# Patient Record
Sex: Male | Born: 1970
Health system: Southern US, Community
[De-identification: ages and names within clinical notes are randomized; demographics above are authoritative.]

## PROBLEM LIST (undated history)

## (undated) DIAGNOSIS — E785 Hyperlipidemia, unspecified: Secondary | ICD-10-CM

## (undated) DIAGNOSIS — B019 Varicella without complication: Secondary | ICD-10-CM

## (undated) DIAGNOSIS — G4730 Sleep apnea, unspecified: Secondary | ICD-10-CM

## (undated) DIAGNOSIS — I10 Essential (primary) hypertension: Secondary | ICD-10-CM

## (undated) DIAGNOSIS — E119 Type 2 diabetes mellitus without complications: Secondary | ICD-10-CM

## (undated) HISTORY — DX: Varicella without complication: B01.9

## (undated) HISTORY — PX: HERNIA REPAIR: SHX51

## (undated) HISTORY — DX: Sleep apnea, unspecified: G47.30

## (undated) HISTORY — DX: Hyperlipidemia, unspecified: E78.5

## (undated) HISTORY — PX: SLIPPED CAPITAL FEMORAL EPIPHYSIS PINNING: SHX391

## (undated) HISTORY — DX: Type 2 diabetes mellitus without complications: E11.9

---

## 2000-08-30 ENCOUNTER — Encounter: Payer: Self-pay | Admitting: Emergency Medicine

## 2000-08-30 ENCOUNTER — Emergency Department (HOSPITAL_COMMUNITY): Admission: EM | Admit: 2000-08-30 | Discharge: 2000-08-30 | Payer: Self-pay | Admitting: Emergency Medicine

## 2012-12-17 ENCOUNTER — Ambulatory Visit (INDEPENDENT_AMBULATORY_CARE_PROVIDER_SITE_OTHER): Payer: BC Managed Care – PPO | Admitting: Emergency Medicine

## 2012-12-17 ENCOUNTER — Ambulatory Visit: Payer: BC Managed Care – PPO

## 2012-12-17 VITALS — BP 160/98 | HR 114 | Temp 98.8°F | Resp 18 | Ht 70.0 in | Wt 261.0 lb

## 2012-12-17 DIAGNOSIS — R059 Cough, unspecified: Secondary | ICD-10-CM

## 2012-12-17 DIAGNOSIS — R05 Cough: Secondary | ICD-10-CM

## 2012-12-17 DIAGNOSIS — J329 Chronic sinusitis, unspecified: Secondary | ICD-10-CM

## 2012-12-17 LAB — POCT RAPID STREP A (OFFICE): Rapid Strep A Screen: NEGATIVE

## 2012-12-17 MED ORDER — BENZONATATE 100 MG PO CAPS: 100.0000 mg | ORAL_CAPSULE | Freq: Three times a day (TID) | ORAL | Status: DC | PRN

## 2012-12-17 MED ORDER — AZITHROMYCIN 250 MG PO TABS: ORAL_TABLET | ORAL | Status: DC

## 2012-12-17 MED ORDER — FLUTICASONE PROPIONATE 50 MCG/ACT NA SUSP: 2.0000 | Freq: Every day | NASAL | Status: DC

## 2012-12-17 NOTE — Progress Notes (Signed)
  Subjective:    Patient ID: YU PEGGS, male    DOB: [birth year], 41 y.o.   MRN: [id number]  HPI  41 year old male presents with cough for two weeks.  Had a dry cough, runny nose producing clear mucus.  Other than cough does not feel bad.  Never had allergies before.  No health problems other than hypertension.  Has been taking delsum.  No one else in the home has been sick.      Review of Systems     Objective:   Physical Exam HEENT exam. TMs are clear. Turbinates are swollen bilaterally. There is a septal deviation present. His throat is clear. Neck is supple breath sounds are symmetrical without wheezes and cardiac exam is regular rate and rhythm without murmurs  [facility] reading (PRIMARY) by  Dr. Cleta Alberts is a 1 x 1 cm radiodense area in the right maxillary sinus question retention cyst versus bony lesion. Please comment there is some thickening of the walls of this maxillary sinuses but no air-fluid levels the  Results for orders placed in visit on 12/17/12  POCT RAPID STREP A (OFFICE)      Component Value Range   Rapid Strep A Screen Negative  Negative       Assessment & Plan:  Patient presents with a two-week history of sinus congestion at times yellowish nasal drainage associated with a cough. We'll treat with Z-Pak along with Flonase and Tessalon Perles for cough

## 2012-12-17 NOTE — Patient Instructions (Addendum)
Sinusitis Sinusitis is redness, soreness, and swelling (inflammation) of the paranasal sinuses. Paranasal sinuses are air pockets within the bones of your face (beneath the eyes, the middle of the forehead, or above the eyes). In healthy paranasal sinuses, mucus is able to drain out, and air is able to circulate through them by way of your nose. However, when your paranasal sinuses are inflamed, mucus and air can become trapped. This can allow bacteria and other germs to grow and cause infection. Sinusitis can develop quickly and last only a short time (acute) or continue over a long period (chronic). Sinusitis that lasts for more than 12 weeks is considered chronic.  CAUSES  Causes of sinusitis include:  Allergies.  Structural abnormalities, such as displacement of the cartilage that separates your nostrils (deviated septum), which can decrease the air flow through your nose and sinuses and affect sinus drainage.  Functional abnormalities, such as when the small hairs (cilia) that line your sinuses and help remove mucus do not work properly or are not present. SYMPTOMS  Symptoms of acute and chronic sinusitis are the same. The primary symptoms are pain and pressure around the affected sinuses. Other symptoms include:  Upper toothache.  Earache.  Headache.  Bad breath.  Decreased sense of smell and taste.  A cough, which worsens when you are lying flat.  Fatigue.  Fever.  Thick drainage from your nose, which often is green and may contain pus (purulent).  Swelling and warmth over the affected sinuses. DIAGNOSIS  Your caregiver will perform a physical exam. During the exam, your caregiver may:  Look in your nose for signs of abnormal growths in your nostrils (nasal polyps).  Tap over the affected sinus to check for signs of infection.  View the inside of your sinuses (endoscopy) with a special imaging device with a light attached (endoscope), which is inserted into your  sinuses. If your caregiver suspects that you have chronic sinusitis, one or more of the following tests may be recommended:  Allergy tests.  Nasal culture A sample of mucus is taken from your nose and sent to a lab and screened for bacteria.  Nasal cytology A sample of mucus is taken from your nose and examined by your caregiver to determine if your sinusitis is related to an allergy. TREATMENT  Most cases of acute sinusitis are related to a viral infection and will resolve on their own within 10 days. Sometimes medicines are prescribed to help relieve symptoms (pain medicine, decongestants, nasal steroid sprays, or saline sprays).  However, for sinusitis related to a bacterial infection, your caregiver will prescribe antibiotic medicines. These are medicines that will help kill the bacteria causing the infection.  Rarely, sinusitis is caused by a fungal infection. In theses cases, your caregiver will prescribe antifungal medicine. For some cases of chronic sinusitis, surgery is needed. Generally, these are cases in which sinusitis recurs more than 3 times per year, despite other treatments. HOME CARE INSTRUCTIONS   Drink plenty of water. Water helps thin the mucus so your sinuses can drain more easily.  Use a humidifier.  Inhale steam 3 to 4 times a day (for example, sit in the bathroom with the shower running).  Apply a warm, moist washcloth to your face 3 to 4 times a day, or as directed by your caregiver.  Use saline nasal sprays to help moisten and clean your sinuses.  Take over-the-counter or prescription medicines for pain, discomfort, or fever only as directed by your caregiver. SEEK IMMEDIATE MEDICAL   CARE IF:  You have increasing pain or severe headaches.  You have nausea, vomiting, or drowsiness.  You have swelling around your face.  You have vision problems.  You have a stiff neck.  You have difficulty breathing. MAKE SURE YOU:   Understand these  instructions.  Will watch your condition.  Will get help right away if you are not doing well or get worse. Document Released: 12/13/2005 Document Revised: 03/06/2012 Document Reviewed: 12/28/2011 [facility] Patient Information 2013 [place], [place]. Cough, Adult  A cough is a reflex that helps clear your throat and airways. It can help heal the body or may be a reaction to an irritated airway. A cough may only last 2 or 3 weeks (acute) or may last more than 8 weeks (chronic).  CAUSES Acute cough:  Viral or bacterial infections. Chronic cough:  Infections.  Allergies.  Asthma.  Post-nasal drip.  Smoking.  Heartburn or acid reflux.  Some medicines.  Chronic lung problems (COPD).  Cancer. SYMPTOMS   Cough.  Fever.  Chest pain.  Increased breathing rate.  High-pitched whistling sound when breathing (wheezing).  Colored mucus that you cough up (sputum). TREATMENT   A bacterial cough may be treated with antibiotic medicine.  A viral cough must run its course and will not respond to antibiotics.  Your caregiver may recommend other treatments if you have a chronic cough. HOME CARE INSTRUCTIONS   Only take over-the-counter or prescription medicines for pain, discomfort, or fever as directed by your caregiver. Use cough suppressants only as directed by your caregiver.  Use a cold steam vaporizer or humidifier in your bedroom or home to help loosen secretions.  Sleep in a semi-upright position if your cough is worse at night.  Rest as needed.  Stop smoking if you smoke. SEEK IMMEDIATE MEDICAL CARE IF:   You have pus in your sputum.  Your cough starts to worsen.  You cannot control your cough with suppressants and are losing sleep.  You begin coughing up blood.  You have difficulty breathing.  You develop pain which is getting worse or is uncontrolled with medicine.  You have a fever. MAKE SURE YOU:   Understand these instructions.  Will watch your  condition.  Will get help right away if you are not doing well or get worse. Document Released: 06/11/2011 Document Revised: 03/06/2012 Document Reviewed: 06/11/2011 [facility] Patient Information 2013 [place], [place].

## 2014-04-09 ENCOUNTER — Ambulatory Visit (INDEPENDENT_AMBULATORY_CARE_PROVIDER_SITE_OTHER): Payer: BC Managed Care – PPO | Admitting: Physician Assistant

## 2014-04-09 VITALS — BP 150/90 | HR 110 | Temp 98.8°F | Resp 16 | Ht 70.5 in | Wt 275.0 lb

## 2014-04-09 DIAGNOSIS — J302 Other seasonal allergic rhinitis: Secondary | ICD-10-CM

## 2014-04-09 DIAGNOSIS — J019 Acute sinusitis, unspecified: Secondary | ICD-10-CM

## 2014-04-09 DIAGNOSIS — J309 Allergic rhinitis, unspecified: Secondary | ICD-10-CM

## 2014-04-09 DIAGNOSIS — I152 Hypertension secondary to endocrine disorders: Secondary | ICD-10-CM | POA: Insufficient documentation

## 2014-04-09 DIAGNOSIS — R059 Cough, unspecified: Secondary | ICD-10-CM

## 2014-04-09 DIAGNOSIS — I10 Essential (primary) hypertension: Secondary | ICD-10-CM

## 2014-04-09 DIAGNOSIS — J329 Chronic sinusitis, unspecified: Secondary | ICD-10-CM

## 2014-04-09 DIAGNOSIS — R05 Cough: Secondary | ICD-10-CM

## 2014-04-09 MED ORDER — GUAIFENESIN ER 1200 MG PO TB12
1.0000 | ORAL_TABLET | Freq: Two times a day (BID) | ORAL | Status: AC
Start: 2014-04-09 — End: 2014-04-16

## 2014-04-09 MED ORDER — FLUTICASONE PROPIONATE 50 MCG/ACT NA SUSP: 2.0000 | Freq: Every day | NASAL | Status: DC

## 2014-04-09 MED ORDER — AMOXICILLIN 875 MG PO TABS: 1750.0000 mg | ORAL_TABLET | Freq: Two times a day (BID) | ORAL | Status: DC

## 2014-04-09 MED ORDER — HYDROCOD POLST-CHLORPHEN POLST 10-8 MG/5ML PO LQCR: 5.0000 mL | Freq: Two times a day (BID) | ORAL | Status: AC

## 2014-04-09 NOTE — Patient Instructions (Signed)
Mucinex - blue box

## 2014-04-09 NOTE — Progress Notes (Signed)
   Subjective:    Patient ID: Chris Watkins, male    DOB: 26-Mar-1971, 43 y.o.   MRN: [id number]  HPI Pt presents to clinic with sinus pressure and congestion for the last about 2 weeks.  It started with what he thought was a cold but it has not gotten better.  He has taken OTC medications intermittently but is now concerned because he now has a cough that is keeping him up at night and he has yellow rhinorrhea. He is having facial pressure but no teeth pain or headaches.  OTC meds - OTC sinus meds, saline washes  Review of Systems  Constitutional: Negative for fever and chills.  HENT: Positive for congestion, postnasal drip, rhinorrhea (yellow) and sore throat.   Respiratory: Positive for cough (dry).        Objective:   Physical Exam  Vitals reviewed. Constitutional: He is oriented to person, place, and time. He appears well-developed and well-nourished.  HENT:  Head: Normocephalic and atraumatic.  Right Ear: Hearing, tympanic membrane, external ear and ear canal normal.  Left Ear: Hearing, tympanic membrane, external ear and ear canal normal.  Nose: Mucosal edema (pale) present.  Mouth/Throat: Uvula is midline, oropharynx is clear and moist and mucous membranes are normal.  Eyes: Conjunctivae are normal.  Neck: Normal range of motion.  Cardiovascular: Normal rate, regular rhythm and normal heart sounds.   No murmur heard. Pulmonary/Chest: Effort normal and breath sounds normal. He has no wheezes.  Lymphadenopathy:    He has no cervical adenopathy.  Neurological: He is alert and oriented to person, place, and time.  Skin: Skin is warm and dry.  Psychiatric: He has a normal mood and affect. His behavior is normal. Judgment and thought content normal.       Assessment & Plan:  HTN (hypertension)  Sinus infection - Plan: amoxicillin (AMOXIL) 875 MG tablet, Guaifenesin (MUCINEX MAXIMUM STRENGTH) 1200 MG TB12  Seasonal allergies  Cough - Plan: chlorpheniramine-HYDROcodone  (TUSSIONEX PENNKINETIC ER) 10-8 MG/5ML LQCR  Sinusitis - Plan: fluticasone (FLONASE) 50 MCG/ACT nasal spray  Windell Hummingbird PA-C  [facility] 04/09/2014 10:53 PM

## 2015-01-27 ENCOUNTER — Emergency Department (HOSPITAL_COMMUNITY)
Admission: EM | Admit: 2015-01-27 | Discharge: 2015-01-27 | Disposition: A | Discharge status: Home / Self Care | Payer: BLUE CROSS/BLUE SHIELD | Attending: Emergency Medicine | Admitting: Emergency Medicine

## 2015-01-27 ENCOUNTER — Ambulatory Visit (INDEPENDENT_AMBULATORY_CARE_PROVIDER_SITE_OTHER): Payer: BLUE CROSS/BLUE SHIELD | Admitting: Emergency Medicine

## 2015-01-27 ENCOUNTER — Encounter (HOSPITAL_COMMUNITY): Payer: Self-pay | Admitting: Neurology

## 2015-01-27 VITALS — BP 158/80 | HR 105 | Temp 98.0°F | Resp 18 | Ht 71.0 in | Wt 265.0 lb

## 2015-01-27 DIAGNOSIS — R000 Tachycardia, unspecified: Secondary | ICD-10-CM | POA: Diagnosis not present

## 2015-01-27 DIAGNOSIS — Z91199 Patient's noncompliance with other medical treatment and regimen due to unspecified reason: Secondary | ICD-10-CM

## 2015-01-27 DIAGNOSIS — I10 Essential (primary) hypertension: Secondary | ICD-10-CM

## 2015-01-27 DIAGNOSIS — Z79899 Other long term (current) drug therapy: Secondary | ICD-10-CM | POA: Diagnosis not present

## 2015-01-27 DIAGNOSIS — E119 Type 2 diabetes mellitus without complications: Secondary | ICD-10-CM

## 2015-01-27 DIAGNOSIS — R5383 Other fatigue: Secondary | ICD-10-CM | POA: Diagnosis not present

## 2015-01-27 DIAGNOSIS — Z720 Tobacco use: Secondary | ICD-10-CM | POA: Insufficient documentation

## 2015-01-27 DIAGNOSIS — E1165 Type 2 diabetes mellitus with hyperglycemia: Secondary | ICD-10-CM | POA: Diagnosis not present

## 2015-01-27 DIAGNOSIS — Z9119 Patient's noncompliance with other medical treatment and regimen: Secondary | ICD-10-CM

## 2015-01-27 DIAGNOSIS — R739 Hyperglycemia, unspecified: Secondary | ICD-10-CM | POA: Diagnosis present

## 2015-01-27 HISTORY — DX: Essential (primary) hypertension: I10

## 2015-01-27 LAB — POCT URINALYSIS DIPSTICK
Bilirubin, UA: NEGATIVE
Blood, UA: NEGATIVE
Glucose, UA: 500
KETONES UA: 40
LEUKOCYTES UA: NEGATIVE
Nitrite, UA: NEGATIVE
PROTEIN UA: NEGATIVE
Urobilinogen, UA: 0.2
pH, UA: 5

## 2015-01-27 LAB — URINALYSIS, ROUTINE W REFLEX MICROSCOPIC
BILIRUBIN URINE: NEGATIVE
HGB URINE DIPSTICK: NEGATIVE
Ketones, ur: 40 mg/dL — AB
Leukocytes, UA: NEGATIVE
Nitrite: NEGATIVE
PH: 5 (ref 5.0–8.0)
Protein, ur: NEGATIVE mg/dL
SPECIFIC GRAVITY, URINE: 1.037 — AB (ref 1.005–1.030)
UROBILINOGEN UA: 0.2 mg/dL (ref 0.0–1.0)

## 2015-01-27 LAB — COMPREHENSIVE METABOLIC PANEL
ALK PHOS: 144 U/L — AB (ref 39–117)
ALT: 27 U/L (ref 0–53)
ANION GAP: 18 — AB (ref 5–15)
AST: 45 U/L — AB (ref 0–37)
Albumin: 4.1 g/dL (ref 3.5–5.2)
BUN: 16 mg/dL (ref 6–23)
CALCIUM: 9.7 mg/dL (ref 8.4–10.5)
CHLORIDE: 88 mmol/L — AB (ref 96–112)
CO2: 21 mmol/L (ref 19–32)
Creatinine, Ser: 1.22 mg/dL (ref 0.50–1.35)
GFR, EST AFRICAN AMERICAN: 82 mL/min — AB (ref >90)
GFR, EST NON AFRICAN AMERICAN: 71 mL/min — AB (ref >90)
GLUCOSE: 571 mg/dL — AB (ref 70–99)
POTASSIUM: 5.8 mmol/L — AB (ref 3.5–5.1)
Sodium: 127 mmol/L — ABNORMAL LOW (ref 135–145)
Total Bilirubin: 2.8 mg/dL — ABNORMAL HIGH (ref 0.3–1.2)
Total Protein: 7.8 g/dL (ref 6.0–8.3)

## 2015-01-27 LAB — BASIC METABOLIC PANEL
Anion gap: 18 — ABNORMAL HIGH (ref 5–15)
BUN: 17 mg/dL (ref 6–23)
CO2: 20 mmol/L (ref 19–32)
Calcium: 9.7 mg/dL (ref 8.4–10.5)
Chloride: 89 mmol/L — ABNORMAL LOW (ref 96–112)
Creatinine, Ser: 1.13 mg/dL (ref 0.50–1.35)
GFR calc non Af Amer: 77 mL/min — ABNORMAL LOW (ref >90)
GFR, EST AFRICAN AMERICAN: 90 mL/min — AB (ref >90)
GLUCOSE: 511 mg/dL — AB (ref 70–99)
POTASSIUM: 5.5 mmol/L — AB (ref 3.5–5.1)
Sodium: 127 mmol/L — ABNORMAL LOW (ref 135–145)

## 2015-01-27 LAB — I-STAT CHEM 8, ED
BUN: 14 mg/dL (ref 6–23)
CALCIUM ION: 1.08 mmol/L — AB (ref 1.12–1.23)
CHLORIDE: 100 mmol/L (ref 96–112)
CREATININE: 0.6 mg/dL (ref 0.50–1.35)
GLUCOSE: 336 mg/dL — AB (ref 70–99)
HCT: 45 % (ref 39.0–52.0)
Hemoglobin: 15.3 g/dL (ref 13.0–17.0)
Potassium: 4 mmol/L (ref 3.5–5.1)
Sodium: 136 mmol/L (ref 135–145)
TCO2: 19 mmol/L (ref 0–100)

## 2015-01-27 LAB — CBC
HCT: 43.2 % (ref 39.0–52.0)
Hemoglobin: 15.2 g/dL (ref 13.0–17.0)
MCH: 29.6 pg (ref 26.0–34.0)
MCHC: 35.2 g/dL (ref 30.0–36.0)
MCV: 84 fL (ref 78.0–100.0)
Platelets: 161 10*3/uL (ref 150–400)
RBC: 5.14 MIL/uL (ref 4.22–5.81)
RDW: 13.5 % (ref 11.5–15.5)
WBC: 5.4 10*3/uL (ref 4.0–10.5)

## 2015-01-27 LAB — URINE MICROSCOPIC-ADD ON

## 2015-01-27 LAB — CBG MONITORING, ED
GLUCOSE-CAPILLARY: 555 mg/dL — AB (ref 70–99)
GLUCOSE-CAPILLARY: 366 mg/dL — AB (ref 70–99)
Glucose-Capillary: 526 mg/dL — ABNORMAL HIGH (ref 70–99)

## 2015-01-27 LAB — POCT CBC
Granulocyte percent: 63.8 %G (ref 37–80)
HEMATOCRIT: 46.8 % (ref 43.5–53.7)
Hemoglobin: 15.5 g/dL (ref 14.1–18.1)
Lymph, poc: 1.8 (ref 0.6–3.4)
MCH, POC: 28.5 pg (ref 27–31.2)
MCHC: 33 g/dL (ref 31.8–35.4)
MCV: 86.3 fL (ref 80–97)
MID (CBC): 0.2 (ref 0–0.9)
MPV: 10.5 fL (ref 0–99.8)
POC Granulocyte: 3.5 (ref 2–6.9)
POC LYMPH %: 32.7 % (ref 10–50)
POC MID %: 3.5 %M (ref 0–12)
Platelet Count, POC: 158 10*3/uL (ref 142–424)
RBC: 5.43 M/uL (ref 4.69–6.13)
RDW, POC: 14.6 %
WBC: 5.5 10*3/uL (ref 4.6–10.2)

## 2015-01-27 LAB — POCT UA - MICROSCOPIC ONLY
BACTERIA, U MICROSCOPIC: 0
CASTS, UR, LPF, POC: 0
CRYSTALS, UR, HPF, POC: 0
Epithelial cells, urine per micros: 0
MUCUS UA: 0
RBC, urine, microscopic: 0
WBC, Ur, HPF, POC: 0
Yeast, UA: 0

## 2015-01-27 LAB — GLUCOSE, POCT (MANUAL RESULT ENTRY): POC Glucose: >444 mg/dl (ref 70–99)

## 2015-01-27 LAB — POCT GLYCOSYLATED HEMOGLOBIN (HGB A1C): HEMOGLOBIN A1C: 12.3

## 2015-01-27 MED ORDER — SODIUM CHLORIDE 0.9 % IV SOLN
1000.0000 mL | Freq: Once | INTRAVENOUS | Status: AC
  Administered 2015-01-27: 1000 mL via INTRAVENOUS

## 2015-01-27 MED ORDER — AMLODIPINE BESYLATE 10 MG PO TABS: 10.0000 mg | ORAL_TABLET | Freq: Every day | ORAL | Status: DC

## 2015-01-27 MED ORDER — GLIMEPIRIDE 4 MG PO TABS: 8.0000 mg | ORAL_TABLET | Freq: Every day | ORAL | Status: DC

## 2015-01-27 MED ORDER — METFORMIN HCL ER (OSM) 500 MG PO TB24: ORAL_TABLET | ORAL | Status: DC

## 2015-01-27 MED ORDER — INSULIN ASPART 100 UNIT/ML ~~LOC~~ SOLN
5.0000 [IU] | Freq: Once | SUBCUTANEOUS | Status: AC
  Administered 2015-01-27: 5 [IU] via INTRAVENOUS
  Filled 2015-01-27: qty 1

## 2015-01-27 MED ORDER — INSULIN ASPART 100 UNIT/ML ~~LOC~~ SOLN
10.0000 [IU] | Freq: Once | SUBCUTANEOUS | Status: AC
  Administered 2015-01-27: 10 [IU] via INTRAVENOUS
  Filled 2015-01-27: qty 1

## 2015-01-27 MED ORDER — FREESTYLE SYSTEM KIT: PACK | Status: DC

## 2015-01-27 MED ORDER — SODIUM CHLORIDE 0.9 % IV SOLN
1000.0000 mL | INTRAVENOUS | Status: DC
  Administered 2015-01-27: 1000 mL via INTRAVENOUS

## 2015-01-27 NOTE — ED Provider Notes (Signed)
CSN: [id number]     Arrival date & time 01/27/15  1426 History   First MD Initiated Contact with Patient 01/27/15 1628     Chief Complaint  Patient presents with  . Hyperglycemia     (Consider location/radiation/quality/duration/timing/severity/associated sxs/prior Treatment) HPI Comments: Patient with past medical history of diabetes, and hypertension presents emergency department with chief complaint of hyperglycemia and fatigue. Patient states that he has been feeling tired for the past day or so. States that he had his blood sugar checked today at work, and it was greater than 500. He reports polyuria and polydipsia. He states that he has not been compliant in taking his diabetes medications. He denies any fevers, chills, nausea, vomiting, diarrhea, or constipation. There are no aggravating or alleviating factors. Patient states that he is diagnosed diabetes last year. He denies doing anything about it.  The history is provided by the patient. No language interpreter was used.    Past Medical History  Diagnosis Date  . Diabetes mellitus without complication   . Hypertension    Past Surgical History  Procedure Laterality Date  . Hernia repair     No family history on file. History  Substance Use Topics  . Smoking status: Current Every Day Smoker -- 0.50 packs/day    Types: Cigarettes  . Smokeless tobacco: Not on file  . Alcohol Use: Yes    Review of Systems  Constitutional: Positive for fatigue. Negative for fever and chills.  Respiratory: Negative for shortness of breath.   Cardiovascular: Negative for chest pain.  Gastrointestinal: Negative for nausea, vomiting, diarrhea and constipation.  Genitourinary: Negative for dysuria.  All other systems reviewed and are negative.     Allergies  Review of patient's allergies indicates no known allergies.  Home Medications   Prior to Admission medications   Medication Sig Start Date End Date Taking? Authorizing Provider   amLODipine (NORVASC) 10 MG tablet Take 1 tablet (10 mg total) by mouth daily. 01/27/15   Roselee Culver, MD  glimepiride (AMARYL) 4 MG tablet Take 2 tablets (8 mg total) by mouth daily before breakfast. 01/27/15   Roselee Culver, MD  glucose monitoring kit (FREESTYLE) monitoring kit Check fasting BS daily   Dispense 100 lancets and test strips 01/27/15   Roselee Culver, MD  hydrochlorothiazide (HYDRODIURIL) 25 MG tablet Take 25 mg by mouth daily.    Historical Provider, MD  metformin (FORTAMET) 500 MG (OSM) 24 hr tablet Two tabs with evening meal this week.   Three next week and forever 4. 01/27/15   Roselee Culver, MD  metFORMIN (GLUCOPHAGE) 500 MG tablet Take 500 mg by mouth 2 (two) times daily with a meal.    Historical Provider, MD  quinapril (ACCUPRIL) 40 MG tablet Take 40 mg by mouth at bedtime.    Historical Provider, MD   BP 161/99 mmHg  Pulse 110  Temp(Src) 98.4 F (36.9 C) (Oral)  Resp 20  SpO2 96% Physical Exam  Constitutional: He is oriented to person, place, and time. He appears well-developed and well-nourished.  HENT:  Head: Normocephalic and atraumatic.  Eyes: Conjunctivae and EOM are normal. Pupils are equal, round, and reactive to light. Right eye exhibits no discharge. Left eye exhibits no discharge. No scleral icterus.  Neck: Normal range of motion. Neck supple. No JVD present.  Cardiovascular: Regular rhythm and normal heart sounds.  Exam reveals no gallop and no friction rub.   No murmur heard. Tachycardic  Pulmonary/Chest: Effort normal and breath sounds  normal. No respiratory distress. He has no wheezes. He has no rales. He exhibits no tenderness.  Abdominal: Soft. He exhibits no distension and no mass. There is no tenderness. There is no rebound and no guarding.  No focal abdominal tenderness, no RLQ tenderness or pain at McBurney's point, no RUQ tenderness or Murphy's sign, no left-sided abdominal tenderness, no fluid wave, or signs of peritonitis    Musculoskeletal: Normal range of motion. He exhibits no edema or tenderness.  Neurological: He is alert and oriented to person, place, and time.  Skin: Skin is warm and dry.  Psychiatric: He has a normal mood and affect. His behavior is normal. Judgment and thought content normal.  Nursing note and vitals reviewed.   ED Course  Procedures (including critical care time) Results for orders placed or performed during the hospital encounter of 01/27/15  CBC  Result Value Ref Range   WBC 5.4 4.0 - 10.5 K/uL   RBC 5.14 4.22 - 5.81 MIL/uL   Hemoglobin 15.2 13.0 - 17.0 g/dL   HCT 43.2 39.0 - 52.0 %   MCV 84.0 78.0 - 100.0 fL   MCH 29.6 26.0 - 34.0 pg   MCHC 35.2 30.0 - 36.0 g/dL   RDW 13.5 11.5 - 15.5 %   Platelets 161 150 - 400 K/uL  Comprehensive metabolic panel  Result Value Ref Range   Sodium 127 (L) 135 - 145 mmol/L   Potassium 5.8 (H) 3.5 - 5.1 mmol/L   Chloride 88 (L) 96 - 112 mmol/L   CO2 21 19 - 32 mmol/L   Glucose, Bld 571 (HH) 70 - 99 mg/dL   BUN 16 6 - 23 mg/dL   Creatinine, Ser 1.22 0.50 - 1.35 mg/dL   Calcium 9.7 8.4 - 10.5 mg/dL   Total Protein 7.8 6.0 - 8.3 g/dL   Albumin 4.1 3.5 - 5.2 g/dL   AST 45 (H) 0 - 37 U/L   ALT 27 0 - 53 U/L   Alkaline Phosphatase 144 (H) 39 - 117 U/L   Total Bilirubin 2.8 (H) 0.3 - 1.2 mg/dL   GFR calc non Af Amer 71 (L) >90 mL/min   GFR calc Af Amer 82 (L) >90 mL/min   Anion gap 18 (H) 5 - 15  Urinalysis, Routine w reflex microscopic  Result Value Ref Range   Color, Urine YELLOW YELLOW   APPearance CLEAR CLEAR   Specific Gravity, Urine 1.037 (H) 1.005 - 1.030   pH 5.0 5.0 - 8.0   Glucose, UA >1000 (A) NEGATIVE mg/dL   Hgb urine dipstick NEGATIVE NEGATIVE   Bilirubin Urine NEGATIVE NEGATIVE   Ketones, ur 40 (A) NEGATIVE mg/dL   Protein, ur NEGATIVE NEGATIVE mg/dL   Urobilinogen, UA 0.2 0.0 - 1.0 mg/dL   Nitrite NEGATIVE NEGATIVE   Leukocytes, UA NEGATIVE NEGATIVE  Urine microscopic-add on  Result Value Ref Range   Squamous  Epithelial / LPF RARE RARE   WBC, UA 0-2 <3 WBC/hpf   RBC / HPF 0-2 <3 RBC/hpf   Bacteria, UA RARE RARE   Casts HYALINE CASTS (A) NEGATIVE  Basic metabolic panel  Result Value Ref Range   Sodium 127 (L) 135 - 145 mmol/L   Potassium 5.5 (H) 3.5 - 5.1 mmol/L   Chloride 89 (L) 96 - 112 mmol/L   CO2 20 19 - 32 mmol/L   Glucose, Bld 511 (H) 70 - 99 mg/dL   BUN 17 6 - 23 mg/dL   Creatinine, Ser 1.13 0.50 - 1.35 mg/dL  Calcium 9.7 8.4 - 10.5 mg/dL   GFR calc non Af Amer 77 (L) >90 mL/min   GFR calc Af Amer 90 (L) >90 mL/min   Anion gap 18 (H) 5 - 15  CBG monitoring, ED  Result Value Ref Range   Glucose-Capillary 555 (HH) 70 - 99 mg/dL   Comment 1 Documented in Chart   CBG monitoring, ED  Result Value Ref Range   Glucose-Capillary 526 (H) 70 - 99 mg/dL   Comment 1 Confirm Test in Lab   CBG monitoring, ED  Result Value Ref Range   Glucose-Capillary 366 (H) 70 - 99 mg/dL  I-stat chem 8, ed  Result Value Ref Range   Sodium 136 135 - 145 mmol/L   Potassium 4.0 3.5 - 5.1 mmol/L   Chloride 100 96 - 112 mmol/L   BUN 14 6 - 23 mg/dL   Creatinine, Ser 0.60 0.50 - 1.35 mg/dL   Glucose, Bld 336 (H) 70 - 99 mg/dL   Calcium, Ion 1.08 (L) 1.12 - 1.23 mmol/L   TCO2 19 0 - 100 mmol/L   Hemoglobin 15.3 13.0 - 17.0 g/dL   HCT 45.0 39.0 - 52.0 %   No results found.    EKG Interpretation None      MDM   Final diagnoses:  Hyperglycemia due to type 2 diabetes mellitus    Patient with hyperglycemia secondary to noncompliance.  Sodium is 127, potassium 5.8, chloride 88. Anion gap is 18, bicarbonate level is 21. I doubt DKA. Will give fluids and insulin. Will recheck potassium. Anticipate controlling sugar, and reassessing electrolytes. If trending in the right direction, the patient appears stable and likely be well enough for discharge to home with outpatient follow-up. Of note, patient was seen in urgent care today, and was started on Amaryl as well as increasing his metformin.  8:29  PM Serial CBGs are improving with fluids and insulin.  Electrolytes have normalized.  Patient is feeling better.  Will discharge to home with strict return precautions.  Patient is to increase his metformin and start taking amaryl prescribed by PCP. Patient discussed with Dr. Tawnya Crook, who agrees with plan.  Montine Circle, PA-C 01/27/15 2147  Ernestina Patches, MD 01/28/15 [phone]

## 2015-01-27 NOTE — ED Notes (Signed)
Pt reports blood sugar > 500 today and feels that his mouth is dry and has been urinating a lot. Denies pain is a x4

## 2015-01-27 NOTE — Progress Notes (Signed)
[facility] [address] [place] [address] [phone]  Date:  01/27/2015   Name:  Chris Watkins   DOB:  07-15-1971   MRN:  [id number]  PCP:  Stanford Scotland, NP    Chief Complaint: Hyperglycemia   History of Present Illness:  Chris Watkins is a 44 y.o. very pleasant male patient who presents with the following:  Found to have elevated sugar and failed to take metformin in 09/2013 This November started metformin 500 daily He is polyuric and fatigued now.  Had interval where he failed again to his metformin since before the "holidays" until last week. Is polyuric and thirsty. Feels very tired. Weight is up Has very poor understanding of dangers of untreated diabetes and almost no insight into the disease. No improvement with over the counter medications or other home remedies. No improvement with over the counter medications or other home remedies.  Denies other complaint or health concern today.  Patient Active Problem List   Diagnosis Date Noted  . HTN (hypertension) 04/09/2014    Past Medical History  Diagnosis Date  . Diabetes mellitus without complication     Past Surgical History  Procedure Laterality Date  . Hernia repair      History  Substance Use Topics  . Smoking status: Current Every Day Smoker -- 0.50 packs/day    Types: Cigarettes  . Smokeless tobacco: Not on file  . Alcohol Use: Not on file    History reviewed. No pertinent family history.  No Known Allergies  Medication list has been reviewed and updated.  Current Outpatient Prescriptions on File Prior to Visit  Medication Sig Dispense Refill  . amLODipine (NORVASC) 5 MG tablet Take 5 mg by mouth daily.    . hydrochlorothiazide (HYDRODIURIL) 25 MG tablet Take 25 mg by mouth daily.    . quinapril (ACCUPRIL) 40 MG tablet Take 40 mg by mouth at bedtime.     No current facility-administered medications on file prior to visit.    Review of Systems:  As per HPI,  otherwise negative.    Physical Examination: Filed Vitals:   01/27/15 1201  BP: 158/80  Pulse: 105  Temp: 98 F (36.7 C)  Resp: 18   Filed Vitals:   01/27/15 1201  Height: 5\' 11"  (1.803 m)  Weight: 265 lb (120.203 kg)   Body mass index is 36.98 kg/(m^2). Ideal Body Weight: Weight in (lb) to have BMI = 25: 178.9  GEN: obese, NAD, Non-toxic, A & O x 3 HEENT: Atraumatic, Normocephalic. Neck supple. No masses, No LAD.  Oropharynx dry Ears and Nose: No external deformity. CV: RRR, No M/G/R. No JVD. No thrill. No extra heart sounds. PULM: CTA B, no wheezes, crackles, rhonchi. No retractions. No resp. distress. No accessory muscle use. ABD: S, NT, ND, +BS. No rebound. No HSM. EXTR: No c/c/e NEURO Normal gait.  PSYCH: Normally interactive. Conversant. Not depressed or anxious appearing.  Calm demeanor.    Assessment and Plan: Uncontrolled NIDDM Noncompliance Hypertension ER for management Increase metformin Add glimiperide   Signed,  Ellison Carwin, MD   Results for orders placed or performed in visit on 01/27/15  POCT CBC  Result Value Ref Range   WBC 5.5 4.6 - 10.2 K/uL   Lymph, poc 1.8 0.6 - 3.4   POC LYMPH PERCENT 32.7 10 - 50 %L   MID (cbc) 0.2 0 - 0.9   POC MID % 3.5 0 - 12 %M   POC Granulocyte 3.5  2 - 6.9   Granulocyte percent 63.8 37 - 80 %G   RBC 5.43 4.69 - 6.13 M/uL   Hemoglobin 15.5 14.1 - 18.1 g/dL   HCT, POC 46.8 43.5 - 53.7 %   MCV 86.3 80 - 97 fL   MCH, POC 28.5 27 - 31.2 pg   MCHC 33.0 31.8 - 35.4 g/dL   RDW, POC 14.6 %   Platelet Count, POC 158 142 - 424 K/uL   MPV 10.5 0 - 99.8 fL  POCT UA - Microscopic Only  Result Value Ref Range   WBC, Ur, HPF, POC 0    RBC, urine, microscopic 0    Bacteria, U Microscopic 0    Mucus, UA 0    Epithelial cells, urine per micros 0    Crystals, Ur, HPF, POC 0    Casts, Ur, LPF, POC 0    Yeast, UA 0   POCT urinalysis dipstick  Result Value Ref Range   Color, UA yellow    Clarity, UA clear     Glucose, UA 500    Bilirubin, UA neg    Ketones, UA 40    Spec Grav, UA <=1.005    Blood, UA neg    pH, UA 5.0    Protein, UA neg    Urobilinogen, UA 0.2    Nitrite, UA neg    Leukocytes, UA Negative   POCT glycosylated hemoglobin (Hb A1C)  Result Value Ref Range   Hemoglobin A1C 12.3   POCT glucose (manual entry)  Result Value Ref Range   POC Glucose >444 70 - 99 mg/dl

## 2015-01-27 NOTE — Patient Instructions (Signed)
Type 2 Diabetes Mellitus Type 2 diabetes mellitus, often simply referred to as type 2 diabetes, is a long-lasting (chronic) disease. In type 2 diabetes, the pancreas does not make enough insulin (a hormone), the cells are less responsive to the insulin that is made (insulin resistance), or both. Normally, insulin moves sugars from food into the tissue cells. The tissue cells use the sugars for energy. The lack of insulin or the lack of normal response to insulin causes excess sugars to build up in the blood instead of going into the tissue cells. As a result, high blood sugar (hyperglycemia) develops. The effect of high sugar (glucose) levels can cause many complications. Type 2 diabetes was also previously called adult-onset diabetes, but it can occur at any age.  RISK FACTORS  A person is predisposed to developing type 2 diabetes if someone in the family has the disease and also has one or more of the following primary risk factors:  Overweight.  An inactive lifestyle.  A history of consistently eating high-calorie foods. Maintaining a normal weight and regular physical activity can reduce the chance of developing type 2 diabetes. SYMPTOMS  A person with type 2 diabetes may not show symptoms initially. The symptoms of type 2 diabetes appear slowly. The symptoms include:  Increased thirst (polydipsia).  Increased urination (polyuria).  Increased urination during the night (nocturia).  Weight loss. This weight loss may be rapid.  Frequent, recurring infections.  Tiredness (fatigue).  Weakness.  Vision changes, such as blurred vision.  Fruity smell to your breath.  Abdominal pain.  Nausea or vomiting.  Cuts or bruises which are slow to heal.  Tingling or numbness in the hands or feet. DIAGNOSIS Type 2 diabetes is frequently not diagnosed until complications of diabetes are present. Type 2 diabetes is diagnosed when symptoms or complications are present and when blood  glucose levels are increased. Your blood glucose level may be checked by one or more of the following blood tests:  A fasting blood glucose test. You will not be allowed to eat for at least 8 hours before a blood sample is taken.  A random blood glucose test. Your blood glucose is checked at any time of the day regardless of when you ate.  A hemoglobin A1c blood glucose test. A hemoglobin A1c test provides information about blood glucose control over the previous 3 months.  An oral glucose tolerance test (OGTT). Your blood glucose is measured after you have not eaten (fasted) for 2 hours and then after you drink a glucose-containing beverage. TREATMENT   You may need to take insulin or diabetes medicine daily to keep blood glucose levels in the desired range.  If you use insulin, you may need to adjust the dosage depending on the carbohydrates that you eat with each meal or snack. The treatment goal is to maintain the before meal blood sugar (preprandial glucose) level at 70-130 mg/dL. HOME CARE INSTRUCTIONS   Have your hemoglobin A1c level checked twice a year.  Perform daily blood glucose monitoring as directed by your health care provider.  Monitor urine ketones when you are ill and as directed by your health care provider.  Take your diabetes medicine or insulin as directed by your health care provider to maintain your blood glucose levels in the desired range.  Never run out of diabetes medicine or insulin. It is needed every day.  If you are using insulin, you may need to adjust the amount of insulin given based on your intake of   carbohydrates. Carbohydrates can raise blood glucose levels but need to be included in your diet. Carbohydrates provide vitamins, minerals, and fiber which are an essential part of a healthy diet. Carbohydrates are found in fruits, vegetables, whole grains, dairy products, legumes, and foods containing added sugars.  Eat healthy foods. You should make an  appointment to see a registered dietitian to help you create an eating plan that is right for you.  Lose weight if you are overweight.  Carry a medical alert card or wear your medical alert jewelry.  Carry a 15-gram carbohydrate snack with you at all times to treat low blood glucose (hypoglycemia). Some examples of 15-gram carbohydrate snacks include:  Glucose tablets, 3 or 4.  Glucose gel, 15-gram tube.  Raisins, 2 tablespoons (24 grams).  Jelly beans, 6.  Animal crackers, 8.  Regular pop, 4 ounces (120 mL).  Gummy treats, 9.  Recognize hypoglycemia. Hypoglycemia occurs with blood glucose levels of 70 mg/dL and below. The risk for hypoglycemia increases when fasting or skipping meals, during or after intense exercise, and during sleep. Hypoglycemia symptoms can include:  Tremors or shakes.  Decreased ability to concentrate.  Sweating.  Increased heart rate.  Headache.  Dry mouth.  Hunger.  Irritability.  Anxiety.  Restless sleep.  Altered speech or coordination.  Confusion.  Treat hypoglycemia promptly. If you are alert and able to safely swallow, follow the 15:15 rule:  Take 15-20 grams of rapid-acting glucose or carbohydrate. Rapid-acting options include glucose gel, glucose tablets, or 4 ounces (120 mL) of fruit juice, regular soda, or low-fat milk.  Check your blood glucose level 15 minutes after taking the glucose.  Take 15-20 grams more of glucose if the repeat blood glucose level is still 70 mg/dL or below.  Eat a meal or snack within 1 hour once blood glucose levels return to normal.  Be alert to feeling very thirsty and urinating more frequently than usual, which are early signs of hyperglycemia. An early awareness of hyperglycemia allows for prompt treatment. Treat hyperglycemia as directed by your health care provider.  Engage in at least 150 minutes of moderate-intensity physical activity a week, spread over at least 3 days of the week or as  directed by your health care provider. In addition, you should engage in resistance exercise at least 2 times a week or as directed by your health care provider. Try to spend no more than 90 minutes at one time inactive.  Adjust your medicine and food intake as needed if you start a new exercise or sport.  Follow your sick-day plan anytime you are unable to eat or drink as usual.  Do not use any tobacco products including cigarettes, chewing tobacco, or electronic cigarettes. If you need help quitting, ask your health care provider.  Limit alcohol intake to no more than 1 drink per day for nonpregnant women and 2 drinks per day for men. You should drink alcohol only when you are also eating food. Talk with your health care provider whether alcohol is safe for you. Tell your health care provider if you drink alcohol several times a week.  Keep all follow-up visits as directed by your health care provider. This is important.  Schedule an eye exam soon after the diagnosis of type 2 diabetes and then annually.  Perform daily skin and foot care. Examine your skin and feet daily for cuts, bruises, redness, nail problems, bleeding, blisters, or sores. A foot exam by a health care provider should be done annually.    Brush your teeth and gums at least twice a day and floss at least once a day. Follow up with your dentist regularly.  Share your diabetes management plan with your workplace or school.  Stay up-to-date with immunizations. It is recommended that people with diabetes who are over 44 years old get the pneumonia vaccine. In some cases, two separate shots may be given. Ask your health care provider if your pneumonia vaccination is up-to-date.  Learn to manage stress.  Obtain ongoing diabetes education and support as needed.  Participate in or seek rehabilitation as needed to maintain or improve independence and quality of life. Request a physical or occupational therapy referral if you are  having foot or hand numbness, or difficulties with grooming, dressing, eating, or physical activity. SEEK MEDICAL CARE IF:   You are unable to eat food or drink fluids for more than 6 hours.  You have nausea and vomiting for more than 6 hours.  Your blood glucose level is over 240 mg/dL.  There is a change in mental status.  You develop an additional serious illness.  You have diarrhea for more than 6 hours.  You have been sick or have had a fever for a couple of days and are not getting better.  You have pain during any physical activity.  SEEK IMMEDIATE MEDICAL CARE IF:  You have difficulty breathing.  You have moderate to large ketone levels. MAKE SURE YOU:  Understand these instructions.  Will watch your condition.  Will get help right away if you are not doing well or get worse. Document Released: 12/13/2005 Document Revised: 04/29/2014 Document Reviewed: 07/11/2012 [facility] Patient Information 2015 [place], [place]. This information is not intended to replace advice given to you by your health care provider. Make sure you discuss any questions you have with your health care provider. Diabetes, Eating Away From Home Sometimes, you might eat in a restaurant or have meals that are prepared by someone else. You can enjoy eating out. However, the portions in restaurants may be much larger than needed. Listed below are some ideas to help you choose foods that will keep your blood glucose (sugar) in better control.  TIPS FOR EATING OUT  Know your meal plan and how many carbohydrate servings you should have at each meal. You may wish to carry a copy of your meal plan in your purse or wallet. Learn the foods included in each food group.  Make a list of restaurants near you that offer healthy choices. Take a copy of the carry-out menus to see what they offer. Then, you can plan what you will order ahead of time.  Become familiar with serving sizes by practicing them at home using  measuring cups and spoons. Once you learn to recognize portion sizes, you will be able to correctly estimate the amount of total carbohydrate you are allowed to eat at the restaurant. Ask for a takeout box if the portion is more than you should have. When your food comes, leave the amount you should have on the plate, and put the rest in the takeout box before you start eating.  Plan ahead if your mealtime will be different from usual. Check with your caregiver to find out how to time meals and medicine if you are taking insulin.  Avoid high-fat foods, such as fried foods, cream sauces, high-fat salad dressings, or any added butter or margarine.  Do not be afraid to ask questions. Ask your server about the portion size, cooking methods, ingredients  and if items can be substituted. Restaurants do not list all available items on the menu. You can ask for your main entree to be prepared using skim milk, oil instead of butter or margarine, and without gravy or sauces. Ask your waiter or waitress to serve salad dressings, gravy, sauces, margarine, and sour cream on the side. You can then add the amount your meal plan suggests.  Add more vegetables whenever possible.  Avoid items that are labeled "jumbo," "giant," "deluxe," or "supersized."  You may want to split an entre with someone and order an extra side salad.  Watch for hidden calories in foods like croutons, bacon, or cheese.  Ask your server to take away the bread basket or chips from your table.  Order a dinner salad as an appetizer. You can eat most foods served in a restaurant. Some foods are better choices than others. Breads and Starches  Recommended: All kinds of bread (wheat, rye, white, oatmeal, [place], [place], raisin), hard or soft dinner rolls, frankfurter or hamburger buns, small bagels, small corn or whole-wheat flour tortillas.  Avoid: Frosted or glazed breads, butter rolls, egg or cheese breads, croissants, sweet rolls,  pastries, coffee cake, glazed or frosted doughnuts, muffins. Crackers  Recommended: Animal crackers, graham, rye, saltine, oyster, and matzoth crackers. Bread sticks, melba toast, rusks, pretzels, popcorn (without fat), zwieback toast.  Avoid: High-fat snack crackers or chips. Buttered popcorn. Cereals  Recommended: Hot and cold cereals. Whole grains such as oatmeal or shredded wheat are good choices.  Avoid: Sugar-coated or granola type cereals. Potatoes/Pasta/Rice/Beans  Recommended: Order baked, boiled, or mashed potatoes, rice or noodles without added fat, whole beans. Order gravies, butter, margarine, or sauces on the side so you can control the amount you add.  Avoid: Hash browns or fried potatoes. Potatoes, pasta, or rice prepared with cream or cheese sauce. Potato or pasta salads prepared with large amounts of dressing. Fried beans or fried rice. Vegetables  Recommended: Order steamed, baked, boiled, or stewed vegetables without sauces or extra fat. Ask that sauce be served on the side. If vegetables are not listed on the menu, ask what is available.  Avoid: Vegetables prepared with cream, butter, or cheese sauce. Fried vegetables. Salad Bars  Recommended: Many of the vegetables at a salad bar are considered "free." Use lemon juice, vinegar, or low-calorie salad dressing (fewer than 20 calories per serving) as "free" dressings for your salad. Look for salad bar ingredients that have no added fat or sugar such as tomatoes, lettuce, cucumbers, broccoli, carrots, onions, and mushrooms.  Avoid: Prepared salads with large amounts of dressing, such as coleslaw, caesar salad, macaroni salad, bean salad, or carrot salad. Fruit  Recommended: Eat fresh fruit or fresh fruit salad without added dressing. A salad bar often offers fresh fruit choices, but canned fruit at a restaurant is usually packed in sugar or syrup.  Avoid: Sweetened canned or frozen fruits, plain or sweetened fruit  juice. Fruit salads with dressing, sour cream, or sugar added to them. Meat and Meat Substitutes  Recommended: Order broiled, baked, roasted, or grilled meat, poultry, or fish. Trim off all visible fat. Do not eat the skin of poultry. The size stated on the menu is the raw weight. Meat shrinks by  in cooking (for example, 4 oz raw equals 3 oz cooked meat).  Avoid: Deep-fat fried meat, poultry, or fish. Breaded meats. Eggs  Recommended: Order soft, hard-cooked, poached, or scrambled eggs. Omelets may be okay, depending on what ingredients are added. Egg  substitutes are also a good choice.  Avoid: Fried eggs, eggs prepared with cream or cheese sauce. Milk  Recommended: Order low-fat or fat-free milk according to your meal plan. Plain, nonfat yogurt or flavored yogurt with no sugar added may be used as a substitute for milk. Soy milk may also be used.  Avoid: Milk shakes or sweetened milk beverages. Soups and Combination Foods  Recommended: Clear broth or consomm are "free" foods and may be used as an appetizer. Broth-based soups with fat removed count as a starch serving and are preferred over cream soups. Soups made with beans or split peas may be eaten but count as a starch.  Avoid: Fatty soups, soup made with cream, cheese soup. Combination foods prepared with excessive amounts of fat or with cream or cheese sauces. Desserts and Sweets  Recommended: Ask for fresh fruit. Sponge or angel food cake without icing, ice milk, no sugar added ice cream, sherbet, or frozen yogurt may fit into your meal plan occasionally.  Avoid: Pastries, puddings, pies, cakes with icing, custard, gelatin desserts. Fats and Oils  Recommended: Choose healthy fats such as olive oil, canola oil, or tub margarine, reduced fat or fat-free sour cream, cream cheese, avocado, or nuts.  Avoid: Any fats in excess of your allowed portion. Deep-fried foods or any food with a large amount of fat. Note: Ask for all fats  to be served on the side, and limit your portion sizes according to your meal plan. Document Released: 12/13/2005 Document Revised: 03/06/2012 Document Reviewed: 03/12/2014 [facility] Patient Information 2015 [place], [place]. This information is not intended to replace advice given to you by your health care provider. Make sure you discuss any questions you have with your health care provider. How to Avoid Diabetes Problems You can do a lot to prevent or slow down diabetes problems. Following your diabetes plan and taking care of yourself can reduce your risk of serious or life-threatening complications. Below, you will find certain things you can do to prevent diabetes problems. MANAGE YOUR DIABETES Follow your health care provider's, nurse educator's, and dietitian's instructions for managing your diabetes. They will teach you the basics of diabetes care. They can help answer questions you may have. Learn about diabetes and make healthy choices regarding eating and physical activity. Monitor your blood glucose level regularly. Your health care provider will help you decide how often to check your blood glucose level depending on your treatment goals and how well you are meeting them.  DO NOT USE NICOTINE Nicotine and diabetes are a dangerous combination. Nicotine raises your risk for diabetes problems. If you quit using nicotine, you will lower your risk for heart attack, stroke, nerve disease, and kidney disease. Your cholesterol and your blood pressure levels may improve. Your blood circulation will also improve. Do not use any tobacco products, including cigarettes, chewing tobacco, or electronic cigarettes. If you need help quitting, ask your health care provider. KEEP YOUR BLOOD PRESSURE UNDER CONTROL Keeping your blood pressure under control will help prevent damage to your eyes, kidneys, heart, and blood vessels. Blood pressure consists of two numbers. The top number should be below 120, and the  bottom number should be below 80 (120/80). Keep your blood pressure as close to these numbers as you can. If you already have kidney disease, you may want even lower blood pressure to protect your kidneys. Talk to your health care provider to make sure that your blood pressure goal is right for your needs. Meal planning, medicines, and  exercise can help you reach your blood pressure target. Have your blood pressure checked at every visit with your health care provider. KEEP YOUR CHOLESTEROL UNDER CONTROL Normal cholesterol levels will help prevent heart disease and stroke. These are the biggest health problems for people with diabetes. Keeping cholesterol levels under control can also help with blood flow. Have your cholesterol level checked at least once a year. Your health care provider may prescribe a medicine known as a statin. Statins lower your cholesterol. If you are not taking a statin, ask your health care provider if you should be. Meal planning, exercise, and medicines can help you reach your cholesterol targets.  SCHEDULE AND KEEP YOUR ANNUAL PHYSICAL EXAMS AND EYE EXAMS Your health care provider will tell you how often he or she wants to see you depending on your plan of treatment. It is important that you keep these appointments so that possible problems can be identified early and complications can be avoided or treated.  Every visit with your health care provider should include your weight, blood pressure, and an evaluation of your blood glucose control.  Your hemoglobin A1c should be checked:  At least twice a year if you are at your goal.  Every 3 months if there are changes in treatment.  If you are not meeting your goals.  Your blood lipids should be checked yearly. You should also be checked yearly to see if you have protein in your urine (microalbumin).  Schedule a dilated eye exam within 5 years of your diagnosis if you have type 1 diabetes, and then yearly. Schedule a  dilated eye exam at diagnosis if you have type 2 diabetes, and then yearly. All exams thereafter can be extended to every 2 to 3 years if one or more exams have been normal. KEEP YOUR VACCINES CURRENT The flu vaccine is recommended yearly. The formula for the vaccine changes every year and needs to be updated for the best protection against current viruses. It is recommended that people with diabetes who are over 12 years old get the pneumonia vaccine. In some cases, two separate shots may be given. Ask your health care provider if your pneumonia vaccination is up-to-date. However, there are some instances where another vaccine is recommended. Check with your health care provider. TAKE CARE OF YOUR FEET  Diabetes may cause you to have a poor blood supply (circulation) to your legs and feet. Because of this, the skin may be thinner, break easier, and heal more slowly. You also may have nerve damage in your legs and feet, causing decreased feeling. You may not notice minor injuries to your feet that could lead to serious problems or infections. Taking care of your feet is very important. Visual foot exams are performed at every routine medical visit. The exams check for cuts, injuries, or other problems with the feet. A comprehensive foot exam should be done yearly. This includes visual inspection as well as assessing foot pulses and testing for loss of sensation. You should also do the following:  Inspect your feet daily for cuts, calluses, blisters, ingrown toenails, and signs of infection, such as redness, swelling, or pus.  Wash and dry your feet thoroughly, especially between the toes.  Avoid soaking your feet regularly in hot water baths.  Moisturize dry skin with lotion, avoiding areas between your toes.  Cut toenails straight across and file the edges.  Avoid shoes that do not fit well or have areas that irritate your skin.  Avoid going barefooted  or wearing only socks. Your feet need  protection. TAKE CARE OF YOUR TEETH People with poorly controlled diabetes are more likely to have gum (periodontal) disease. These infections make diabetes harder to control. Periodontal diseases, if left untreated, can lead to tooth loss. Brush your teeth twice a day, floss, and see your dentist for checkups and cleaning every 6 months, or 2 times a year. ASK YOUR HEALTH CARE PROVIDER ABOUT TAKING ASPIRIN Taking aspirin daily is recommended to help prevent cardiovascular disease in people with and without diabetes. Ask your health care provider if this would benefit you and what dose he or she would recommend. DRINK RESPONSIBLY Moderate amounts of alcohol (less than 1 drink per day for adult women and less than 2 drinks per day for adult men) have a minimal effect on blood glucose if ingested with food. It is important to eat food with alcohol to avoid hypoglycemia. People should avoid alcohol if they have a history of alcohol abuse or dependence, if they are pregnant, and if they have liver disease, pancreatitis, advanced neuropathy, or severe hypertriglyceridemia. LESSEN STRESS Living with diabetes can be stressful. When you are under stress, your blood glucose may be affected in two ways:  Stress hormones may cause your blood glucose to rise.  You may be distracted from taking good care of yourself. It is a good idea to be aware of your stress level and make changes that are necessary to help you better manage challenging situations. Support groups, planned relaxation, a hobby you enjoy, meditation, healthy relationships, and exercise all work to lower your stress level. If your efforts do not seem to be helping, get help from your health care provider or a trained mental health professional. Document Released: 08/31/2011 Document Revised: 04/29/2014 Document Reviewed: 02/06/2014 [facility] Patient Information 2015 [place], [place]. This information is not intended to replace advice given to you by  your health care provider. Make sure you discuss any questions you have with your health care provider. Diabetic Retinopathy Diabetic retinopathy is a disease of the light-sensitive membrane at the back of the eye (retina). It is a complication of diabetes and a common cause of blindness. Early detection of the disease is key to keeping your eyes healthy.  CAUSES  Diabetic retinopathy is caused by blood sugar (glucose) levels that are too high over an extended period of time. High blood sugars cause damage to the small blood vessels of the retina, allowing blood to leak through the vessel walls. This causes visual impairment and eventually blindness. RISK FACTORS  High blood pressure.  Having diabetes for a long time.  Having poorly controlled blood sugars. SIGNS AND SYMPTOMS  In the early stages of diabetic retinopathy, there are often no symptoms. As the condition advances, symptoms may include:  Blurred vision. This is usually caused by a swelling due to abnormal blood glucose levels. The blurriness may go away when blood glucose levels return to normal.  Moving specks or dark spots (floaters) in your vision. These can be caused by a small retinal hemorrhage. A hemorrhage is bleeding from blood vessels.  Missing parts of your field of vision, such as things at the side. This can be caused by larger retinal hemorrhages.  Difficulty reading books or signs.  Double vision.  Pain in one or both eyes.  Feeling pressure in one or both eyes.  Trouble seeing straight lines. Straight lines do not look straight.  Redness of the eyes that does not go away. DIAGNOSIS  Your eye  care specialist can detect changes in the blood vessels of your eye by putting drops in your eyes that enlarge (dilate) your pupils. This allows your eye care specialist to get a good look at your retina to see if there are any changes that have occurred as a result of your diabetes. You should have your eyes examined  once a year. TREATMENT  Your eye care specialist may use a special laser beam to seal the blood vessels of the retina and stop them from leaking. Early detection and treatment are important so that further damage to your eyes can be prevented. In addition, managing your blood sugars and keeping them in the target range can slow the progress of the disease. HOME CARE INSTRUCTIONS   Keep your blood pressure within your target range.  Keep your blood glucose levels within your target range.  Follow your health care provider's instructions regarding diet and other means for controlling your blood glucose levels.  Check your blood levels for glucose as recommended by your health care provider.  Keep regular appointments with your eye specialist. An eye specialist can usually see diabetic retinopathy developing long before it starts causing problems. In many cases, it can be treated to prevent complications from occurring. If you have diabetes, you should have your eyes checked at least every year. Your risk of retinopathy increases the longer you have the disease.  If you smoke, quit. Ask your health care provider for help if needed. Smoking can make retinopathy worse. SEEK MEDICAL CARE IF:   You notice gradual blurring or other changes in your vision over time.  You notice that your glasses or contact lenses do not make things look as sharp as they once did.  You have trouble reading or seeing details at a distance with either eye.  You notice a sudden change in your vision or notice that parts of your field of vision appear missing or hazy.  You suddenly see moving specks or dark spots in the field of vision of either eye.  You have sudden partial loss of vision in either eye. Document Released: 12/10/2000 Document Revised: 10/03/2013 Document Reviewed: 06/04/2013 [facility] Patient Information 2015 [place], [place]. This information is not intended to replace advice given to you by your  health care provider. Make sure you discuss any questions you have with your health care provider. Diabetic Nephropathy Diabetic nephropathy is a complication of diabetes that leads to damaged kidneys. It develops slowly. The function of healthy kidneys is to filter and clean blood. Kidneys also get rid of body waste products and extra fluid. When the kidney filters are damaged, there is protein loss in the urine, a decline in kidney function, a buildup of kidney waste products and fluid, and high blood pressure. The damage progresses until the kidneys fail.  RISK FACTORS  High blood pressure (hypertension).  High blood sugar (hyperglycemia).  Family history.  Aging.  Obstruction problems affecting the kidneys, the tubes that drain the kidneys (ureters), or the bladder.  Taking certain drugs or medicines. SYMPTOMS  Symptoms may not be seen or felt for many years. You may not notice any signs of kidney failure until your kidneys have lost much of their ability to function. An early sign of damage is when small amounts of protein (albumin) leak into the urine. However, this can only be found through a urine test. Without physical symptoms, a urine test is often not performed. When the kidneys fail, you may feel one or more of  the following:  Swelling of the hands and feet from the extra fluid in your body.  Constant upset stomach.  Constant fatigue. DIAGNOSIS When someone has diabetes, screening tests are done to look for any early signs of problems before symptoms develop and before damage has already been done. These tests may include:   Annual urine tests to screen for trace amounts of protein in the urine (microalbuminuria).  Urine collectionover 24 hours to measure kidney function.  Blood tests that measure kidney function. Your caregiver is aware that problems other than diabetes can damage kidneys. If screening tests show early kidney damage, but it is thought that a different  problem is causing the damage, other tests may be performed. Examples of these tests include:  An ultrasound of your kidney.  Taking a tissue sample (biopsy) from the kidney. TREATMENT The goal of treatment is to prevent or slow down damage to your kidneys. Controlling hypertension and hyperglycemia is critical. Your goal is to maintain a blood pressure below 120/80. If you have certain other medical problems, this goal may be different. Talk to your caregiver to make sure that your blood pressure goal is right for your needs. Regular testing of your blood glucose at home is important. Your goal is to have a normal blood glucose (110 or less when fasting) as often as possible.  In addition, maintaining your hemoglobin A1c level at less than 7% reduces your risk for complications, including kidney damage. Common treatments include:  Dieting by controlling what you eat as well as the portion sizes.  Exercising to control blood pressure and blood glucose.  Taking medicines.  Giving yourself insulin injections if your caregiver feels that it is necessary.  Getting early treatment for urinary tract infections.  Regularly following up with your caregiver. If your disease progresses to end-stage kidney failure, you will need dialysis or a transplant. Dialysis can be done in 1 of 2 ways:  Hemodialysis. Your blood flows from a tube in your arm through a machine. The machine filters waste and extra fluid. The clean blood flows back into your arm.  Peritoneal dialysis. Your abdomen is filled with a special fluid. The fluid collects waste products and extra fluid from your blood. The fluid is then drained from your abdomen and discarded. SEEK MEDICAL CARE IF:   You are having problems keeping your blood glucose in the goal range.  You have swelling of the hands or feet.  You have weakness.  You have muscles spasms.  You have a constant upset stomach.  You feel tired all the time and this  is not normal for you. SEEK IMMEDIATE MEDICAL CARE IF:  You have unusual dizziness or weakness.  You have excessive sleepiness.  You have a seizure or convulsion.  You have severe, painful muscle spasms.  You have shortness of breath or trouble breathing.  You pass out or have a fainting episode.  You have chest pains. MAKE SURE YOU:  Understand these instructions.  Will watch your condition.  Will get help right away if you are not doing well or get worse. Document Released: 01/02/2008 Document Revised: 08/15/2013 Document Reviewed: 08/04/2011 [facility] Patient Information 2015 [place], [place]. This information is not intended to replace advice given to you by your health care provider. Make sure you discuss any questions you have with your health care provider. Diabetes Mellitus and Food It is important for you to manage your blood sugar (glucose) level. Your blood glucose level can be greatly affected by  what you eat. Eating healthier foods in the appropriate amounts throughout the day at about the same time each day will help you control your blood glucose level. It can also help slow or prevent worsening of your diabetes mellitus. Healthy eating may even help you improve the level of your blood pressure and reach or maintain a healthy weight.  HOW CAN FOOD AFFECT ME? Carbohydrates Carbohydrates affect your blood glucose level more than any other type of food. Your dietitian will help you determine how many carbohydrates to eat at each meal and teach you how to count carbohydrates. Counting carbohydrates is important to keep your blood glucose at a healthy level, especially if you are using insulin or taking certain medicines for diabetes mellitus. Alcohol Alcohol can cause sudden decreases in blood glucose (hypoglycemia), especially if you use insulin or take certain medicines for diabetes mellitus. Hypoglycemia can be a life-threatening condition. Symptoms of hypoglycemia  (sleepiness, dizziness, and disorientation) are similar to symptoms of having too much alcohol.  If your health care provider has given you approval to drink alcohol, do so in moderation and use the following guidelines:  Women should not have more than one drink per day, and men should not have more than two drinks per day. One drink is equal to:  12 oz of beer.  5 oz of wine.  1 oz of hard liquor.  Do not drink on an empty stomach.  Keep yourself hydrated. Have water, diet soda, or unsweetened iced tea.  Regular soda, juice, and other mixers might contain a lot of carbohydrates and should be counted. WHAT FOODS ARE NOT RECOMMENDED? As you make food choices, it is important to remember that all foods are not the same. Some foods have fewer nutrients per serving than other foods, even though they might have the same number of calories or carbohydrates. It is difficult to get your body what it needs when you eat foods with fewer nutrients. Examples of foods that you should avoid that are high in calories and carbohydrates but low in nutrients include:  Trans fats (most processed foods list trans fats on the Nutrition Facts label).  Regular soda.  Juice.  Candy.  Sweets, such as cake, pie, doughnuts, and cookies.  Fried foods. WHAT FOODS CAN I EAT? Have nutrient-rich foods, which will nourish your body and keep you healthy. The food you should eat also will depend on several factors, including:  The calories you need.  The medicines you take.  Your weight.  Your blood glucose level.  Your blood pressure level.  Your cholesterol level. You also should eat a variety of foods, including:  Protein, such as meat, poultry, fish, tofu, nuts, and seeds (lean animal proteins are best).  Fruits.  Vegetables.  Dairy products, such as milk, cheese, and yogurt (low fat is best).  Breads, grains, pasta, cereal, rice, and beans.  Fats such as olive oil, trans fat-free  margarine, canola oil, avocado, and olives. DOES EVERYONE WITH DIABETES MELLITUS HAVE THE SAME MEAL PLAN? Because every person with diabetes mellitus is different, there is not one meal plan that works for everyone. It is very important that you meet with a dietitian who will help you create a meal plan that is just right for you. Document Released: 09/09/2005 Document Revised: 12/18/2013 Document Reviewed: 11/09/2013 [facility] Patient Information 2015 [place], [place]. This information is not intended to replace advice given to you by your health care provider. Make sure you discuss any questions you have with  your health care provider. Diabetes and Sick Day Management Blood sugar (glucose) can be more difficult to control when you are sick. Colds, fever, flu, nausea, vomiting, and diarrhea are all examples of common illnesses that can cause problems for people with diabetes. Loss of body fluids (dehydration) from fever, vomiting, diarrhea, infection, and the stress of a sickness can all cause blood glucose levels to increase. Because of this, it is very important to take your diabetes medicines and to eat some form of carbohydrate food when you are sick. Liquid or soft foods are often tolerated, and they help to replace fluids. HOME CARE INSTRUCTIONS These main guidelines are intended for managing a short-term (24 hours or less) sickness:  Take your usual dose of insulin or oral diabetes medicine. An exception would be if you take any form of metformin. If you cannot eat or drink, you can become dehydrated and should not take this medicine.  Continue to take your insulin even if you are unable to eat solid foods or are vomiting. Your insulin dose may stay the same, or it may need to be increased when you are sick.  You will need to test your blood glucose more often, generally every 2-4 hours. If you have type 1 diabetes, test your urine for ketones every 4 hours. If you have type 2 diabetes, test your  urine for ketones as directed by your health care provider.  Eat some form of food that contains carbohydrates. The carbohydrates can be in solid or liquid form. You should eat 45-50 g of carbohydrates every 3-4 hours.  Replace fluids if you have a fever, vomit, or have diarrhea. Ask your health care provider for specific rehydration instructions.  Watch carefully for the signs of ketoacidosis if you have type 1 diabetes. Call your health care provider if any of the following symptoms are present, especially in children:  Moderate to large ketones in the urine along with a high blood glucose level.  Severe nausea.  Vomiting.  Diarrhea.  Abdominal pain.  Rapid breathing.  Drink extra liquids that do not contain sugar such as water.  Be careful with over-the-counter medicines. Read the labels. They may contain sugar or types of sugars that can increase your blood glucose level. Food Choices for Illness All of the food choices below contain about 15 g of carbohydrates. Plan ahead and keep some of these foods around.    to  cup carbonated beverage containing sugar. Carbonated beverages will usually be better tolerated if they are opened and left at room temperature for a few minutes.   of a twin frozen ice pop.   cup regular gelatin.   cup juice.   cup ice cream or frozen yogurt.   cup cooked cereal.   cup sherbet.  1 cup clear broth or soup.  1 cup cream soup.   cup regular custard.   cup regular pudding.  1 cup sports drink.  1 cup plain yogurt.  1 slice toast.  6 squares saltine crackers.  5 vanilla wafers. SEEK MEDICAL CARE IF:   You are unable to drink fluids, even small amounts.  You have nausea and vomiting for more than 6 hours.  You have diarrhea for more than 6 hours.  Your blood glucose level is more than 240 mg/dL, even with additional insulin.  There is a change in mental status.  You develop an additional serious  sickness.  You have been sick for 2 days and are not getting better.  You  have a fever. SEEK IMMEDIATE MEDICAL CARE IF:  You have difficulty breathing.  You have moderate to large ketone levels. MAKE SURE YOU:  Understand these instructions.  Will watch your condition.  Will get help right away if you are not doing well or get worse. Document Released: 12/16/2003 Document Revised: 04/29/2014 Document Reviewed: 05/22/2013 [facility] Patient Information 2015 [place], [place]. This information is not intended to replace advice given to you by your health care provider. Make sure you discuss any questions you have with your health care provider. Type 2 Diabetes Mellitus Type 2 diabetes mellitus, often simply referred to as type 2 diabetes, is a long-lasting (chronic) disease. In type 2 diabetes, the pancreas does not make enough insulin (a hormone), the cells are less responsive to the insulin that is made (insulin resistance), or both. Normally, insulin moves sugars from food into the tissue cells. The tissue cells use the sugars for energy. The lack of insulin or the lack of normal response to insulin causes excess sugars to build up in the blood instead of going into the tissue cells. As a result, high blood sugar (hyperglycemia) develops. The effect of high sugar (glucose) levels can cause many complications. Type 2 diabetes was also previously called adult-onset diabetes, but it can occur at any age.  RISK FACTORS  A person is predisposed to developing type 2 diabetes if someone in the family has the disease and also has one or more of the following primary risk factors:  Overweight.  An inactive lifestyle.  A history of consistently eating high-calorie foods. Maintaining a normal weight and regular physical activity can reduce the chance of developing type 2 diabetes. SYMPTOMS  A person with type 2 diabetes may not show symptoms initially. The symptoms of type 2 diabetes appear  slowly. The symptoms include:  Increased thirst (polydipsia).  Increased urination (polyuria).  Increased urination during the night (nocturia).  Weight loss. This weight loss may be rapid.  Frequent, recurring infections.  Tiredness (fatigue).  Weakness.  Vision changes, such as blurred vision.  Fruity smell to your breath.  Abdominal pain.  Nausea or vomiting.  Cuts or bruises which are slow to heal.  Tingling or numbness in the hands or feet. DIAGNOSIS Type 2 diabetes is frequently not diagnosed until complications of diabetes are present. Type 2 diabetes is diagnosed when symptoms or complications are present and when blood glucose levels are increased. Your blood glucose level may be checked by one or more of the following blood tests:  A fasting blood glucose test. You will not be allowed to eat for at least 8 hours before a blood sample is taken.  A random blood glucose test. Your blood glucose is checked at any time of the day regardless of when you ate.  A hemoglobin A1c blood glucose test. A hemoglobin A1c test provides information about blood glucose control over the previous 3 months.  An oral glucose tolerance test (OGTT). Your blood glucose is measured after you have not eaten (fasted) for 2 hours and then after you drink a glucose-containing beverage. TREATMENT   You may need to take insulin or diabetes medicine daily to keep blood glucose levels in the desired range.  If you use insulin, you may need to adjust the dosage depending on the carbohydrates that you eat with each meal or snack. The treatment goal is to maintain the before meal blood sugar (preprandial glucose) level at 70-130 mg/dL. HOME CARE INSTRUCTIONS   Have your hemoglobin A1c level  checked twice a year.  Perform daily blood glucose monitoring as directed by your health care provider.  Monitor urine ketones when you are ill and as directed by your health care provider.  Take your  diabetes medicine or insulin as directed by your health care provider to maintain your blood glucose levels in the desired range.  Never run out of diabetes medicine or insulin. It is needed every day.  If you are using insulin, you may need to adjust the amount of insulin given based on your intake of carbohydrates. Carbohydrates can raise blood glucose levels but need to be included in your diet. Carbohydrates provide vitamins, minerals, and fiber which are an essential part of a healthy diet. Carbohydrates are found in fruits, vegetables, whole grains, dairy products, legumes, and foods containing added sugars.  Eat healthy foods. You should make an appointment to see a registered dietitian to help you create an eating plan that is right for you.  Lose weight if you are overweight.  Carry a medical alert card or wear your medical alert jewelry.  Carry a 15-gram carbohydrate snack with you at all times to treat low blood glucose (hypoglycemia). Some examples of 15-gram carbohydrate snacks include:  Glucose tablets, 3 or 4.  Glucose gel, 15-gram tube.  Raisins, 2 tablespoons (24 grams).  Jelly beans, 6.  Animal crackers, 8.  Regular pop, 4 ounces (120 mL).  Gummy treats, 9.  Recognize hypoglycemia. Hypoglycemia occurs with blood glucose levels of 70 mg/dL and below. The risk for hypoglycemia increases when fasting or skipping meals, during or after intense exercise, and during sleep. Hypoglycemia symptoms can include:  Tremors or shakes.  Decreased ability to concentrate.  Sweating.  Increased heart rate.  Headache.  Dry mouth.  Hunger.  Irritability.  Anxiety.  Restless sleep.  Altered speech or coordination.  Confusion.  Treat hypoglycemia promptly. If you are alert and able to safely swallow, follow the 15:15 rule:  Take 15-20 grams of rapid-acting glucose or carbohydrate. Rapid-acting options include glucose gel, glucose tablets, or 4 ounces (120 mL) of  fruit juice, regular soda, or low-fat milk.  Check your blood glucose level 15 minutes after taking the glucose.  Take 15-20 grams more of glucose if the repeat blood glucose level is still 70 mg/dL or below.  Eat a meal or snack within 1 hour once blood glucose levels return to normal.  Be alert to feeling very thirsty and urinating more frequently than usual, which are early signs of hyperglycemia. An early awareness of hyperglycemia allows for prompt treatment. Treat hyperglycemia as directed by your health care provider.  Engage in at least 150 minutes of moderate-intensity physical activity a week, spread over at least 3 days of the week or as directed by your health care provider. In addition, you should engage in resistance exercise at least 2 times a week or as directed by your health care provider. Try to spend no more than 90 minutes at one time inactive.  Adjust your medicine and food intake as needed if you start a new exercise or sport.  Follow your sick-day plan anytime you are unable to eat or drink as usual.  Do not use any tobacco products including cigarettes, chewing tobacco, or electronic cigarettes. If you need help quitting, ask your health care provider.  Limit alcohol intake to no more than 1 drink per day for nonpregnant women and 2 drinks per day for men. You should drink alcohol only when you are also eating food.  Talk with your health care provider whether alcohol is safe for you. Tell your health care provider if you drink alcohol several times a week.  Keep all follow-up visits as directed by your health care provider. This is important.  Schedule an eye exam soon after the diagnosis of type 2 diabetes and then annually.  Perform daily skin and foot care. Examine your skin and feet daily for cuts, bruises, redness, nail problems, bleeding, blisters, or sores. A foot exam by a health care provider should be done annually.  Brush your teeth and gums at least  twice a day and floss at least once a day. Follow up with your dentist regularly.  Share your diabetes management plan with your workplace or school.  Stay up-to-date with immunizations. It is recommended that people with diabetes who are over 40 years old get the pneumonia vaccine. In some cases, two separate shots may be given. Ask your health care provider if your pneumonia vaccination is up-to-date.  Learn to manage stress.  Obtain ongoing diabetes education and support as needed.  Participate in or seek rehabilitation as needed to maintain or improve independence and quality of life. Request a physical or occupational therapy referral if you are having foot or hand numbness, or difficulties with grooming, dressing, eating, or physical activity. SEEK MEDICAL CARE IF:   You are unable to eat food or drink fluids for more than 6 hours.  You have nausea and vomiting for more than 6 hours.  Your blood glucose level is over 240 mg/dL.  There is a change in mental status.  You develop an additional serious illness.  You have diarrhea for more than 6 hours.  You have been sick or have had a fever for a couple of days and are not getting better.  You have pain during any physical activity.  SEEK IMMEDIATE MEDICAL CARE IF:  You have difficulty breathing.  You have moderate to large ketone levels. MAKE SURE YOU:  Understand these instructions.  Will watch your condition.  Will get help right away if you are not doing well or get worse. Document Released: 12/13/2005 Document Revised: 04/29/2014 Document Reviewed: 07/11/2012 [facility] Patient Information 2015 [place], [place]. This information is not intended to replace advice given to you by your health care provider. Make sure you discuss any questions you have with your health care provider.

## 2015-01-27 NOTE — Discharge Instructions (Signed)

## 2015-01-27 NOTE — ED Notes (Signed)
Patient was given two cups of ice water.

## 2015-01-28 ENCOUNTER — Telehealth: Payer: Self-pay

## 2015-01-28 LAB — COMPREHENSIVE METABOLIC PANEL
ALK PHOS: 157 U/L — AB (ref 39–117)
ALT: 25 U/L (ref 0–53)
AST: 15 U/L (ref 0–37)
Albumin: 4.4 g/dL (ref 3.5–5.2)
BILIRUBIN TOTAL: 0.8 mg/dL (ref 0.2–1.2)
BUN: 16 mg/dL (ref 6–23)
CO2: 20 meq/L (ref 19–32)
CREATININE: 1.07 mg/dL (ref 0.50–1.35)
Calcium: 10.2 mg/dL (ref 8.4–10.5)
Chloride: 85 mEq/L — ABNORMAL LOW (ref 96–112)
Glucose, Bld: 633 mg/dL (ref 70–99)
POTASSIUM: 4.5 meq/L (ref 3.5–5.3)
Sodium: 126 mEq/L — ABNORMAL LOW (ref 135–145)
Total Protein: 8.1 g/dL (ref 6.0–8.3)

## 2015-01-28 LAB — LIPID PANEL
Cholesterol: 335 mg/dL — ABNORMAL HIGH (ref 0–200)
HDL: 18 mg/dL — ABNORMAL LOW (ref >39)
TRIGLYCERIDES: 1894 mg/dL — AB (ref <150)
Total CHOL/HDL Ratio: 18.6 Ratio

## 2015-01-28 LAB — MICROALBUMIN, URINE: MICROALB UR: 0.5 mg/dL (ref <2.0)

## 2015-01-28 NOTE — Telephone Encounter (Signed)
Spoke with [facility], pt's glucose was 633 and the test was repeated and verified.

## 2015-01-29 ENCOUNTER — Telehealth: Payer: Self-pay

## 2015-01-29 LAB — CBG MONITORING, ED
GLUCOSE-CAPILLARY: 276 mg/dL — AB (ref 70–99)
Glucose-Capillary: 299 mg/dL — ABNORMAL HIGH (ref 70–99)

## 2015-01-29 NOTE — Telephone Encounter (Signed)
Pt's wife called to inquire about PA for glucose testing strips. States [facility] on [address] advised pt that they will not fill until PA is complete. Please advise. CB # [phone]

## 2015-01-30 NOTE — Telephone Encounter (Signed)
Spoke with pharmacist. I advised her to resend prior authorization. Awaiting fax.

## 2015-02-03 NOTE — Progress Notes (Signed)
In what time frame would you like this patient to be seen?

## 2015-02-03 NOTE — Telephone Encounter (Signed)
This request has received a Unfavorable outcome. Please see letter faxed to your office for details on this adverse benefit determination. Please note any additional information provided by [employer] at the bottom of this request.  [employer] may be covered. Called wife to let her know I was still working on this.

## 2015-02-05 MED ORDER — BLOOD GLUCOSE METER KIT: PACK | Status: DC

## 2015-02-05 NOTE — Telephone Encounter (Signed)
I sent in a Rx for another meter, strips, and lancets.

## 2015-04-23 ENCOUNTER — Ambulatory Visit (INDEPENDENT_AMBULATORY_CARE_PROVIDER_SITE_OTHER): Payer: BLUE CROSS/BLUE SHIELD | Admitting: Family

## 2015-04-23 ENCOUNTER — Encounter: Payer: Self-pay | Admitting: Family

## 2015-04-23 ENCOUNTER — Other Ambulatory Visit (INDEPENDENT_AMBULATORY_CARE_PROVIDER_SITE_OTHER): Payer: BLUE CROSS/BLUE SHIELD

## 2015-04-23 VITALS — BP 140/80 | HR 107 | Temp 98.2°F | Resp 18 | Ht 71.0 in | Wt 270.4 lb

## 2015-04-23 DIAGNOSIS — E1165 Type 2 diabetes mellitus with hyperglycemia: Secondary | ICD-10-CM

## 2015-04-23 DIAGNOSIS — E119 Type 2 diabetes mellitus without complications: Secondary | ICD-10-CM | POA: Insufficient documentation

## 2015-04-23 DIAGNOSIS — Z23 Encounter for immunization: Secondary | ICD-10-CM

## 2015-04-23 DIAGNOSIS — Z0000 Encounter for general adult medical examination without abnormal findings: Secondary | ICD-10-CM | POA: Diagnosis not present

## 2015-04-23 DIAGNOSIS — IMO0002 Reserved for concepts with insufficient information to code with codable children: Secondary | ICD-10-CM

## 2015-04-23 DIAGNOSIS — E1169 Type 2 diabetes mellitus with other specified complication: Secondary | ICD-10-CM | POA: Insufficient documentation

## 2015-04-23 LAB — HEMOGLOBIN A1C: Hgb A1c MFr Bld: 8 % — ABNORMAL HIGH (ref 4.6–6.5)

## 2015-04-23 NOTE — Assessment & Plan Note (Signed)
1) Anticipatory Guidance: Discussed importance of wearing a seatbelt while driving and not texting while driving; changing batteries in smoke detector at least once annually; wearing suntan lotion when outside; eating a balanced and moderate diet; getting physical activity at least 30 minutes per day.  2) Immunizations / Screenings / Labs: Unable to recall last tetanus shot. Tetanus shot updated today. All other immunizations are up-to-date per recommendations. Patient is due for a dental and vision screening. He is also due for a newly diagnosed diabetic eye exam. Discuss with patient at next visit. All other screenings are up-to-date per recommendations. Obtain CBC, BMET, Lipid profile and TSH when fasting.    Overall well exam. Patient has several risk factors for cardiovascular disease including diabetes, hypertension, and obesity. Cannot rule out hyperlipidemia. Discussed treatment of diabetes with patient and potential need for insulin therapy. He is reluctant but willing to consider it. His previous A1c was 12.3. Obtain A1c to determine current status. He does indicate that his blood sugars at home are running in the 70s to 80s in the mornings. Hypertension appears well controlled with current regimen of quinapril and amlodipine. Continue current dosages of both medications. Patient is morbidly obese with a BMI of 37.7. Discussed starting weight loss goal of 5-10% of his body weight through increased nutrient density and decreasing saturated fats while increasing his physical activity daily to 30 minutes most days of the week of moderate intensity exercise. Patient is in agreement with these goals. Follow-up prevention exam in 1 year. Follow-up office visit pending A1c and lab work.

## 2015-04-23 NOTE — Assessment & Plan Note (Signed)
Newly diagnosed and uncontrolled in February 2016 with an A1c of 12.3. Patient is currently taking metformin XL and Amaryl. Continue current dosages of both medications. Continue to monitor blood sugars daily in the morning. Obtain A1c. Follow up pending A1c results and fasting blood sugar when remaining labs are completed.

## 2015-04-23 NOTE — Progress Notes (Signed)
Pre visit review using our clinic review tool, if applicable. No additional management support is needed unless otherwise documented below in the visit note. 

## 2015-04-23 NOTE — Patient Instructions (Signed)
Thank you for choosing [facility].  Summary/Instructions:  Please stop by the lab on the basement level of the building for your blood work. Your results will be released to [facility] (or called to you) after review, usually within 72 hours after test completion. If any changes need to be made, you will be notified at that same time.  If your symptoms worsen or fail to improve, please contact our office for further instruction, or in case of emergency go directly to the emergency room at the closest medical facility.   Health Maintenance A healthy lifestyle and preventative care can promote health and wellness.  Maintain regular health, dental, and eye exams.  Eat a healthy diet. Foods like vegetables, fruits, whole grains, low-fat dairy products, and lean protein foods contain the nutrients you need and are low in calories. Decrease your intake of foods high in solid fats, added sugars, and salt. Get information about a proper diet from your health care provider, if necessary.  Regular physical exercise is one of the most important things you can do for your health. Most adults should get at least 150 minutes of moderate-intensity exercise (any activity that increases your heart rate and causes you to sweat) each week. In addition, most adults need muscle-strengthening exercises on 2 or more days a week.   Maintain a healthy weight. The body mass index (BMI) is a screening tool to identify possible weight problems. It provides an estimate of body fat based on height and weight. Your health care provider can find your BMI and can help you achieve or maintain a healthy weight. For males 20 years and older:  A BMI below 18.5 is considered underweight.  A BMI of 18.5 to 24.9 is normal.  A BMI of 25 to 29.9 is considered overweight.  A BMI of 30 and above is considered obese.  Maintain normal blood lipids and cholesterol by exercising and minimizing your intake of saturated fat. Eat a  balanced diet with plenty of fruits and vegetables. Blood tests for lipids and cholesterol should begin at age 95 and be repeated every 5 years. If your lipid or cholesterol levels are high, you are over age 84, or you are at high risk for heart disease, you may need your cholesterol levels checked more frequently.Ongoing high lipid and cholesterol levels should be treated with medicines if diet and exercise are not working.  If you smoke, find out from your health care provider how to quit. If you do not use tobacco, do not start.  Lung cancer screening is recommended for adults aged 3-80 years who are at high risk for developing lung cancer because of a history of smoking. A yearly low-dose CT scan of the lungs is recommended for people who have at least a 30-pack-year history of smoking and are current smokers or have quit within the past 15 years. A pack year of smoking is smoking an average of 1 pack of cigarettes a day for 1 year (for example, a 30-pack-year history of smoking could mean smoking 1 pack a day for 30 years or 2 packs a day for 15 years). Yearly screening should continue until the smoker has stopped smoking for at least 15 years. Yearly screening should be stopped for people who develop a health problem that would prevent them from having lung cancer treatment.  If you choose to drink alcohol, do not have more than 2 drinks per day. One drink is considered to be 12 oz (360 mL) of beer,  5 oz (150 mL) of wine, or 1.5 oz (45 mL) of liquor.  Avoid the use of street drugs. Do not share needles with anyone. Ask for help if you need support or instructions about stopping the use of drugs.  High blood pressure causes heart disease and increases the risk of stroke. Blood pressure should be checked at least every 1-2 years. Ongoing high blood pressure should be treated with medicines if weight loss and exercise are not effective.  If you are 36-59 years old, ask your health care provider if  you should take aspirin to prevent heart disease.  Diabetes screening involves taking a blood sample to check your fasting blood sugar level. This should be done once every 3 years after age 45 if you are at a normal weight and without risk factors for diabetes. Testing should be considered at a younger age or be carried out more frequently if you are overweight and have at least 1 risk factor for diabetes.  Colorectal cancer can be detected and often prevented. Most routine colorectal cancer screening begins at the age of 13 and continues through age 9. However, your health care provider may recommend screening at an earlier age if you have risk factors for colon cancer. On a yearly basis, your health care provider may provide home test kits to check for hidden blood in the stool. A small camera at the end of a tube may be used to directly examine the colon (sigmoidoscopy or colonoscopy) to detect the earliest forms of colorectal cancer. Talk to your health care provider about this at age 62 when routine screening begins. A direct exam of the colon should be repeated every 5-10 years through age 87, unless early forms of precancerous polyps or small growths are found.  People who are at an increased risk for hepatitis B should be screened for this virus. You are considered at high risk for hepatitis B if:  You were born in a country where hepatitis B occurs often. Talk with your health care provider about which countries are considered high risk.  Your parents were born in a high-risk country and you have not received a shot to protect against hepatitis B (hepatitis B vaccine).  You have HIV or AIDS.  You use needles to inject street drugs.  You live with, or have sex with, someone who has hepatitis B.  You are a man who has sex with other men (MSM).  You get hemodialysis treatment.  You take certain medicines for conditions like cancer, organ transplantation, and autoimmune  conditions.  Hepatitis C blood testing is recommended for all people born from [birth year] through 1966 and any individual with known risk factors for hepatitis C.  Healthy men should no longer receive prostate-specific antigen (PSA) blood tests as part of routine cancer screening. Talk to your health care provider about prostate cancer screening.  Testicular cancer screening is not recommended for adolescents or adult males who have no symptoms. Screening includes self-exam, a health care provider exam, and other screening tests. Consult with your health care provider about any symptoms you have or any concerns you have about testicular cancer.  Practice safe sex. Use condoms and avoid high-risk sexual practices to reduce the spread of sexually transmitted infections (STIs).  You should be screened for STIs, including gonorrhea and chlamydia if:  You are sexually active and are younger than 24 years.  You are older than 24 years, and your health care provider tells you that you are at  risk for this type of infection.  Your sexual activity has changed since you were last screened, and you are at an increased risk for chlamydia or gonorrhea. Ask your health care provider if you are at risk.  If you are at risk of being infected with HIV, it is recommended that you take a prescription medicine daily to prevent HIV infection. This is called pre-exposure prophylaxis (PrEP). You are considered at risk if:  You are a man who has sex with other men (MSM).  You are a heterosexual man who is sexually active with multiple partners.  You take drugs by injection.  You are sexually active with a partner who has HIV.  Talk with your health care provider about whether you are at high risk of being infected with HIV. If you choose to begin PrEP, you should first be tested for HIV. You should then be tested every 3 months for as long as you are taking PrEP.  Use sunscreen. Apply sunscreen liberally and  repeatedly throughout the day. You should seek shade when your shadow is shorter than you. Protect yourself by wearing long sleeves, pants, a wide-brimmed hat, and sunglasses year round whenever you are outdoors.  Tell your health care provider of new moles or changes in moles, especially if there is a change in shape or color. Also, tell your health care provider if a mole is larger than the size of a pencil eraser.  A one-time screening for abdominal aortic aneurysm (AAA) and surgical repair of large AAAs by ultrasound is recommended for men aged 72-75 years who are current or former smokers.  Stay current with your vaccines (immunizations). Document Released: 06/10/2008 Document Revised: 12/18/2013 Document Reviewed: 05/10/2011 [facility] Patient Information 2015 [place], [place]. This information is not intended to replace advice given to you by your health care provider. Make sure you discuss any questions you have with your health care provider.

## 2015-04-23 NOTE — Progress Notes (Signed)
Subjective:    Patient ID: Chris Watkins, male    DOB: [birth year], 44 y.o.   MRN: [id number]  Chief Complaint  Patient presents with  . Establish Care    CPE    HPI:  Chris Watkins is a 44 y.o. male who presents today for an annual wellness visit.   1) Health Maintenance -   Diet - Averages about 3 meals per day; whole grains, protein, fast food, some fried foods, some vegetables; Caffeine intake about 1-2 cups per day.   Exercise - Walks a lot on his job, no structured exercise    2) Preventative Exams / Immunizations:  Dental -- Due for exam  Vision -- Due for exam    Health Maintenance  Topic Date Due  . PNEUMOCOCCAL POLYSACCHARIDE VACCINE (1) 08/27/1972  . FOOT EXAM  08/27/1980  . OPHTHALMOLOGY EXAM  08/27/1980  . HIV Screening  08/27/1985  . TETANUS/TDAP  08/27/1989  . INFLUENZA VACCINE  07/28/2015  . HEMOGLOBIN A1C  07/28/2015  . URINE MICROALBUMIN  01/28/2016  Tetanus shot;    There is no immunization history on file for this patient.  No Known Allergies  Current Outpatient Prescriptions on File Prior to Visit  Medication Sig Dispense Refill  . amLODipine (NORVASC) 10 MG tablet Take 1 tablet (10 mg total) by mouth daily. 90 tablet 3  . blood glucose meter kit and supplies Dispense based on patient and insurance preference. Check fasting blood sugar daily. Diagnosis: Diabetes E11.9 1 each 0  . glimepiride (AMARYL) 4 MG tablet Take 2 tablets (8 mg total) by mouth daily before breakfast. 60 tablet 5  . glucose monitoring kit (FREESTYLE) monitoring kit Check fasting BS daily   Dispense 100 lancets and test strips 1 each 0  . hydrochlorothiazide (HYDRODIURIL) 25 MG tablet Take 25 mg by mouth daily.    . metFORMIN (GLUCOPHAGE-XR) 500 MG 24 hr tablet Take 500 mg by mouth daily with breakfast.    . quinapril (ACCUPRIL) 40 MG tablet Take 40 mg by mouth at bedtime.     No current facility-administered medications on file prior to visit.    Past Medical  History  Diagnosis Date  . Diabetes mellitus without complication   . Hypertension   . Chicken pox     Past Surgical History  Procedure Laterality Date  . Hernia repair    . Slipped capital femoral epiphysis pinning      Family History  Problem Relation Age of Onset  . Thyroid disease Mother   . Hypertension Father   . Diabetes Father   . Diabetes Maternal Grandmother     History   Social History  . Marital Status: Married    Spouse Name: N/A  . Number of Children: 3  . Years of Education: 14   Occupational History  . Lamination operator    Social History Main Topics  . Smoking status: Current Every Day Smoker -- 0.50 packs/day    Types: Cigarettes  . Smokeless tobacco: Never Used  . Alcohol Use: Yes     Comment: Has not since he was diagnosed with DM in 01/2015  . Drug Use: No  . Sexual Activity: Not on file   Other Topics Concern  . Not on file   Social History Narrative   Lives with his wife and children. Fun: Play golf, sing, listen to music, watch sports.     Denies religious beliefs effecting health care.      Review of Systems  Constitutional: Denies fever, chills, fatigue, or significant weight gain/loss. HENT: Head: Denies headache or neck pain Ears: Denies changes in hearing, ringing in ears, earache, drainage Nose: Denies discharge, stuffiness, itching, nosebleed, sinus pain Throat: Denies sore throat, hoarseness, dry mouth, sores, thrush Eyes: Denies loss/changes in vision, pain, redness, blurry/double vision, flashing lights Cardiovascular: Denies chest pain/discomfort, tightness, palpitations, shortness of breath with activity, difficulty lying down, swelling, sudden awakening with shortness of breath Respiratory: Denies shortness of breath, cough, sputum production, wheezing Gastrointestinal: Denies dysphasia, heartburn, change in appetite, nausea, change in bowel habits, rectal bleeding, constipation, diarrhea, yellow skin or  eyes Genitourinary: Denies frequency, urgency, burning/pain, blood in urine, incontinence, change in urinary strength. Musculoskeletal: Denies muscle/joint pain, stiffness, back pain, redness or swelling of joints, trauma Skin: Denies rashes, lumps, itching, dryness, color changes, or hair/nail changes Neurological: Denies dizziness, fainting, seizures, weakness, numbness, tingling, tremor Psychiatric - Denies nervousness, stress, depression or memory loss Endocrine: Denies heat or cold intolerance, sweating, frequent urination, excessive thirst, changes in appetite Hematologic: Denies ease of bruising or bleeding     Objective:    BP 140/80 mmHg  Pulse 107  Temp(Src) 98.2 F (36.8 C) (Oral)  Resp 18  Ht _0  (1.803 m)  Wt 270 lb 6.4 oz (122.653 kg)  BMI 37.73 kg/m2  SpO2 97% Nursing note and vital signs reviewed.  Physical Exam  Constitutional: He is oriented to person, place, and time. He appears well-developed and well-nourished.  HENT:  Head: Normocephalic.  Right Ear: Hearing, tympanic membrane, external ear and ear canal normal.  Left Ear: Hearing, tympanic membrane, external ear and ear canal normal.  Nose: Nose normal.  Mouth/Throat: Uvula is midline, oropharynx is clear and moist and mucous membranes are normal.  Eyes: Conjunctivae and EOM are normal. Pupils are equal, round, and reactive to light.  Neck: Neck supple. No JVD present. No tracheal deviation present. No thyromegaly present.  Cardiovascular: Normal rate, regular rhythm, normal heart sounds and intact distal pulses.   Pulmonary/Chest: Effort normal and breath sounds normal.  Abdominal: Soft. Bowel sounds are normal. He exhibits no distension and no mass. There is no tenderness. There is no rebound and no guarding.  Musculoskeletal: Normal range of motion. He exhibits no edema or tenderness.  Lymphadenopathy:    He has no cervical adenopathy.  Neurological: He is alert and oriented to person, place, and  time. He has normal reflexes. No cranial nerve deficit. He exhibits normal muscle tone. Coordination normal.  Skin: Skin is warm and dry.  Psychiatric: He has a normal mood and affect. His behavior is normal. Judgment and thought content normal.       Assessment & Plan:

## 2015-04-24 ENCOUNTER — Telehealth: Payer: Self-pay | Admitting: Family

## 2015-04-24 LAB — LIPID PANEL
Cholesterol: 124 mg/dL (ref 0–200)
HDL: 29 mg/dL — AB (ref 35–70)
LDL Cholesterol: 82 mg/dL
Triglycerides: 67 mg/dL (ref 40–160)

## 2015-04-24 LAB — HEPATIC FUNCTION PANEL
ALT: 25 U/L (ref 10–40)
AST: 22 U/L (ref 14–40)

## 2015-04-24 LAB — CBC AND DIFFERENTIAL
HEMATOCRIT: 40 % — AB (ref 41–53)
Hemoglobin: 13.3 g/dL — AB (ref 13.5–17.5)
Neutrophils Absolute: 62 /uL
PLATELETS: 249 10*3/uL (ref 150–399)
WBC: 5.8 10*3/mL

## 2015-04-24 NOTE — Telephone Encounter (Signed)
Please inform patient that his A1c dropped from 12.3 to 8.0 which is excellent. Therefore please continue with what we discussed yesterday and will plan follow up after his other lab work.

## 2015-04-24 NOTE — Telephone Encounter (Signed)
Patient called back and I read Greg's notes to him. He did go get fasting lab work done this morning and they should be faxing you the results. Please call him if you have anything else.

## 2015-04-24 NOTE — Telephone Encounter (Signed)
LVM for pt to call back.

## 2015-06-13 ENCOUNTER — Telehealth: Payer: Self-pay | Admitting: Family

## 2015-06-13 ENCOUNTER — Other Ambulatory Visit: Payer: Self-pay | Admitting: Emergency Medicine

## 2015-06-13 NOTE — Telephone Encounter (Signed)
Returned pt's call. LVM.

## 2015-06-13 NOTE — Telephone Encounter (Signed)
Please call patient's wife Alycia at [phone].  - Patient was in on 4/27 as a new patient and there was no instructions on when he should come back to see him. She is concerned since he was recently diagnosed with diabetes. - There was a prescription refill request from pharmacy and it was sent to the doctor that helped him in urgent care back in February. She thinks the refill request should have been sent here.

## 2015-06-24 ENCOUNTER — Telehealth: Payer: Self-pay | Admitting: *Deleted

## 2015-06-24 NOTE — Telephone Encounter (Signed)
Left message for patient instructing him to get his a1c rechecked after July 27/2016.

## 2015-07-26 ENCOUNTER — Other Ambulatory Visit: Payer: Self-pay | Admitting: Emergency Medicine

## 2015-07-29 ENCOUNTER — Encounter: Payer: Self-pay | Admitting: Family

## 2015-08-04 ENCOUNTER — Encounter: Payer: Self-pay | Admitting: Family

## 2015-08-04 LAB — COMPREHENSIVE METABOLIC PANEL
A1c: 8
ALK PHOS: 84 U/L
ALT: 25
AST: 22 U/L
Albumin/Globulin Ratio: 1.4
Albumin: 4.3
BILIRUBIN TOTAL: 0.4 mg/dL
BUN / CREAT RATIO: 21
BUN: 14
CREATINE, SERUM: 0.66
Calcium: 9 mg/dL
Carbon Dioxide, Total: 24
Chloride: 98 mmol/L
Cholesterol, Total: 124
GFR CALC AF AMER: 136
GFR CALC NON AF AMER: 118
GLOBULIN, TOTAL: 3.1
Glucose: 94
HDL Cholesterol: 29
LDL: 82
PROTEIN: 7.4
Potassium: 4.2 mmol/L
Prostate Specific Ag, Serum: 0.6
Sodium: 139
Triglycerides: 67
VLDL: 13 mg/dL
Vit D, 25-Hydroxy: 14.6

## 2015-08-04 LAB — CBC WITH DIFFERENTIAL/PLATELET
HCT: 40.2
HEMOGLOBIN: 13.3 g/dL
MCH: 29
MCV: 88
Platelet: 249
RBC: 4.58
RDW: 14.8
WBC: 5.8

## 2015-08-26 ENCOUNTER — Other Ambulatory Visit: Payer: Self-pay | Admitting: Emergency Medicine

## 2015-09-01 ENCOUNTER — Other Ambulatory Visit: Payer: Self-pay | Admitting: Emergency Medicine

## 2015-09-02 ENCOUNTER — Telehealth: Payer: Self-pay | Admitting: *Deleted

## 2015-09-02 MED ORDER — HYDROCHLOROTHIAZIDE 25 MG PO TABS: 25.0000 mg | ORAL_TABLET | Freq: Every day | ORAL | Status: DC

## 2015-09-02 MED ORDER — AMLODIPINE BESYLATE 10 MG PO TABS: 10.0000 mg | ORAL_TABLET | Freq: Every day | ORAL | Status: DC

## 2015-09-02 MED ORDER — QUINAPRIL HCL 40 MG PO TABS: 40.0000 mg | ORAL_TABLET | Freq: Every day | ORAL | Status: DC

## 2015-09-02 MED ORDER — METFORMIN HCL ER 500 MG PO TB24: 500.0000 mg | ORAL_TABLET | Freq: Every day | ORAL | Status: DC

## 2015-09-02 MED ORDER — GLIMEPIRIDE 4 MG PO TABS: 8.0000 mg | ORAL_TABLET | Freq: Every day | ORAL | Status: DC

## 2015-09-02 NOTE — Telephone Encounter (Signed)
Left msg on triage stating he is needing refills on all of his medications. Called pt back no answer LMOM refills sent to [facility] [address].....Johny Chess

## 2015-10-14 ENCOUNTER — Other Ambulatory Visit: Payer: Self-pay | Admitting: Emergency Medicine

## 2015-10-16 ENCOUNTER — Ambulatory Visit: Payer: BLUE CROSS/BLUE SHIELD | Admitting: Family

## 2015-10-20 ENCOUNTER — Ambulatory Visit (INDEPENDENT_AMBULATORY_CARE_PROVIDER_SITE_OTHER): Payer: BLUE CROSS/BLUE SHIELD | Admitting: Family

## 2015-10-20 ENCOUNTER — Encounter: Payer: Self-pay | Admitting: Family

## 2015-10-20 ENCOUNTER — Other Ambulatory Visit: Payer: BLUE CROSS/BLUE SHIELD

## 2015-10-20 ENCOUNTER — Other Ambulatory Visit (INDEPENDENT_AMBULATORY_CARE_PROVIDER_SITE_OTHER): Payer: BLUE CROSS/BLUE SHIELD

## 2015-10-20 ENCOUNTER — Telehealth: Payer: Self-pay | Admitting: Family

## 2015-10-20 VITALS — BP 160/90 | HR 90 | Temp 98.0°F | Resp 18 | Ht 71.0 in | Wt 280.8 lb

## 2015-10-20 DIAGNOSIS — M25561 Pain in right knee: Secondary | ICD-10-CM | POA: Diagnosis not present

## 2015-10-20 DIAGNOSIS — IMO0001 Reserved for inherently not codable concepts without codable children: Secondary | ICD-10-CM

## 2015-10-20 DIAGNOSIS — E1165 Type 2 diabetes mellitus with hyperglycemia: Secondary | ICD-10-CM

## 2015-10-20 DIAGNOSIS — I10 Essential (primary) hypertension: Secondary | ICD-10-CM

## 2015-10-20 DIAGNOSIS — Z23 Encounter for immunization: Secondary | ICD-10-CM | POA: Diagnosis not present

## 2015-10-20 LAB — BASIC METABOLIC PANEL
BUN: 10 mg/dL (ref 6–23)
CO2: 28 meq/L (ref 19–32)
Calcium: 9.2 mg/dL (ref 8.4–10.5)
Chloride: 102 mEq/L (ref 96–112)
Creatinine, Ser: 0.61 mg/dL (ref 0.40–1.50)
GFR: 183.71 mL/min (ref >60.00)
GLUCOSE: 97 mg/dL (ref 70–99)
POTASSIUM: 3.4 meq/L — AB (ref 3.5–5.1)
SODIUM: 137 meq/L (ref 135–145)

## 2015-10-20 LAB — HEMOGLOBIN A1C: HEMOGLOBIN A1C: 6.1 % (ref 4.6–6.5)

## 2015-10-20 MED ORDER — GLIMEPIRIDE 4 MG PO TABS: 4.0000 mg | ORAL_TABLET | Freq: Every day | ORAL | Status: DC

## 2015-10-20 NOTE — Assessment & Plan Note (Signed)
Symptoms and exam consistent with quadriceps tendinitis although cannot rule out intra-articular pathology. Treat conservatively with over-the-counter nonsteroidal anti-inflammatories as needed for discomfort, heat/ice, and home exercise therapy including stretching. Follow-up if symptoms worsen or fail to improve with conservative therapy.

## 2015-10-20 NOTE — Assessment & Plan Note (Signed)
Previously controlled blood pressure is elevated today greater than 140/90 with self discontinuation of hydrochlorothiazide secondary to excessive urination. Discussed and educated regarding replacing hydrochlorothiazide with another medication, and patient would like to wait at this time. Continue to monitor blood pressure as able. Continue current dosage of quinapril and amlodipine.

## 2015-10-20 NOTE — Telephone Encounter (Signed)
Please inform patient that his A1c is 6.1 which is excellent. Therefore please continue to take the metformin as prescribed with 1 pill once daily. We will also decrease his Amaryl down to 4 mg per day. This equates to 1 pill daily.  Otherwise his kidney function and electrolytes look good. We will recheck his A1c is about 3 months.

## 2015-10-20 NOTE — Patient Instructions (Addendum)
Thank you for choosing [facility].  Summary/Instructions:  Please continue to take your medications as prescribed.  Please stop by the lab on the basement level of the building for your blood work. Your results will be released to [facility] (or called to you) after review, usually within 72 hours after test completion. If any changes need to be made, you will be notified at that same time.  Pneumococcal Vaccine, Polyvalent solution for injection What is this medicine? PNEUMOCOCCAL VACCINE, POLYVALENT (NEU mo KOK al vak SEEN, pol ee VEY luhnt) is a vaccine to prevent pneumococcus bacteria infection. These bacteria are a major cause of ear infections, Strep throat infections, and serious pneumonia, meningitis, or blood infections worldwide. These vaccines help the body to produce antibodies (protective substances) that help your body defend against these bacteria. This vaccine is recommended for people 45 years of age and older with health problems. It is also recommended for all adults over 45 years old. This vaccine will not treat an infection. This medicine may be used for other purposes; ask your health care provider or pharmacist if you have questions. What should I tell my health care provider before I take this medicine? They need to know if you have any of these conditions: -bleeding problems -bone marrow or organ transplant -cancer, Hodgkin's disease -fever -infection -immune system problems -low platelet count in the blood -seizures -an unusual or allergic reaction to pneumococcal vaccine, diphtheria toxoid, other vaccines, latex, other medicines, foods, dyes, or preservatives -pregnant or trying to get pregnant -breast-feeding How should I use this medicine? This vaccine is for injection into a muscle or under the skin. It is given by a health care professional. A copy of Vaccine Information Statements will be given before each vaccination. Read this sheet carefully each  time. The sheet may change frequently. Talk to your pediatrician regarding the use of this medicine in children. While this drug may be prescribed for children as young as 45 years of age for selected conditions, precautions do apply. Overdosage: If you think you have taken too much of this medicine contact a poison control center or emergency room at once. NOTE: This medicine is only for you. Do not share this medicine with others. What if I miss a dose? It is important not to miss your dose. Call your doctor or health care professional if you are unable to keep an appointment. What may interact with this medicine? -medicines for cancer chemotherapy -medicines that suppress your immune function -medicines that treat or prevent blood clots like warfarin, enoxaparin, and dalteparin -steroid medicines like prednisone or cortisone This list may not describe all possible interactions. Give your health care provider a list of all the medicines, herbs, non-prescription drugs, or dietary supplements you use. Also tell them if you smoke, drink alcohol, or use illegal drugs. Some items may interact with your medicine. What should I watch for while using this medicine? Mild fever and pain should go away in 3 days or less. Report any unusual symptoms to your doctor or health care professional. What side effects may I notice from receiving this medicine? Side effects that you should report to your doctor or health care professional as soon as possible: -allergic reactions like skin rash, itching or hives, swelling of the face, lips, or tongue -breathing problems -confused -fever over 102 degrees F -pain, tingling, numbness in the hands or feet -seizures -unusual bleeding or bruising -unusual muscle weakness Side effects that usually do not require medical attention (report to  your doctor or health care professional if they continue or are bothersome): -aches and pains -diarrhea -fever of 102 degrees F  or less -headache -irritable -loss of appetite -pain, tender at site where injected -trouble sleeping This list may not describe all possible side effects. Call your doctor for medical advice about side effects. You may report side effects to FDA at 1-800-FDA-1088. Where should I keep my medicine? This does not apply. This vaccine is given in a clinic, pharmacy, doctor's office, or other health care setting and will not be stored at home. NOTE: This sheet is a summary. It may not cover all possible information. If you have questions about this medicine, talk to your doctor, pharmacist, or health care provider.    2016, Elsevier/Gold Standard. (2008-07-19 14:32:37)  Atorvastatin tablets What is this medicine? ATORVASTATIN (a TORE va sta tin) is known as a HMG-CoA reductase inhibitor or 'statin'. It lowers the level of cholesterol and triglycerides in the blood. This drug may also reduce the risk of heart attack, stroke, or other health problems in patients with risk factors for heart disease. Diet and lifestyle changes are often used with this drug. This medicine may be used for other purposes; ask your health care provider or pharmacist if you have questions. What should I tell my health care provider before I take this medicine? They need to know if you have any of these conditions: -frequently drink alcoholic beverages -history of stroke, TIA -kidney disease -liver disease -muscle aches or weakness -other medical condition -an unusual or allergic reaction to atorvastatin, other medicines, foods, dyes, or preservatives -pregnant or trying to get pregnant -breast-feeding How should I use this medicine? Take this medicine by mouth with a glass of water. Follow the directions on the prescription label. You can take this medicine with or without food. Take your doses at regular intervals. Do not take your medicine more often than directed. Talk to your pediatrician regarding the use of  this medicine in children. While this drug may be prescribed for children as young as 45 years old for selected conditions, precautions do apply. Overdosage: If you think you have taken too much of this medicine contact a poison control center or emergency room at once. NOTE: This medicine is only for you. Do not share this medicine with others. What if I miss a dose? If you miss a dose, take it as soon as you can. If it is almost time for your next dose, take only that dose. Do not take double or extra doses. What may interact with this medicine? Do not take this medicine with any of the following medications: -red yeast rice -telaprevir -telithromycin -voriconazole This medicine may also interact with the following medications: -alcohol -antiviral medicines for HIV or AIDS -boceprevir -certain antibiotics like clarithromycin, erythromycin, troleandomycin -certain medicines for cholesterol like fenofibrate or gemfibrozil -cimetidine -clarithromycin -colchicine -cyclosporine -digoxin -male hormones, like estrogens or progestins and birth control pills -grapefruit juice -medicines for fungal infections like fluconazole, itraconazole, ketoconazole -niacin -rifampin -spironolactone This list may not describe all possible interactions. Give your health care provider a list of all the medicines, herbs, non-prescription drugs, or dietary supplements you use. Also tell them if you smoke, drink alcohol, or use illegal drugs. Some items may interact with your medicine. What should I watch for while using this medicine? Visit your doctor or health care professional for regular check-ups. You may need regular tests to make sure your liver is working properly. Tell your doctor or health  care professional right away if you get any unexplained muscle pain, tenderness, or weakness, especially if you also have a fever and tiredness. Your doctor or health care professional may tell you to stop taking  this medicine if you develop muscle problems. If your muscle problems do not go away after stopping this medicine, contact your health care professional. This drug is only part of a total heart-health program. Your doctor or a dietician can suggest a low-cholesterol and low-fat diet to help. Avoid alcohol and smoking, and keep a proper exercise schedule. Do not use this drug if you are pregnant or breast-feeding. Serious side effects to an unborn child or to an infant are possible. Talk to your doctor or pharmacist for more information. This medicine may affect blood sugar levels. If you have diabetes, check with your doctor or health care professional before you change your diet or the dose of your diabetic medicine. If you are going to have surgery tell your health care professional that you are taking this drug. What side effects may I notice from receiving this medicine? Side effects that you should report to your doctor or health care professional as soon as possible: -allergic reactions like skin rash, itching or hives, swelling of the face, lips, or tongue -dark urine -fever -joint pain -muscle cramps, pain -redness, blistering, peeling or loosening of the skin, including inside the mouth -trouble passing urine or change in the amount of urine -unusually weak or tired -yellowing of eyes or skin Side effects that usually do not require medical attention (report to your doctor or health care professional if they continue or are bothersome): -constipation -heartburn -stomach gas, pain, upset This list may not describe all possible side effects. Call your doctor for medical advice about side effects. You may report side effects to FDA at 1-800-FDA-1088. Where should I keep my medicine? Keep out of the reach of children. Store at room temperature between 20 to 25 degrees C (68 to 77 degrees F). Throw away any unused medicine after the expiration date. NOTE: This sheet is a summary. It may  not cover all possible information. If you have questions about this medicine, talk to your doctor, pharmacist, or health care provider.    2016, Elsevier/Gold Standard. (2011-11-02 02:58:52)  Pravastatin tablets What is this medicine? PRAVASTATIN (PRA va stat in) is known as a HMG-CoA reductase inhibitor or 'statin'. It lowers the level of cholesterol and triglycerides in the blood. This drug may also reduce the risk of heart attack, stroke, or other health problems in patients with risk factors for heart disease. Diet and lifestyle changes are often used with this drug. This medicine may be used for other purposes; ask your health care provider or pharmacist if you have questions. What should I tell my health care provider before I take this medicine? They need to know if you have any of these conditions: -frequently drink alcoholic beverages -kidney disease -liver disease -muscle aches or weakness -other medical condition -an unusual or allergic reaction to pravastatin, other medicines, foods, dyes, or preservatives -pregnant or trying to get pregnant -breast-feeding How should I use this medicine? Take pravastatin tablets by mouth. Swallow the tablets with a drink of water. Pravastatin can be taken at anytime of the day, with or without food. Follow the directions on the prescription label. Take your doses at regular intervals. Do not take your medicine more often than directed. Talk to your pediatrician regarding the use of this medicine in children. Special care may  be needed. Pravastatin has been used in children as young as 30 years of age. Overdosage: If you think you have taken too much of this medicine contact a poison control center or emergency room at once. NOTE: This medicine is only for you. Do not share this medicine with others. What if I miss a dose? If you miss a dose, take it as soon as you can. If it is almost time for your next dose, take only that dose. Do not take  double or extra doses. What may interact with this medicine? Do not take this medicine with any of the following medications: -herbal medicines such as red yeast rice This medicine may also interact with the following medications: -alcohol -antiviral medicines for HIV or AIDS -certain medicines for fungal infections like ketoconazole and itraconazole -colchicine -cyclosporine -other medicines for high cholesterol -some antibiotics like clarithromycin, erythromycin, and telithromycin This list may not describe all possible interactions. Give your health care provider a list of all the medicines, herbs, non-prescription drugs, or dietary supplements you use. Also tell them if you smoke, drink alcohol, or use illegal drugs. Some items may interact with your medicine. What should I watch for while using this medicine? Visit your doctor or health care professional for regular check-ups. You may need regular tests to make sure your liver is working properly. Tell your doctor or health care professional right away if you get any unexplained muscle pain, tenderness, or weakness, especially if you also have a fever and tiredness. Your doctor or health care professional may tell you to stop taking this medicine if you develop muscle problems. If your muscle problems do not go away after stopping this medicine, contact your health care professional. This drug is only part of a total heart-health program. Your doctor or a dietician can suggest a low-cholesterol and low-fat diet to help. Avoid alcohol and smoking, and keep a proper exercise schedule. Do not use this drug if you are pregnant or breast-feeding. Serious side effects to an unborn child or to an infant are possible. Talk to your doctor or pharmacist for more information. This medicine may affect blood sugar levels. If you have diabetes, check with your doctor or health care professional before you change your diet or the dose of your diabetic  medicine. If you are going to have surgery tell your health care professional that you are taking this drug. What side effects may I notice from receiving this medicine? Side effects that you should report to your doctor or health care professional as soon as possible: -allergic reactions like skin rash, itching or hives, swelling of the face, lips, or tongue -dark urine -fever -muscle pain, cramps, or weakness -redness, blistering, peeling or loosening of the skin, including inside the mouth -trouble passing urine or change in the amount of urine -unusually weak or tired -yellowing of the eyes or skin Side effects that usually do not require medical attention (report to your doctor or health care professional if they continue or are bothersome): -gas -headache -heartburn -indigestion -stomach pain This list may not describe all possible side effects. Call your doctor for medical advice about side effects. You may report side effects to FDA at 1-800-FDA-1088. Where should I keep my medicine? Keep out of the reach of children. Store at room temperature between 15 to 30 degrees C (59 to 86 degrees F). Protect from light. Keep container tightly closed. Throw away any unused medicine after the expiration date. NOTE: This sheet is a summary.  It may not cover all possible information. If you have questions about this medicine, talk to your doctor, pharmacist, or health care provider.    2016, Elsevier/Gold Standard. (2011-11-02 10:39:37)

## 2015-10-20 NOTE — Assessment & Plan Note (Signed)
Diabetes appears to be adequately controlled based on home blood sugar readings. Obtain I6O, basic metabolic panel, and urinalysis. Flu shot updated today. Diabetic foot exam completed today. Due for a diabetic eye exam which will be scheduled independently. Discussed and educated regarding starting a statin medication for coronary artery disease reduction risk and also Pneumovax. Patient will notify office after discussing with his wife. Continue current dosage of metformin and glimepiride pending A1c results.

## 2015-10-20 NOTE — Progress Notes (Signed)
Pre visit review using our clinic review tool, if applicable. No additional management support is needed unless otherwise documented below in the visit note. 

## 2015-10-20 NOTE — Progress Notes (Signed)
Subjective:    Patient ID: Chris Watkins, male    DOB: 1971-11-04, 44 y.o.   MRN: [id number]  Chief Complaint  Patient presents with  . Follow-up    diabetes follow up, wants to know why he was told to take 1 metformin a day when he was taking 4 a day, needs A1c    HPI:  Chris Watkins is a 44 y.o. male who  has a past medical history of Diabetes mellitus without complication ([place]); Hypertension; and Chicken pox. and presents today for an office follow up.     1.) Diabetes - Currently maintained on metformin and glimepiride. Takes the medications as prescribed and denies adverse side effects and has a few occasions had lower blood sugars around the 60. Due for a pneumovax. Maintained on quinapril for CAD reduction. Not currently maintained on a statin. Due for a foot exam and eye exam.   Lab Results  Component Value Date   HGBA1C 8.0* 04/23/2015     2.) Knee pain - Associated symptom of pain located in his right knee has been going on for about 2 months. Denies any trauma or sounds/sensations heard or felt. Describes the pain as achy when it swells and no pain currently and when it is not swollen. Aggravating factors include going up stairs. Modifying treatments include Aleve which does help with the pain and inflammation. Severity of the pain stops him from walking as much as he would like.   3.) Hypertension - currently maintained on amlodipine, quinapril, and hydrochlorothiazide. Indicates he has ceased taking the hydrochlorothiazide secondary to having to use the bathroom too much. He continues to take the quinapril and amlodipine as prescribed without adverse side effects or hypertensive events. Does not currently take his blood pressure at home.  BP Readings from Last 3 Encounters:  10/20/15 160/90  04/23/15 140/80  01/27/15 158/81     No Known Allergies   Current Outpatient Prescriptions on File Prior to Visit  Medication Sig Dispense Refill  . amLODipine (NORVASC) 10  MG tablet Take 1 tablet (10 mg total) by mouth daily. 90 tablet 2  . blood glucose meter kit and supplies Dispense based on patient and insurance preference. Check fasting blood sugar daily. Diagnosis: Diabetes E11.9 1 each 0  . FREESTYLE LITE test strip TEST BLOOD SUGAR DAILY 100 each 0  . glimepiride (AMARYL) 4 MG tablet Take 2 tablets (8 mg total) by mouth daily with breakfast. PATIENT NEEDS OFFICE VISIT FOR ADDITIONAL REFILLS 180 tablet 2  . glucose monitoring kit (FREESTYLE) monitoring kit Check fasting BS daily   Dispense 100 lancets and test strips 1 each 0  . Lancets (FREESTYLE) lancets CHECK BS DAILY 100 each 0  . metFORMIN (GLUCOPHAGE-XR) 500 MG 24 hr tablet Take 1 tablet (500 mg total) by mouth daily with breakfast. 90 tablet 2  . quinapril (ACCUPRIL) 40 MG tablet Take 1 tablet (40 mg total) by mouth at bedtime. 90 tablet 2   No current facility-administered medications on file prior to visit.     Past Surgical History  Procedure Laterality Date  . Hernia repair    . Slipped capital femoral epiphysis pinning       Review of Systems  Constitutional: Negative for fever, chills and fatigue.  Eyes:       Negative for changes in vision.  Respiratory: Negative for chest tightness and shortness of breath.   Cardiovascular: Negative for chest pain, palpitations and leg swelling.  Endocrine: Negative for polydipsia,  polyphagia and polyuria.  Musculoskeletal:       Positive for right knee pain      Objective:    BP 160/90 mmHg  Pulse 90  Temp(Src) 98 F (36.7 C) (Oral)  Resp 18  Ht 5' 11"  (1.803 m)  Wt 280 lb 12.8 oz (127.37 kg)  BMI 39.18 kg/m2  SpO2 99% Nursing note and vital signs reviewed.  Physical Exam  Constitutional: He is oriented to person, place, and time. He appears well-developed and well-nourished. No distress.  Cardiovascular: Normal rate, regular rhythm, normal heart sounds and intact distal pulses.   Pulmonary/Chest: Effort normal and breath sounds  normal.  Musculoskeletal:  Right knee - moderate amount of edema with no other deformity or discoloration. Tenderness of quadriceps tendon superior to patella. Active and passive range of motion are within normal limits, however discomfort is felt with active knee extension and is dissipated with passive knee extension. Ligamentous and meniscal testing is negative. Distal pulses and sensation are intact and appropriate.  Neurological: He is alert and oriented to person, place, and time.  Diabetic foot exam - bilateral feet are free from skin breakdown, cuts, and abrasions. Toenails are neatly trimmed. Pulses are intact and appropriate. Sensation is intact to monofilament bilaterally.  Skin: Skin is warm and dry.  Psychiatric: He has a normal mood and affect. His behavior is normal. Judgment and thought content normal.       Assessment & Plan:   Problem List Items Addressed This Visit      Cardiovascular and Mediastinum   HTN (hypertension)    Previously controlled blood pressure is elevated today greater than 140/90 with self discontinuation of hydrochlorothiazide secondary to excessive urination. Discussed and educated regarding replacing hydrochlorothiazide with another medication, and patient would like to wait at this time. Continue to monitor blood pressure as able. Continue current dosage of quinapril and amlodipine.        Endocrine   Type II diabetes mellitus, uncontrolled ([place]) - Primary    Diabetes appears to be adequately controlled based on home blood sugar readings. Obtain M7J, basic metabolic panel, and urinalysis. Flu shot updated today. Diabetic foot exam completed today. Due for a diabetic eye exam which will be scheduled independently. Discussed and educated regarding starting a statin medication for coronary artery disease reduction risk and also Pneumovax. Patient will notify office after discussing with his wife. Continue current dosage of metformin and glimepiride pending  A1c results.      Relevant Orders   Hemoglobin A1c   Urinalysis, Routine w reflex microscopic   Basic Metabolic Panel (BMET)     Other   Right knee pain    Symptoms and exam consistent with quadriceps tendinitis although cannot rule out intra-articular pathology. Treat conservatively with over-the-counter nonsteroidal anti-inflammatories as needed for discomfort, heat/ice, and home exercise therapy including stretching. Follow-up if symptoms worsen or fail to improve with conservative therapy.

## 2015-10-21 ENCOUNTER — Telehealth: Payer: Self-pay | Admitting: Family

## 2015-10-21 NOTE — Telephone Encounter (Signed)
OV please

## 2015-10-21 NOTE — Telephone Encounter (Signed)
LVM for pt to call back.

## 2015-10-21 NOTE — Telephone Encounter (Signed)
Does patient need to come in to the lab in three months or make an OV in three months?

## 2015-10-23 NOTE — Telephone Encounter (Signed)
Left vm for patient to call back to schedule

## 2015-10-28 NOTE — Telephone Encounter (Signed)
Pt aware.

## 2015-10-30 ENCOUNTER — Other Ambulatory Visit: Payer: Self-pay | Admitting: Emergency Medicine

## 2016-02-03 ENCOUNTER — Other Ambulatory Visit: Payer: Self-pay | Admitting: Emergency Medicine

## 2016-04-29 ENCOUNTER — Telehealth: Payer: Self-pay | Admitting: Family

## 2016-04-29 ENCOUNTER — Other Ambulatory Visit (INDEPENDENT_AMBULATORY_CARE_PROVIDER_SITE_OTHER): Payer: BLUE CROSS/BLUE SHIELD

## 2016-04-29 ENCOUNTER — Ambulatory Visit (INDEPENDENT_AMBULATORY_CARE_PROVIDER_SITE_OTHER): Payer: BLUE CROSS/BLUE SHIELD | Admitting: Family

## 2016-04-29 ENCOUNTER — Encounter: Payer: Self-pay | Admitting: Family

## 2016-04-29 VITALS — BP 136/74 | HR 95 | Temp 98.2°F | Resp 18 | Ht 71.0 in | Wt 294.0 lb

## 2016-04-29 DIAGNOSIS — IMO0001 Reserved for inherently not codable concepts without codable children: Secondary | ICD-10-CM

## 2016-04-29 DIAGNOSIS — E1165 Type 2 diabetes mellitus with hyperglycemia: Secondary | ICD-10-CM | POA: Diagnosis not present

## 2016-04-29 DIAGNOSIS — B353 Tinea pedis: Secondary | ICD-10-CM

## 2016-04-29 LAB — BASIC METABOLIC PANEL
BUN: 14 mg/dL (ref 6–23)
CO2: 28 mEq/L (ref 19–32)
Calcium: 9.6 mg/dL (ref 8.4–10.5)
Chloride: 99 mEq/L (ref 96–112)
Creatinine, Ser: 0.76 mg/dL (ref 0.40–1.50)
GFR: 142.21 mL/min (ref >60.00)
Glucose, Bld: 89 mg/dL (ref 70–99)
POTASSIUM: 4 meq/L (ref 3.5–5.1)
Sodium: 136 mEq/L (ref 135–145)

## 2016-04-29 LAB — HEMOGLOBIN A1C: Hgb A1c MFr Bld: 7.6 % — ABNORMAL HIGH (ref 4.6–6.5)

## 2016-04-29 MED ORDER — CLOTRIMAZOLE-BETAMETHASONE 1-0.05 % EX CREA: 1.0000 "application " | TOPICAL_CREAM | Freq: Two times a day (BID) | CUTANEOUS | Status: DC

## 2016-04-29 MED ORDER — FLUCONAZOLE 150 MG PO TABS: 150.0000 mg | ORAL_TABLET | ORAL | Status: DC

## 2016-04-29 NOTE — Progress Notes (Signed)
Subjective:    Patient ID: Chris Watkins, male    DOB: 1971-02-28, 45 y.o.   MRN: [id number]  Chief Complaint  Patient presents with  . Follow-up    diabetes check    HPI:  Chris Watkins is a 44 y.o. male who  has a past medical history of Diabetes mellitus without complication ([place]); Hypertension; and Chicken pox. and presents today for a follow up office visit.   1.) Type 2 diabetes - Currently maintained on glimepiride and metformin. Reports taking the medication as prescribed and denies adverse side effects. Blood sugars at home have been averiging below 120. Denies symptoms of end organ damage. Endorses some fatigue.   Lab Results  Component Value Date   HGBA1C 7.6* 04/29/2016    2.) Tinea pedis - This is a new problem. Associated symptom of tinea pedis has been going on for several weeks and is described as itchy with some mild skin breakdown. Modifying factors include OTC antifungals which have not helped very much. Endorses that his feet do sweat a lot.  No Known Allergies   Current Outpatient Prescriptions on File Prior to Visit  Medication Sig Dispense Refill  . amLODipine (NORVASC) 10 MG tablet Take 1 tablet (10 mg total) by mouth daily. 90 tablet 2  . blood glucose meter kit and supplies Dispense based on patient and insurance preference. Check fasting blood sugar daily. Diagnosis: Diabetes E11.9 1 each 0  . FREESTYLE LITE test strip TEST BLOOD SUGAR DAILY 100 each 0  . glimepiride (AMARYL) 4 MG tablet Take 1 tablet (4 mg total) by mouth daily with breakfast. 90 tablet 0  . glucose monitoring kit (FREESTYLE) monitoring kit Check fasting BS daily   Dispense 100 lancets and test strips 1 each 0  . Lancets (FREESTYLE) lancets CHECK BS DAILY 100 each 0  . metFORMIN (GLUCOPHAGE-XR) 500 MG 24 hr tablet Take 1 tablet (500 mg total) by mouth daily with breakfast. 90 tablet 2  . quinapril (ACCUPRIL) 40 MG tablet Take 1 tablet (40 mg total) by mouth at bedtime. 90 tablet 2    No current facility-administered medications on file prior to visit.     Past Surgical History  Procedure Laterality Date  . Hernia repair    . Slipped capital femoral epiphysis pinning      Past Medical History  Diagnosis Date  . Diabetes mellitus without complication ([place])   . Hypertension   . Chicken pox      Review of Systems  Constitutional: Negative for fever and chills.  Respiratory: Negative for chest tightness and shortness of breath.   Cardiovascular: Negative for chest pain, palpitations and leg swelling.  Skin: Positive for rash.  Neurological: Negative for weakness, numbness and headaches.      Objective:    BP 136/74 mmHg  Pulse 95  Temp(Src) 98.2 F (36.8 C) (Oral)  Resp 18  Ht 5' 11"  (1.803 m)  Wt 294 lb (133.358 kg)  BMI 41.02 kg/m2  SpO2 96% Nursing note and vital signs reviewed.  Physical Exam  Constitutional: He is oriented to person, place, and time. He appears well-developed and well-nourished. No distress.  Cardiovascular: Normal rate, regular rhythm, normal heart sounds and intact distal pulses.   Pulmonary/Chest: Effort normal and breath sounds normal.  Neurological: He is alert and oriented to person, place, and time.  Diabetic foot exam - bilateral feet are free from skin breakdown, cuts, and abrasions and Tinea pedis noted. Toenails are neatly trimmed. Pulses are  intact and appropriate. Sensation is intact to monofilament bilaterally.  Skin: Skin is warm and dry.  Moccasin type tinea pedis located around his bilateral feet with infection noted between his toes.   Psychiatric: He has a normal mood and affect. His behavior is normal. Judgment and thought content normal.       Assessment & Plan:   Problem List Items Addressed This Visit      Endocrine   Type II diabetes mellitus, uncontrolled ([place]) - Primary    Type 2 diabetes remains well controlled with current regimen as noted through home blood sugar readings. Noted a few blood  sugar readings in the 70's. Obtain A1c and BMET. Continue current dosage of glimepiride and metformin. Hold glimepiride for low blood sugars. Maintained on quinapril for CAD risk reduction. Foot exam completed today. Encouraged to complete diabetic eye exam. Declines Pneumovax. Follow up in 6 months or sooner pending blood work.       Relevant Orders   Hemoglobin A1c (Completed)   Basic Metabolic Panel (BMET) (Completed)     Musculoskeletal and Integument   Tinea pedis    Symptoms and exam consistent with tinea pedis refractory to over-the-counter medications. Start Lotrisone and fluconazole. Advised to try to keep feet dry through use of antiperspirant or antifungal powders. Follow-up if symptoms worsen or do not improve.      Relevant Medications   fluconazole (DIFLUCAN) 150 MG tablet   clotrimazole-betamethasone (LOTRISONE) cream       I am having Chris Watkins start on fluconazole and clotrimazole-betamethasone. I am also having him maintain his glucose monitoring kit, blood glucose meter kit and supplies, freestyle, FREESTYLE LITE, amLODipine, metFORMIN, quinapril, glimepiride, and hydrochlorothiazide.   Meds ordered this encounter  Medications  . hydrochlorothiazide (HYDRODIURIL) 25 MG tablet    Sig: Take 25 mg by mouth daily.  . fluconazole (DIFLUCAN) 150 MG tablet    Sig: Take 1 tablet (150 mg total) by mouth once a week.    Dispense:  3 tablet    Refill:  1    Order Specific Question:  Supervising Provider    Answer:  Pricilla Holm A [[id number]]  . clotrimazole-betamethasone (LOTRISONE) cream    Sig: Apply 1 application topically 2 (two) times daily.    Dispense:  30 g    Refill:  0    Order Specific Question:  Supervising Provider    Answer:  Pricilla Holm A [[id number]]     Follow-up: Return in about 6 months (around 10/30/2016), or if symptoms worsen or fail to improve.  Mauricio Po, FNP

## 2016-04-29 NOTE — Assessment & Plan Note (Signed)
Type 2 diabetes remains well controlled with current regimen as noted through home blood sugar readings. Noted a few blood sugar readings in the 70's. Obtain A1c and BMET. Continue current dosage of glimepiride and metformin. Hold glimepiride for low blood sugars. Maintained on quinapril for CAD risk reduction. Foot exam completed today. Encouraged to complete diabetic eye exam. Declines Pneumovax. Follow up in 6 months or sooner pending blood work.

## 2016-04-29 NOTE — Patient Instructions (Signed)
Thank you for choosing [facility].  Summary/Instructions:  Please continue to take your medications as prescribed.   If your blood sugar is low in the morning DO NOT TAKE THE GLIMEPIRIDE.  Start the Lotrisone for your feet.  Start the fluconazole 1x weekly. If after 3 weeks there is some improvement but no resolution, take for another 3 weeks. '  They will call to schedule your appointment for a sleep study.  Your prescription(s) have been submitted to your pharmacy or been printed and provided for you. Please take as directed and contact our office if you believe you are having problem(s) with the medication(s) or have any questions.  Please stop by the lab on the basement level of the building for your blood work. Your results will be released to [facility] (or called to you) after review, usually within 72 hours after test completion. If any changes need to be made, you will be notified at that same time.  If your symptoms worsen or fail to improve, please contact our office for further instruction, or in case of emergency go directly to the emergency room at the closest medical facility.

## 2016-04-29 NOTE — Progress Notes (Signed)
Pre visit review using our clinic review tool, if applicable. No additional management support is needed unless otherwise documented below in the visit note. 

## 2016-04-29 NOTE — Telephone Encounter (Signed)
Please inform patient that his A1c has increased from 6.1 to 7.6 indicating slightly worsening control. Therefore please continue to take both the metformin and the glimepiride daily. Otherwise his kidney function and electrolytes were normal. Plan to repeat an A1c in 6 months.

## 2016-04-29 NOTE — Assessment & Plan Note (Signed)
Symptoms and exam consistent with tinea pedis refractory to over-the-counter medications. Start Lotrisone and fluconazole. Advised to try to keep feet dry through use of antiperspirant or antifungal powders. Follow-up if symptoms worsen or do not improve.

## 2016-04-30 NOTE — Telephone Encounter (Signed)
LVM letting pt know results. 

## 2016-05-10 ENCOUNTER — Other Ambulatory Visit: Payer: Self-pay | Admitting: Family

## 2016-06-23 ENCOUNTER — Ambulatory Visit (INDEPENDENT_AMBULATORY_CARE_PROVIDER_SITE_OTHER): Payer: BLUE CROSS/BLUE SHIELD | Admitting: Family

## 2016-06-23 ENCOUNTER — Encounter: Payer: Self-pay | Admitting: Family

## 2016-06-23 VITALS — BP 152/82 | HR 102 | Temp 98.4°F | Resp 16 | Ht 71.0 in | Wt 298.0 lb

## 2016-06-23 DIAGNOSIS — M25562 Pain in left knee: Secondary | ICD-10-CM

## 2016-06-23 DIAGNOSIS — S83419A Sprain of medial collateral ligament of unspecified knee, initial encounter: Secondary | ICD-10-CM | POA: Insufficient documentation

## 2016-06-23 MED ORDER — DICLOFENAC SODIUM 2 % TD SOLN: 1.0000 "application " | Freq: Two times a day (BID) | TRANSDERMAL | Status: DC | PRN

## 2016-06-23 MED ORDER — IBUPROFEN-FAMOTIDINE 800-26.6 MG PO TABS: 1.0000 | ORAL_TABLET | Freq: Three times a day (TID) | ORAL | Status: DC | PRN

## 2016-06-23 NOTE — Assessment & Plan Note (Signed)
No significant cause of edema noted on ultrasound. Most likely related to prolonged standing at work and body weigh resulting in edema of the knee. Discussed treatment options including conservative treatment and possible cortisone injection. Patient wishes to continue with conservative treatment for the next 2 weeks with Pennsaid, Vimovo, ice therapy, and home exercise regimen. If symptoms worsen or do not improve consider cortisone injection or further imaging.

## 2016-06-23 NOTE — Progress Notes (Signed)
Subjective:    Patient ID: Chris Watkins, male    DOB: 11-26-1971, 45 y.o.   MRN: [id number]  Chief Complaint  Patient presents with  . Knee Pain    left knee swollen and painful, it has been bothering him for over a month now    HPI:  Chris Watkins is a 45 y.o. male who  has a past medical history of Diabetes mellitus without complication ([place]); Hypertension; and Chicken pox. and presents today for an office visit.   Knee pain - This is a new problem. Associated symptom of pain located in his left knee has been going on for about 1 month. Pain is described as achy and constant. No trauma but does work on a [employer]. Difficult to bend secondary to swelling. Aggravated by walking up steps. Modifying factors include ibuprofen, Aleve and ice packs which have helped briefly but the symptoms returned. No numbness and tingling.   No Known Allergies   Current Outpatient Prescriptions on File Prior to Visit  Medication Sig Dispense Refill  . amLODipine (NORVASC) 10 MG tablet Take 1 tablet (10 mg total) by mouth daily. 90 tablet 2  . blood glucose meter kit and supplies Dispense based on patient and insurance preference. Check fasting blood sugar daily. Diagnosis: Diabetes E11.9 1 each 0  . clotrimazole-betamethasone (LOTRISONE) cream APPLY 1 APPLICATION TOPICALLY 2 (TWO) TIMES DAILY. 30 g 0  . fluconazole (DIFLUCAN) 150 MG tablet Take 1 tablet (150 mg total) by mouth once a week. 3 tablet 1  . FREESTYLE LITE test strip TEST BLOOD SUGAR DAILY 100 each 0  . glimepiride (AMARYL) 4 MG tablet Take 1 tablet (4 mg total) by mouth daily with breakfast. 90 tablet 0  . glucose monitoring kit (FREESTYLE) monitoring kit Check fasting BS daily   Dispense 100 lancets and test strips 1 each 0  . hydrochlorothiazide (HYDRODIURIL) 25 MG tablet Take 25 mg by mouth daily.    . Lancets (FREESTYLE) lancets CHECK BS DAILY 100 each 0  . metFORMIN (GLUCOPHAGE-XR) 500 MG 24 hr tablet Take 1 tablet (500 mg  total) by mouth daily with breakfast. 90 tablet 2  . quinapril (ACCUPRIL) 40 MG tablet Take 1 tablet (40 mg total) by mouth at bedtime. 90 tablet 2   No current facility-administered medications on file prior to visit.     Past Surgical History  Procedure Laterality Date  . Hernia repair    . Slipped capital femoral epiphysis pinning      Past Medical History  Diagnosis Date  . Diabetes mellitus without complication ([place])   . Hypertension   . Chicken pox      Review of Systems  Constitutional: Negative for fever and chills.  Musculoskeletal:       Positive for left knee pain.  Neurological: Negative for weakness and numbness.      Objective:    BP 152/82 mmHg  Pulse 102  Temp(Src) 98.4 F (36.9 C) (Oral)  Resp 16  Ht 5' 11"  (1.803 m)  Wt 298 lb (135.172 kg)  BMI 41.58 kg/m2  SpO2 97% Nursing note and vital signs reviewed.  Physical Exam  Constitutional: He is oriented to person, place, and time. He appears well-developed and well-nourished. No distress.  Cardiovascular: Normal rate, regular rhythm, normal heart sounds and intact distal pulses.   Pulmonary/Chest: Effort normal and breath sounds normal.  Musculoskeletal:  Left knee - mild to moderate edema with no obvious deformity or discoloration. There is mild increased  temperature compared to the contralateral side. Tenderness elicited over medial and lateral joint lines as well as posterior in the popliteal fossa. Range of motion is within normal limits with slight decrease in flexion secondary to tightness. Ligamentous and meniscal testing are negative. Distal pulses and sensation are intact and appropriate.  Neurological: He is alert and oriented to person, place, and time.  Skin: Skin is warm and dry.  Psychiatric: He has a normal mood and affect. His behavior is normal. Judgment and thought content normal.   Examination: Ultrasound of knee Date:  06/23/2016 Patient Name: Chris Watkins History: Generalized  knee pain and edema of the left knee with no trauma.  Findings:  The extensor mechanism, including the quadriceps tendon, patella, and patellar tendon is normal without bursal abnormalities. Mild joint effusion with no synovial hypertrophy. The medial collateral and lateral collateral ligaments are normal. Unremarkable iliotibial tract, biceps femoris, popliteus tendon, and common peroneal nerve. No Baker cyst. Limited evaluation of the menisci is unremarkable.  Impression:  Mild joint effusion otherwise unremarkable ultrasound of the knee.          All images are located under the media tab. [place] ordered, performed and interpreted by Terri Piedra, FNP      Assessment & Plan:   Problem List Items Addressed This Visit      Other   Left knee pain - Primary    No significant cause of edema noted on ultrasound. Most likely related to prolonged standing at work and body weigh resulting in edema of the knee. Discussed treatment options including conservative treatment and possible cortisone injection. Patient wishes to continue with conservative treatment for the next 2 weeks with Pennsaid, Vimovo, ice therapy, and home exercise regimen. If symptoms worsen or do not improve consider cortisone injection or further imaging.      Relevant Medications   Ibuprofen-Famotidine 800-26.6 MG TABS   Diclofenac Sodium (PENNSAID) 2 % SOLN       I am having Mr. Massiah start on Ibuprofen-Famotidine and Diclofenac Sodium. I am also having him maintain his glucose monitoring kit, blood glucose meter kit and supplies, freestyle, FREESTYLE LITE, amLODipine, metFORMIN, quinapril, glimepiride, hydrochlorothiazide, fluconazole, and clotrimazole-betamethasone.   Meds ordered this encounter  Medications  . Ibuprofen-Famotidine 800-26.6 MG TABS    Sig: Take 1 tablet by mouth 3 (three) times daily as needed.    Dispense:  90 tablet    Refill:  1    Order Specific Question:  Supervising Provider    Answer:   Pricilla Holm A [[id number]]  . Diclofenac Sodium (PENNSAID) 2 % SOLN    Sig: Place 1 application onto the skin 2 (two) times daily as needed.    Dispense:  112 g    Refill:  1    Order Specific Question:  Supervising Provider    Answer:  Pricilla Holm A [[id number]]     Follow-up: Return in about 2 weeks (around 07/07/2016), or if symptoms worsen or fail to improve.  Mauricio Po, FNP

## 2016-06-23 NOTE — Progress Notes (Signed)
Pre visit review using our clinic review tool, if applicable. No additional management support is needed unless otherwise documented below in the visit note. 

## 2016-06-23 NOTE — Patient Instructions (Signed)
Thank you for choosing [facility].  Summary/Instructions:  Ice x 20 minutes every 2 hours as needed and after activity.  Duexis - 1 tablet by mouth 3x daily for 1 week  Pennsaid - 1/2 packet to the knee twice daily  Exercises daily.  Tumeric 500 mg daily for inflammation.   Your prescription(s) have been submitted to your pharmacy or been printed and provided for you. Please take as directed and contact our office if you believe you are having problem(s) with the medication(s) or have any questions.  If your symptoms worsen or fail to improve, please contact our office for further instruction, or in case of emergency go directly to the emergency room at the closest medical facility.   Generic Knee Exercises EXERCISES RANGE OF MOTION (ROM) AND STRETCHING EXERCISES These exercises may help you when beginning to rehabilitate your injury. Your symptoms may resolve with or without further involvement from your physician, physical therapist, or athletic trainer. While completing these exercises, remember:   Restoring tissue flexibility helps normal motion to return to the joints. This allows healthier, less painful movement and activity.  An effective stretch should be held for at least 30 seconds.  A stretch should never be painful. You should only feel a gentle lengthening or release in the stretched tissue. STRETCH - Knee Extension, Prone  Lie on your stomach on a firm surface, such as a bed or countertop. Place your right / left knee and leg just beyond the edge of the surface. You may wish to place a towel under the far end of your right / left thigh for comfort.  Relax your leg muscles and allow gravity to straighten your knee. Your clinician may advise you to add an ankle weight if more resistance is helpful for you.  You should feel a stretch in the back of your right / left knee. Hold this position for __________ seconds. Repeat __________ times. Complete this stretch  __________ times per day. * Your physician, physical therapist, or athletic trainer may ask you to add ankle weight to enhance your stretch.  RANGE OF MOTION - Knee Flexion, Active  Lie on your back with both knees straight. (If this causes back discomfort, bend your opposite knee, placing your foot flat on the floor.)  Slowly slide your heel back toward your buttocks until you feel a gentle stretch in the front of your knee or thigh.  Hold for __________ seconds. Slowly slide your heel back to the starting position. Repeat __________ times. Complete this exercise __________ times per day.  STRETCH - Quadriceps, Prone   Lie on your stomach on a firm surface, such as a bed or padded floor.  Bend your right / left knee and grasp your ankle. If you are unable to reach your ankle or pant leg, use a belt around your foot to lengthen your reach.  Gently pull your heel toward your buttocks. Your knee should not slide out to the side. You should feel a stretch in the front of your thigh and/or knee.  Hold this position for __________ seconds. Repeat __________ times. Complete this stretch __________ times per day.  STRETCH - Hamstrings, Supine   Lie on your back. Loop a belt or towel over the ball of your right / left foot.  Straighten your right / left knee and slowly pull on the belt to raise your leg. Do not allow the right / left knee to bend. Keep your opposite leg flat on the floor.  Raise  the leg until you feel a gentle stretch behind your right / left knee or thigh. Hold this position for __________ seconds. Repeat __________ times. Complete this stretch __________ times per day.  STRENGTHENING EXERCISES These exercises may help you when beginning to rehabilitate your injury. They may resolve your symptoms with or without further involvement from your physician, physical therapist, or athletic trainer. While completing these exercises, remember:   Muscles can gain both the endurance  and the strength needed for everyday activities through controlled exercises.  Complete these exercises as instructed by your physician, physical therapist, or athletic trainer. Progress the resistance and repetitions only as guided.  You may experience muscle soreness or fatigue, but the pain or discomfort you are trying to eliminate should never worsen during these exercises. If this pain does worsen, stop and make certain you are following the directions exactly. If the pain is still present after adjustments, discontinue the exercise until you can discuss the trouble with your clinician. STRENGTH - Quadriceps, Isometrics  Lie on your back with your right / left leg extended and your opposite knee bent.  Gradually tense the muscles in the front of your right / left thigh. You should see either your knee cap slide up toward your hip or increased dimpling just above the knee. This motion will push the back of the knee down toward the floor/mat/bed on which you are lying.  Hold the muscle as tight as you can without increasing your pain for __________ seconds.  Relax the muscles slowly and completely in between each repetition. Repeat __________ times. Complete this exercise __________ times per day.  STRENGTH - Quadriceps, Short Arcs   Lie on your back. Place a __________ inch towel roll under your knee so that the knee slightly bends.  Raise only your lower leg by tightening the muscles in the front of your thigh. Do not allow your thigh to rise.  Hold this position for __________ seconds. Repeat __________ times. Complete this exercise __________ times per day.  OPTIONAL ANKLE WEIGHTS: Begin with ____________________, but DO NOT exceed ____________________. Increase in 1 pound/0.5 kilogram increments.  STRENGTH - Quadriceps, Straight Leg Raises  Quality counts! Watch for signs that the quadriceps muscle is working to insure you are strengthening the correct muscles and not "cheating" by  substituting with healthier muscles.  Lay on your back with your right / left leg extended and your opposite knee bent.  Tense the muscles in the front of your right / left thigh. You should see either your knee cap slide up or increased dimpling just above the knee. Your thigh may even quiver.  Tighten these muscles even more and raise your leg 4 to 6 inches off the floor. Hold for __________ seconds.  Keeping these muscles tense, lower your leg.  Relax the muscles slowly and completely in between each repetition. Repeat __________ times. Complete this exercise __________ times per day.  STRENGTH - Hamstring, Curls  Lay on your stomach with your legs extended. (If you lay on a bed, your feet may hang over the edge.)  Tighten the muscles in the back of your thigh to bend your right / left knee up to 90 degrees. Keep your hips flat on the bed/floor.  Hold this position for __________ seconds.  Slowly lower your leg back to the starting position. Repeat __________ times. Complete this exercise __________ times per day.  OPTIONAL ANKLE WEIGHTS: Begin with ____________________, but DO NOT exceed ____________________. Increase in 1 pound/0.5 kilogram increments.  STRENGTH - Quadriceps, Squats  Stand in a door frame so that your feet and knees are in line with the frame.  Use your hands for balance, not support, on the frame.  Slowly lower your weight, bending at the hips and knees. Keep your lower legs upright so that they are parallel with the door frame. Squat only within the range that does not increase your knee pain. Never let your hips drop below your knees.  Slowly return upright, pushing with your legs, not pulling with your hands. Repeat __________ times. Complete this exercise __________ times per day.  STRENGTH - Quadriceps, Wall Slides  Follow guidelines for form closely. Increased knee pain often results from poorly placed feet or knees.  Lean against a smooth wall or door  and walk your feet out 18-24 inches. Place your feet hip-width apart.  Slowly slide down the wall or door until your knees bend __________ degrees.* Keep your knees over your heels, not your toes, and in line with your hips, not falling to either side.  Hold for __________ seconds. Stand up to rest for __________ seconds in between each repetition. Repeat __________ times. Complete this exercise __________ times per day. * Your physician, physical therapist, or athletic trainer will alter this angle based on your symptoms and progress.   This information is not intended to replace advice given to you by your health care provider. Make sure you discuss any questions you have with your health care provider.   Document Released: 10/27/2005 Document Revised: 01/03/2015 Document Reviewed: 03/27/2009 Elsevier Interactive Patient Education [employer].

## 2016-07-11 ENCOUNTER — Other Ambulatory Visit: Payer: Self-pay | Admitting: Family

## 2016-08-20 ENCOUNTER — Ambulatory Visit (INDEPENDENT_AMBULATORY_CARE_PROVIDER_SITE_OTHER): Payer: BLUE CROSS/BLUE SHIELD | Admitting: Family

## 2016-08-20 ENCOUNTER — Encounter: Payer: Self-pay | Admitting: Family

## 2016-08-20 DIAGNOSIS — S83412A Sprain of medial collateral ligament of left knee, initial encounter: Secondary | ICD-10-CM

## 2016-08-20 MED ORDER — IBUPROFEN-FAMOTIDINE 800-26.6 MG PO TABS: 1.0000 | ORAL_TABLET | Freq: Three times a day (TID) | ORAL | 1 refills | Status: DC | PRN

## 2016-08-20 MED ORDER — DICLOFENAC SODIUM 2 % TD SOLN: 1.0000 "application " | Freq: Two times a day (BID) | TRANSDERMAL | 1 refills | Status: DC | PRN

## 2016-08-20 MED ORDER — CLOTRIMAZOLE-BETAMETHASONE 1-0.05 % EX CREA: TOPICAL_CREAM | CUTANEOUS | 0 refills | Status: DC

## 2016-08-20 NOTE — Patient Instructions (Addendum)
Thank you for choosing [facility]!  Ice x 20 minutes every 2 hours and as needed or following activity  Pennsaid - Approximately 1/2 packet to the affected site twice daily.  Duexis - 1 tablet 3 times per day for the next 5-7 days and then as needed.  Exercises 1-2 times per day as instructed.   You will receive a call from [facility] regarding your Pennsaid/Duexis/Vimovo. The medication will be mailed to you and should cost you no more than $10 per item or possibly free depending upon your insurance.   Your prescription(s) have been submitted to your pharmacy or been printed and provided for you. Please take as directed and contact our office if you believe you are having problem(s) with the medication(s) or have any questions.  If your symptoms worsen or fail to improve, please contact our office for further instruction, or in case of emergency go directly to the emergency room at the closest medical facility.    Medial Collateral Knee Ligament Sprain With Phase I Rehab The medial collateral ligament (MCL) of the knee helps hold the knee joint in proper alignment and prevents the bones from shifting out of alignment (displacing) to the inside (medially). Injury to the knee may cause a tear in the MCL ligament (sprain). Sprains may heal without treatment, but this often results in a loose joint. Sprains are classified into three categories. Grade 1 sprains cause pain, but the tendon is not lengthened. Grade 2 sprains include a lengthened ligament, due to the ligament being stretched or partially ruptured. With grade 2 sprains, there is still function, although possibly decreased. Grade 3 sprains involve a complete tear of the tendon or muscle, and function is usually impaired. SYMPTOMS   Pain and tenderness on the inner side of the knee.  A "pop," tearing or pulling sensation at the time of injury.  Bruising (contusion) at the site of injury, within 48 hours of injury.  Knee  stiffness.  Limping, often walking with the knee bent. CAUSES  An MCL sprain occurs when a force is placed on the ligament that is greater than it can handle. Common mechanisms of injury include:  Direct hit (trauma) to the outer side of the knee, especially if the foot is planted on the ground.  Forceful pivoting of the body and leg, while the foot is planted on the ground. RISK INCREASES WITH:  Contact sports (football, rugby).  Sports that require pivoting or cutting (soccer).  Poor knee strength and flexibility.  Improper equipment use. PREVENTION  Warm up and stretch properly before activity.  Maintain physical fitness:  Strength, flexibility and endurance.  Cardiovascular fitness.  Wear properly fitted protective equipment (correct length of cleats for surface).  Functional braces may be effective in preventing injury. PROGNOSIS  MCL tears usually heal without the need for surgery. Sometimes however, surgery is required. RELATED COMPLICATIONS  Frequently recurring symptoms, such as the knee giving way, knee instability or knee swelling.  Injury to other structures in the knee joint:  Meniscal cartilage, resulting in locking and swelling of the knee.  Articular cartilage, resulting in knee arthritis.  Other ligaments of the knee.  Injury to nerves, resulting in numbness of the outer leg, foot or ankle and weakness or paralysis, with inability to raise the ankle or toes.  Knee stiffness. TREATMENT Treatment first involves the use of ice and medicine, to reduce pain and inflammation. The use of strengthening and stretching exercises may help reduce pain with activity. These exercises  may be performed at home, but referral to a therapist is often advised. You may be advised to walk with crutches until you are able to walk without a limp. Your caregiver may provide you with a hinged knee brace to help regain a full range of motion, while also protecting the injured  knee. For severe MCL injuries or injuries that involve other ligaments of the knee, surgery is often advised. MEDICATION  Do not take pain medicine for 7 days before surgery.  Only use over-the-counter pain medicine as directed by your caregiver.  Only use prescription pain relievers as directed and only in needed amounts. HEAT AND COLD  Cold treatment (icing) should be applied for 10 to 15 minutes every 2 to 3 hours for inflammation and pain, and immediately after any activity, that aggravates the symptoms. Use ice packs or an ice massage.  Heat treatment may be used before performing stretching and strengthening activities prescribed by your caregiver, physical therapist or athletic trainer. Use a heat pack or warm water soak. SEEK MEDICAL CARE IF:   Symptoms get worse or do not improve in 4 to 6 weeks, despite treatment.  New, unexplained symptoms develop. EXERCISES  PHASE I EXERCISES  RANGE OF MOTION (ROM) AND STRETCHING EXERCISES-Medial Collateral Knee Ligament Sprain Phase I These are some of the initial exercises that your physician, physical therapist or athletic trainer may have you perform to begin your rehabilitation. When you demonstrate gains in your flexibility and strength, your caregiver may progress you to Phase II exercises. As you perform these exercises, remember:  These initial exercises are intended to be gentle. They will help you restore motion without increasing any swelling.  Completing these exercises allows less painful movement and prepares you for the more aggressive strengthening exercises in Phase II.  An effective stretch should be held for at least 30 seconds.  A stretch should never be painful. You should only feel a gentle lengthening or release in the stretched tissue. RANGE OF MOTION-Knee Flexion, Active  Lie on your back with both knees straight. (If this causes back discomfort, bend your healthy knee, placing your foot flat on the  floor.)  Slowly slide your heel back toward your buttocks until you feel a gentle stretch in the front of your knee or thigh.  Hold for __________ seconds. Slowly slide your heel back to the starting position. Repeat __________ times. Complete this exercise __________ times per day. STRETCH-Knee Flexion, Supine  Lie on the floor with your right / left heel and foot lightly touching the wall. (Place both feet on the wall if you do not use a door frame.)  Without using any effort, allow gravity to slide your foot down the wall slowly until you feel a gentle stretch in the front of your right / left knee.  Hold this stretch for __________ seconds. Then return the leg to the starting position, using your health leg for help, if needed. Repeat __________ times. Complete this stretch __________ times per day. RANGE OF MOTION-Knee Flexion and Extension, Active-Assisted  Sit on the edge of a table or chair with your thighs firmly supported. It may be helpful to place a folded towel under the end of your right / left thigh.  Flexion (bending): Place the ankle of your healthy leg on top of the other ankle. Use your healthy leg to gently bend your right / left knee until you feel a mild tension across the top of your knee.  Hold for __________ seconds.  Extension (straightening): Switch your ankles so your right / left leg is on top. Use your healthy leg to straighten your right / left knee until you feel a mild tension on the backside of your knee.  Hold for __________ seconds. Repeat __________ times. Complete this exercise __________ times per day. STRETCH-Knee Extension Sitting  Sit with your right / left leg/heel propped on another chair, coffee table, or foot stool.  Allow your leg muscles to relax, letting gravity straighten out your knee.*  You should feel a stretch behind your right / left knee. Hold this position for __________ seconds. Repeat __________ times. Complete this stretch  __________ times per day. *Your physician, physical therapist or athletic trainer may instruct you to place a __________ weight on your thigh, just above your kneecap, to deepen the stretch. STRENGTHENING EXERCISES-Medial Collateral Knee Ligament Sprain Phase I These exercises may help you when beginning to rehabilitate your injury. They may resolve your symptoms with or without further involvement from your physician, physical therapist or athletic trainer. While completing these exercises, remember:   In order to return to more demanding activities, you will likely need to progress to more challenging exercises. Your physician, physical therapist or athletic trainer will advance your exercises when your tissues show adequate healing and your muscles demonstrate increased strength.  Muscles can gain both the endurance and the strength needed for everyday activities through controlled exercises.  Complete these exercises as instructed by your physician, physical therapist or athletic trainer. Increase the resistance and repetitions only as guided by your caregiver. STRENGTH-Quadriceps, Isometrics  Lie on your back with your right / left leg extended and your opposite knee bent.  Gradually tense the muscles in the front of your right / left thigh. You should see either your kneecap slide up toward your hip or an increased dimpling just above the knee. This motion will push the back of the knee down toward the floor, mat or bed on which you are lying.  Hold the muscle as tight as you can without increasing your pain for __________ seconds.  Relax the muscles slowly and completely in between each repetition. Repeat __________ times. Complete this exercise __________ times per day. STRENGTH-Quadriceps, Short Arcs  Lie on your back. Place a __________ inch towel roll under your knee so that the knee slightly bends.  Raise only your lower leg by tightening the muscles in the front of your thigh. Do  not allow your thigh to rise.  Hold this position for __________ seconds. Repeat __________ times. Complete this exercise __________ times per day. OPTIONAL ANKLE WEIGHTS: Begin with ____________________, but DO NOT exceed ____________________. Increase in 1 pound/0.5 kilogram increments.  STRENGTH--Quadriceps, Straight Leg Raises Quality counts! Watch for signs that the quadriceps muscle is working, to be sure you are strengthening the correct muscles and not "cheating" by substituting with healthier muscles.  Lay on your back with your right / left leg extended and your opposite knee bent.  Tense the muscles in the front of your right / left thigh. You should see either your knee cap slide up or increased dimpling just above the knee. Your thigh may even shake a bit.  Tighten these muscles even more and raise your leg 4 to 6 inches off the floor. Hold for __________ seconds.  Keeping these muscles tense, lower your leg.  Relax the muscles slowly and completely in between each repetition. Repeat __________ times. Complete this exercise __________ times per day. STRENGTH-Hamstring, Isometrics  Lie on your  back on a firm surface.  Bend your right / left knee approximately __________ degrees.  Dig your heel into the surface as if you are trying to pull it toward your buttocks. Tighten the muscles in the back of your thighs to "dig" as hard as you can, without increasing any pain.  Hold this position for __________ seconds.  Release the tension gradually and allow your muscle to completely relax for __________ seconds in between each exercise. Repeat __________ times. Complete this exercise __________ times per day. STRENGTH-Hamstring, Curls  Lay on your stomach with your legs extended. (If you lay on a bed, your feet may hang over the edge.)  Tighten the muscles in the back of your thigh to bend your right / left knee up to 90 degrees. Keep your hips flat on the bed.  Hold this  position for __________ seconds.  Slowly lower your leg back to the starting position. Repeat __________ times. Complete this exercise __________ times per day. OPTIONAL ANKLE WEIGHTS: Begin with ____________________, but DO NOT exceed ____________________. Increase in 1 pound/0.5 kilogram increments.    This information is not intended to replace advice given to you by your health care provider. Make sure you discuss any questions you have with your health care provider.   Document Released: 12/13/2005 Document Revised: 01/03/2015 Document Reviewed: 03/27/2009 Elsevier Interactive Patient Education [employer].

## 2016-08-20 NOTE — Assessment & Plan Note (Signed)
Symptoms and exam consistent with mild to moderate MCL sprain with no significant laxity. Treat conservatively with ice, Duexsis and Pennsaid, and home exercise therapy. Knee brace as needed. Follow up if symptoms do not improve.

## 2016-08-20 NOTE — Progress Notes (Signed)
Subjective:    Patient ID: Chris Watkins, male    DOB: 26-Jul-1971, 45 y.o.   MRN: [id number]  Chief Complaint  Patient presents with  . Knee Pain    fell out of the bed and landed on left knee, it has really been hurting since, ultrasound and cortisone shot?    HPI:  Chris Watkins is a 45 y.o. male who  has a past medical history of Chicken pox; Diabetes mellitus without complication ([place]); and Hypertension. and presents today for an office visit.   1.) Left knee pain - This is a new problem. Associated symptom of pain located on the medial aspect of his left knee has been going on for approximately 6 days following a fall out of bed and landing on his knee. Describes the pain as sharp on occasion and is aggravated by standing for long periods of time. Modifying factors include ibuprofen. Denies any sounds/sensations or catching or locking feelings. Previously noted to have mild inflammation of the left knee secondary to possible weight and overuse.   No Known Allergies    Outpatient Medications Prior to Visit  Medication Sig Dispense Refill  . amLODipine (NORVASC) 10 MG tablet Take 1 tablet (10 mg total) by mouth daily. 90 tablet 2  . blood glucose meter kit and supplies Dispense based on patient and insurance preference. Check fasting blood sugar daily. Diagnosis: Diabetes E11.9 1 each 0  . fluconazole (DIFLUCAN) 150 MG tablet Take 1 tablet (150 mg total) by mouth once a week. 3 tablet 1  . FREESTYLE LITE test strip TEST BLOOD SUGAR DAILY 100 each 0  . glimepiride (AMARYL) 4 MG tablet Take 1 tablet (4 mg total) by mouth daily with breakfast. 90 tablet 0  . glucose monitoring kit (FREESTYLE) monitoring kit Check fasting BS daily   Dispense 100 lancets and test strips 1 each 0  . hydrochlorothiazide (HYDRODIURIL) 25 MG tablet Take 25 mg by mouth daily.    . Lancets (FREESTYLE) lancets CHECK BS DAILY 100 each 0  . metFORMIN (GLUCOPHAGE-XR) 500 MG 24 hr tablet TAKE 1 TABLET (500 MG  TOTAL) BY MOUTH DAILY WITH BREAKFAST. 90 tablet 2  . quinapril (ACCUPRIL) 40 MG tablet Take 1 tablet (40 mg total) by mouth at bedtime. 90 tablet 2  . clotrimazole-betamethasone (LOTRISONE) cream APPLY 1 APPLICATION TOPICALLY 2 (TWO) TIMES DAILY. 30 g 0  . Diclofenac Sodium (PENNSAID) 2 % SOLN Place 1 application onto the skin 2 (two) times daily as needed. 112 g 1  . Ibuprofen-Famotidine 800-26.6 MG TABS Take 1 tablet by mouth 3 (three) times daily as needed. 90 tablet 1   No facility-administered medications prior to visit.      Past Surgical History:  Procedure Laterality Date  . HERNIA REPAIR    . SLIPPED CAPITAL FEMORAL EPIPHYSIS PINNING       Past Medical History:  Diagnosis Date  . Chicken pox   . Diabetes mellitus without complication ([place])   . Hypertension     Review of Systems  Constitutional: Negative for chills and fever.  Musculoskeletal:       Positive for knee pain.  Neurological: Negative for weakness and numbness.      Objective:    BP 126/86 (BP Location: Left Arm, Patient Position: Sitting, Cuff Size: Normal)   Pulse 98   Temp 98 F (36.7 C) (Oral)   Resp 16   Ht 5' 11" (1.803 m)   Wt 299 lb (135.6 kg)   SpO2  97%   BMI 41.70 kg/m  Nursing note and vital signs reviewed.  Physical Exam  Constitutional: He is oriented to person, place, and time. He appears well-developed and well-nourished. No distress.  Cardiovascular: Normal rate, regular rhythm, normal heart sounds and intact distal pulses.   Pulmonary/Chest: Effort normal and breath sounds normal.  Musculoskeletal:  Left knee - mild edema with no obvious deformity or discoloration. Tenderness elicited over medial joint line and medial collateral ligament. No crepitus or deformity appreciated. Range of motion within normal limits. Strength is normal. Positive valgus stress test for pain with no significant laxity. All other ligamentous and meniscal testing are negative.  Neurological: He is alert  and oriented to person, place, and time.  Skin: Skin is warm and dry.  Psychiatric: He has a normal mood and affect. His behavior is normal. Judgment and thought content normal.       Assessment & Plan:   Problem List Items Addressed This Visit      Musculoskeletal and Integument   Knee MCL sprain    Symptoms and exam consistent with mild to moderate MCL sprain with no significant laxity. Treat conservatively with ice, Duexsis and Pennsaid, and home exercise therapy. Knee brace as needed. Follow up if symptoms do not improve.       Relevant Medications   Ibuprofen-Famotidine 800-26.6 MG TABS   Diclofenac Sodium (PENNSAID) 2 % SOLN    Other Visit Diagnoses   None.     I am having Chris Watkins maintain his glucose monitoring kit, blood glucose meter kit and supplies, freestyle, FREESTYLE LITE, amLODipine, quinapril, glimepiride, hydrochlorothiazide, fluconazole, metFORMIN, Ibuprofen-Famotidine, Diclofenac Sodium, and clotrimazole-betamethasone.   Meds ordered this encounter  Medications  . DISCONTD: Diclofenac Sodium (PENNSAID) 2 % SOLN    Sig: Place 1 application onto the skin 2 (two) times daily as needed.    Dispense:  112 g    Refill:  1  . Ibuprofen-Famotidine 800-26.6 MG TABS    Sig: Take 1 tablet by mouth 3 (three) times daily as needed.    Dispense:  90 tablet    Refill:  1    Order Specific Question:   Supervising Provider    Answer:   Pricilla Holm A [[id number]]  . Diclofenac Sodium (PENNSAID) 2 % SOLN    Sig: Place 1 application onto the skin 2 (two) times daily as needed.    Dispense:  112 g    Refill:  1    Order Specific Question:   Supervising Provider    Answer:   Pricilla Holm A [[id number]]  . clotrimazole-betamethasone (LOTRISONE) cream    Sig: APPLY 1 APPLICATION TOPICALLY 2 (TWO) TIMES DAILY.    Dispense:  30 g    Refill:  0    Order Specific Question:   Supervising Provider    Answer:   Pricilla Holm A [[id number]]     Follow-up: Return in  about 3 weeks (around 09/10/2016), or if symptoms worsen or fail to improve.  Chris Po, FNP

## 2016-10-09 ENCOUNTER — Other Ambulatory Visit: Payer: Self-pay | Admitting: Family

## 2016-10-11 ENCOUNTER — Other Ambulatory Visit: Payer: Self-pay | Admitting: Family

## 2016-11-17 ENCOUNTER — Other Ambulatory Visit (INDEPENDENT_AMBULATORY_CARE_PROVIDER_SITE_OTHER): Payer: BLUE CROSS/BLUE SHIELD

## 2016-11-17 ENCOUNTER — Encounter: Payer: Self-pay | Admitting: Family

## 2016-11-17 ENCOUNTER — Ambulatory Visit (INDEPENDENT_AMBULATORY_CARE_PROVIDER_SITE_OTHER): Payer: BLUE CROSS/BLUE SHIELD | Admitting: Family

## 2016-11-17 VITALS — BP 128/92 | HR 91 | Temp 97.8°F | Resp 16 | Ht 71.0 in | Wt 293.0 lb

## 2016-11-17 DIAGNOSIS — E119 Type 2 diabetes mellitus without complications: Secondary | ICD-10-CM

## 2016-11-17 LAB — HEMOGLOBIN A1C: HEMOGLOBIN A1C: 7.1 % — AB (ref 4.6–6.5)

## 2016-11-17 MED ORDER — IBUPROFEN-FAMOTIDINE 800-26.6 MG PO TABS
1.0000 | ORAL_TABLET | Freq: Three times a day (TID) | ORAL | 1 refills | Status: DC | PRN
Start: 2016-11-17 — End: 2018-01-18

## 2016-11-17 NOTE — Assessment & Plan Note (Signed)
Diabetes appears adequately controlled with current regimen based on home blood sugars of 95-108. No adverse side effects or hypoglycemic readings. No new symptoms of end organ damage noted on physical exam. Maintained on quinapril for CAD risk reduction. Diabetic foot exam completed today. Due for diabetic eye exam with referral to ophthalmology placed. Obtain A1c and microalbumin. Continue current dosage of metformin and, provide pending A1c results. Continue to monitor blood sugars 1-2 times per day.

## 2016-11-17 NOTE — Patient Instructions (Addendum)
Thank you for choosing [facility].  SUMMARY AND INSTRUCTIONS:  Medication:  Please continue to take your medications as prescribed.   Your prescription(s) have been submitted to your pharmacy or been printed and provided for you. Please take as directed and contact our office if you believe you are having problem(s) with the medication(s) or have any questions.  Labs:  Please stop by the lab on the lower level of the building for your blood work. Your results will be released to [facility] (or called to you) after review, usually within 72 hours after test completion. If any changes need to be made, you will be notified at that same time.  1.) The lab is open from 7:30am to 5:30 pm Monday-Friday 2.) No appointment is necessary 3.) Fasting (if needed) is 6-8 hours after food and drink; black coffee and water are okay   Referrals:  Referrals have been made during this visit. You should expect to hear back from our schedulers in about 7-10 days in regards to establishing an appointment with the specialists we discussed.   Follow up:  If your symptoms worsen or fail to improve, please contact our office for further instruction, or in case of emergency go directly to the emergency room at the closest medical facility.

## 2016-11-17 NOTE — Progress Notes (Signed)
Subjective:    Patient ID: Chris Watkins, male    DOB: Jun 27, 1971, 45 y.o.   MRN: [id number]  Chief Complaint  Patient presents with  . Follow-up    diabetic follow up    HPI:  Chris Watkins is a 45 y.o. male who  has a past medical history of Chicken pox; Diabetes mellitus without complication ([place]); and Hypertension. and presents today for a follow up office visit.  1.) Diabetes - Currently maintained on Metformin and glimepiride and reports taking the medication as prescribed and denies adverse side effects or hypoglycemic readings. Blood sugars at home ranging from 95-108 . Denies new symptoms of end organ damage or changes in vision.   Lab Results  Component Value Date   HGBA1C 7.6 (H) 04/29/2016    Lab Results  Component Value Date   CREATININE 0.76 04/29/2016   BUN 14 04/29/2016   NA 136 04/29/2016   K 4.0 04/29/2016   CL 99 04/29/2016   CO2 28 04/29/2016    No Known Allergies    Outpatient Medications Prior to Visit  Medication Sig Dispense Refill  . amLODipine (NORVASC) 10 MG tablet Take 1 tablet (10 mg total) by mouth daily. 90 tablet 2  . blood glucose meter kit and supplies Dispense based on patient and insurance preference. Check fasting blood sugar daily. Diagnosis: Diabetes E11.9 1 each 0  . clotrimazole-betamethasone (LOTRISONE) cream APPLY 1 APPLICATION TOPICALLY 2 (TWO) TIMES DAILY. 30 g 0  . Diclofenac Sodium (PENNSAID) 2 % SOLN Place 1 application onto the skin 2 (two) times daily as needed. 112 g 1  . fluconazole (DIFLUCAN) 150 MG tablet Take 1 tablet (150 mg total) by mouth once a week. 3 tablet 1  . FREESTYLE LITE test strip TEST BLOOD SUGAR DAILY 100 each 0  . glimepiride (AMARYL) 4 MG tablet Take 1 tablet (4 mg total) by mouth daily with breakfast. 90 tablet 0  . glimepiride (AMARYL) 4 MG tablet TAKE 2 TABLETS BY MOUTH EVERY DAY WITH BREAKFAST. 180 tablet 2  . glucose monitoring kit (FREESTYLE) monitoring kit Check fasting BS daily   Dispense  100 lancets and test strips 1 each 0  . hydrochlorothiazide (HYDRODIURIL) 25 MG tablet Take 25 mg by mouth daily.    . Lancets (FREESTYLE) lancets CHECK BS DAILY 100 each 0  . metFORMIN (GLUCOPHAGE-XR) 500 MG 24 hr tablet TAKE 1 TABLET (500 MG TOTAL) BY MOUTH DAILY WITH BREAKFAST. 90 tablet 2  . quinapril (ACCUPRIL) 40 MG tablet Take 1 tablet (40 mg total) by mouth at bedtime. 90 tablet 2  . Ibuprofen-Famotidine 800-26.6 MG TABS Take 1 tablet by mouth 3 (three) times daily as needed. 90 tablet 1   No facility-administered medications prior to visit.      Review of Systems  Eyes:       Negative for changes in vision.  Respiratory: Negative for chest tightness and shortness of breath.   Cardiovascular: Negative for chest pain, palpitations and leg swelling.  Endocrine: Negative for polydipsia, polyphagia and polyuria.  Neurological: Negative for dizziness, weakness, light-headedness and headaches.      Objective:    BP (!) 128/92 (BP Location: Left Arm, Patient Position: Sitting, Cuff Size: Large)   Pulse 91   Temp 97.8 F (36.6 C) (Oral)   Resp 16   Ht 5' 11"  (1.803 m)   Wt 293 lb (132.9 kg)   SpO2 97%   BMI 40.87 kg/m  Nursing note and vital signs reviewed.  Physical Exam  Constitutional: He is oriented to person, place, and time. He appears well-developed and well-nourished. No distress.  Cardiovascular: Normal rate, regular rhythm, normal heart sounds and intact distal pulses.   Pulmonary/Chest: Effort normal and breath sounds normal.  Neurological: He is alert and oriented to person, place, and time.  Diabetic Foot Exam - Simple   Simple Foot Form Diabetic Foot exam was performed with the following findings:  Yes  11/17/2016 10:25 AM  Visual Inspection No deformities, no ulcerations, no other skin breakdown bilaterally:  Yes Sensation Testing Intact to touch and monofilament testing bilaterally:  Yes Pulse Check Posterior Tibialis and Dorsalis pulse intact  bilaterally:  Yes Comments  Skin: Skin is warm and dry.  Psychiatric: He has a normal mood and affect. His behavior is normal. Judgment and thought content normal.       Assessment & Plan:   Problem List Items Addressed This Visit      Endocrine   Type 2 diabetes mellitus ([place]) - Primary    Diabetes appears adequately controlled with current regimen based on home blood sugars of 95-108. No adverse side effects or hypoglycemic readings. No new symptoms of end organ damage noted on physical exam. Maintained on quinapril for CAD risk reduction. Diabetic foot exam completed today. Due for diabetic eye exam with referral to ophthalmology placed. Obtain A1c and microalbumin. Continue current dosage of metformin and, provide pending A1c results. Continue to monitor blood sugars 1-2 times per day.      Relevant Orders   Ambulatory referral to Ophthalmology   Hemoglobin A1c   Urine Microalbumin w/creat. ratio       I am having Mr. Broecker maintain his glucose monitoring kit, blood glucose meter kit and supplies, freestyle, FREESTYLE LITE, amLODipine, quinapril, glimepiride, hydrochlorothiazide, fluconazole, metFORMIN, Diclofenac Sodium, glimepiride, clotrimazole-betamethasone, and Ibuprofen-Famotidine.   Meds ordered this encounter  Medications  . Ibuprofen-Famotidine 800-26.6 MG TABS    Sig: Take 1 tablet by mouth 3 (three) times daily as needed.    Dispense:  90 tablet    Refill:  1    Order Specific Question:   Supervising Provider    Answer:   Pricilla Holm A [[id number]]     Follow-up: Return if symptoms worsen or fail to improve.  Chris Po, FNP

## 2016-11-19 LAB — MICROALBUMIN / CREATININE URINE RATIO
Creatinine,U: 89.6 mg/dL
Microalb Creat Ratio: 0.8 mg/g (ref 0.0–30.0)
Microalb, Ur: <0.7 mg/dL (ref 0.0–1.9)

## 2016-12-22 ENCOUNTER — Other Ambulatory Visit: Payer: Self-pay | Admitting: Family

## 2017-01-21 DIAGNOSIS — E119 Type 2 diabetes mellitus without complications: Secondary | ICD-10-CM | POA: Diagnosis not present

## 2017-01-21 DIAGNOSIS — H5212 Myopia, left eye: Secondary | ICD-10-CM | POA: Diagnosis not present

## 2017-01-21 LAB — HM DIABETES EYE EXAM

## 2017-01-24 ENCOUNTER — Encounter: Payer: Self-pay | Admitting: Family

## 2017-02-08 ENCOUNTER — Telehealth: Payer: Self-pay | Admitting: Family

## 2017-02-08 NOTE — Telephone Encounter (Signed)
The week before last the sugars were running in the 300s went down to 200s last week and yesterday it went back up to 329. Made an appointment for Friday. Pt wants to know what to do until then. He is only taking 1 metformin and 1 glimepiride daily.

## 2017-02-08 NOTE — Telephone Encounter (Signed)
Pt said that his sugar is still running high,  He needs a nurse to call him today to figure out what he needs to do to get it down

## 2017-02-08 NOTE — Telephone Encounter (Signed)
Please advise 

## 2017-02-08 NOTE — Telephone Encounter (Signed)
Pt aware.

## 2017-02-08 NOTE — Telephone Encounter (Signed)
Increase metformin to 1000 mg daily either 2 tablets by mouth in the morning or 1 tablet by mouth twice daily.

## 2017-02-11 ENCOUNTER — Encounter: Payer: Self-pay | Admitting: Family

## 2017-02-11 ENCOUNTER — Ambulatory Visit (INDEPENDENT_AMBULATORY_CARE_PROVIDER_SITE_OTHER): Payer: BLUE CROSS/BLUE SHIELD | Admitting: Family

## 2017-02-11 VITALS — BP 138/74 | HR 98 | Temp 98.0°F | Resp 16 | Ht 71.0 in | Wt 288.8 lb

## 2017-02-11 DIAGNOSIS — E119 Type 2 diabetes mellitus without complications: Secondary | ICD-10-CM | POA: Diagnosis not present

## 2017-02-11 NOTE — Progress Notes (Signed)
Subjective:    Patient ID: Chris Watkins, male    DOB: 16-Mar-1971, 46 y.o.   MRN: [id number]  Chief Complaint  Patient presents with  . Diabetes check    had to double up on metformin due to bs being high ranging from 250-280    HPI:  Chris Watkins is a 46 y.o. male who  has a past medical history of Chicken pox; Diabetes mellitus without complication ([place]); and Hypertension. and presents todayFor a follow-up office visit.  Type 2 diabetes - currently maintained on metformin and glimepiride. Reports taking medication as prescribed with no adverse side effects. Notes his blood sugars have been elevated over the past 3 weeks and ranging from 250-280. Nutritionally his intake has not been very good. Blood sugars remain elevated with the addition of 500 mg of metformin. Starting to get back in the gym. Has excessive urination and thirst. No changes in vision.   Lab Results  Component Value Date   HGBA1C 7.1 (H) 11/17/2016     No Known Allergies    Outpatient Medications Prior to Visit  Medication Sig Dispense Refill  . amLODipine (NORVASC) 10 MG tablet Take 1 tablet (10 mg total) by mouth daily. 90 tablet 2  . blood glucose meter kit and supplies Dispense based on patient and insurance preference. Check fasting blood sugar daily. Diagnosis: Diabetes E11.9 1 each 0  . clotrimazole-betamethasone (LOTRISONE) cream APPLY 1 APPLICATION TOPICALLY 2 (TWO) TIMES DAILY. 30 g 0  . Diclofenac Sodium (PENNSAID) 2 % SOLN Place 1 application onto the skin 2 (two) times daily as needed. 112 g 1  . fluconazole (DIFLUCAN) 150 MG tablet Take 1 tablet (150 mg total) by mouth once a week. 3 tablet 1  . FREESTYLE LITE test strip TEST BLOOD SUGAR DAILY 100 each 0  . glimepiride (AMARYL) 4 MG tablet TAKE 2 TABLETS BY MOUTH EVERY DAY WITH BREAKFAST. 180 tablet 2  . glucose monitoring kit (FREESTYLE) monitoring kit Check fasting BS daily   Dispense 100 lancets and test strips 1 each 0  .  hydrochlorothiazide (HYDRODIURIL) 25 MG tablet Take 25 mg by mouth daily.    . Ibuprofen-Famotidine 800-26.6 MG TABS Take 1 tablet by mouth 3 (three) times daily as needed. 90 tablet 1  . Lancets (FREESTYLE) lancets CHECK BS DAILY 100 each 0  . metFORMIN (GLUCOPHAGE-XR) 500 MG 24 hr tablet TAKE 1 TABLET (500 MG TOTAL) BY MOUTH DAILY WITH BREAKFAST. 90 tablet 2  . quinapril (ACCUPRIL) 40 MG tablet Take 1 tablet (40 mg total) by mouth at bedtime. 90 tablet 2  . glimepiride (AMARYL) 4 MG tablet Take 1 tablet (4 mg total) by mouth daily with breakfast. 90 tablet 0   No facility-administered medications prior to visit.      Review of Systems  Eyes:       Negative for changes in vision.  Respiratory: Negative for chest tightness and shortness of breath.   Cardiovascular: Negative for chest pain, palpitations and leg swelling.  Endocrine: Negative for polydipsia, polyphagia and polyuria.  Neurological: Negative for dizziness, weakness, light-headedness and headaches.      Objective:    BP 138/74 (BP Location: Left Arm, Patient Position: Sitting, Cuff Size: Large)   Pulse 98   Temp 98 F (36.7 C) (Oral)   Resp 16   Ht _0  (1.803 m)   Wt 288 lb 12.8 oz (131 kg)   SpO2 96%   BMI 40.28 kg/m  Nursing note and vital signs  reviewed.  Physical Exam  Constitutional: He is oriented to person, place, and time. He appears well-developed and well-nourished. No distress.  Cardiovascular: Normal rate, regular rhythm, normal heart sounds and intact distal pulses.   Pulmonary/Chest: Effort normal and breath sounds normal.  Neurological: He is alert and oriented to person, place, and time.  Skin: Skin is warm and dry.  Psychiatric: He has a normal mood and affect. His behavior is normal. Judgment and thought content normal.       Assessment & Plan:   Problem List Items Addressed This Visit      Endocrine   Type 2 diabetes mellitus ([place]) - Primary    Type 2 diabetes with worsening control  of blood sugar secondary to poor nutrition. Continue metformin and glimepiride. Start [place]. Will check on price of SGLT-2's. Continue to monitor blood sugars at home. Follow up with blood sugar readings and cost of medication. Continue to monitor.           I am having Mr. Killings maintain his glucose monitoring kit, blood glucose meter kit and supplies, freestyle, FREESTYLE LITE, amLODipine, quinapril, hydrochlorothiazide, fluconazole, metFORMIN, Diclofenac Sodium, glimepiride, Ibuprofen-Famotidine, and clotrimazole-betamethasone.   Follow-up: Return in about 1 month (around 03/11/2017), or if symptoms worsen or fail to improve.  Mauricio Po, FNP

## 2017-02-11 NOTE — Assessment & Plan Note (Signed)
Type 2 diabetes with worsening control of blood sugar secondary to poor nutrition. Continue metformin and glimepiride. Start [place]. Will check on price of SGLT-2's. Continue to monitor blood sugars at home. Follow up with blood sugar readings and cost of medication. Continue to monitor.

## 2017-02-11 NOTE — Patient Instructions (Signed)
Thank you for choosing [facility].  SUMMARY AND INSTRUCTIONS:  Please continue to take your metformin 2 tablets daily.  Start Farxiga 1 tablet by mouth daily.  Check on the price of Farxiga, Jardiance, and Invokana.   Medication:  Your prescription(s) have been submitted to your pharmacy or been printed and provided for you. Please take as directed and contact our office if you believe you are having problem(s) with the medication(s) or have any questions.  Follow up:  If your symptoms worsen or fail to improve, please contact our office for further instruction, or in case of emergency go directly to the emergency room at the closest medical facility.

## 2017-02-21 ENCOUNTER — Other Ambulatory Visit: Payer: Self-pay | Admitting: *Deleted

## 2017-02-21 ENCOUNTER — Telehealth: Payer: Self-pay | Admitting: Family

## 2017-02-21 MED ORDER — METFORMIN HCL ER 500 MG PO TB24
ORAL_TABLET | ORAL | 1 refills | Status: DC
Start: 2017-02-21 — End: 2017-02-21

## 2017-02-21 MED ORDER — METFORMIN HCL ER 500 MG PO TB24: 1000.0000 mg | ORAL_TABLET | Freq: Every day | ORAL | 0 refills | Status: DC

## 2017-02-21 MED ORDER — QUINAPRIL HCL 40 MG PO TABS: 40.0000 mg | ORAL_TABLET | Freq: Every day | ORAL | 1 refills | Status: DC

## 2017-02-21 NOTE — Telephone Encounter (Signed)
New prescription sent

## 2017-02-21 NOTE — Telephone Encounter (Signed)
Pt called stated Metformin that send in today he can not get it fill until March. Pt stated Chris Watkins up his dosage to 100 mg a day since 02/08/2017 but the pharmacy still thinking he is taking 1 daily. Please check and resend with the correct dosage for pt.

## 2017-02-21 NOTE — Telephone Encounter (Signed)
[facility]ills [facility]...Chris Watkins

## 2017-02-22 NOTE — Telephone Encounter (Signed)
LVM letting pt know.  

## 2017-02-24 NOTE — Telephone Encounter (Addendum)
Patient states that farxiga is less expensive for him.  Requesting script to be sent to pharmacy today if possible b/c he has already used last sample.  [facility]  Does not know location.  Also needs refill on hydrochlorothiazide.

## 2017-02-24 NOTE — Telephone Encounter (Signed)
Please advise on strength of farxiga and advise on refill for hctz since you have never filled before.

## 2017-02-28 MED ORDER — DAPAGLIFLOZIN PROPANEDIOL 5 MG PO TABS: 5.0000 mg | ORAL_TABLET | Freq: Every day | ORAL | 2 refills | Status: DC

## 2017-02-28 MED ORDER — HYDROCHLOROTHIAZIDE 25 MG PO TABS: 25.0000 mg | ORAL_TABLET | Freq: Every day | ORAL | 0 refills | Status: DC

## 2017-02-28 NOTE — Addendum Note (Signed)
Addended by: Mauricio Po D on: 02/28/2017 12:26 PM   Modules accepted: Orders

## 2017-02-28 NOTE — Telephone Encounter (Signed)
Medications have been sent to pharmacy 

## 2017-03-01 ENCOUNTER — Other Ambulatory Visit: Payer: Self-pay | Admitting: *Deleted

## 2017-03-01 MED ORDER — FREESTYLE SYSTEM KIT: PACK | 0 refills | Status: DC

## 2017-03-01 MED ORDER — GLUCOSE BLOOD VI STRP: ORAL_STRIP | 3 refills | Status: DC

## 2017-03-01 MED ORDER — FREESTYLE LANCETS MISC: 3 refills | Status: DC

## 2017-03-01 NOTE — Telephone Encounter (Signed)
Notified pt medications [facility].../[facility]

## 2017-05-27 ENCOUNTER — Other Ambulatory Visit: Payer: Self-pay | Admitting: Family

## 2017-05-30 ENCOUNTER — Other Ambulatory Visit: Payer: Self-pay | Admitting: Family

## 2017-06-20 ENCOUNTER — Encounter: Payer: Self-pay | Admitting: Family Medicine

## 2017-06-20 ENCOUNTER — Ambulatory Visit (INDEPENDENT_AMBULATORY_CARE_PROVIDER_SITE_OTHER): Payer: BLUE CROSS/BLUE SHIELD | Admitting: Family Medicine

## 2017-06-20 ENCOUNTER — Ambulatory Visit (INDEPENDENT_AMBULATORY_CARE_PROVIDER_SITE_OTHER)
Admission: RE | Admit: 2017-06-20 | Discharge: 2017-06-20 | Disposition: A | Discharge status: Home / Self Care | Payer: BLUE CROSS/BLUE SHIELD | Source: Ambulatory Visit | Attending: Family Medicine | Admitting: Family Medicine

## 2017-06-20 VITALS — BP 124/74 | HR 94 | Temp 98.3°F | Resp 14 | Ht 71.0 in | Wt 270.0 lb

## 2017-06-20 DIAGNOSIS — M25552 Pain in left hip: Secondary | ICD-10-CM

## 2017-06-20 NOTE — Patient Instructions (Signed)
We will notify you of your xray results today  You can take 2 Alleve twice a day for the next 4 days, can also try heat/ice

## 2017-06-20 NOTE — Progress Notes (Signed)
   Subjective:    Patient ID: Chris Watkins, male    DOB: 1971/06/16, 46 y.o.   MRN: [id number]  HPI This is a 46 yo male who presents today with left hip pain x 4 days, noticed upon awakening. No recent falls. Recently changed his job role and is walking more, doing more cranking, lifting last week. Pain has gotten worse over the last 4 days. Took some Alleve, total of 3 yesterday and took 2 this morning with some improvement. No numbness or tingling, no low back pain.  As child, had bilateral femoral epiphysis pinning.   Past Medical History:  Diagnosis Date  . Chicken pox   . Diabetes mellitus without complication ([place])   . Hypertension    Past Surgical History:  Procedure Laterality Date  . HERNIA REPAIR    . SLIPPED CAPITAL FEMORAL EPIPHYSIS PINNING     Family History  Problem Relation Age of Onset  . Thyroid disease Mother   . Hypertension Father   . Diabetes Father   . Diabetes Maternal Grandmother    Social History  Substance Use Topics  . Smoking status: Current Every Day Smoker    Packs/day: 0.50    Types: Cigarettes  . Smokeless tobacco: Never Used  . Alcohol use Yes     Comment: Has not since he was diagnosed with DM in 01/2015      Review of Systems Per HPI      Objective:   Physical Exam  Constitutional: He is oriented to person, place, and time. He appears well-developed and well-nourished. No distress.  Morbidly obese.   HENT:  Head: Normocephalic and atraumatic.  Eyes: Conjunctivae are normal.  Cardiovascular: Normal rate.   Pulmonary/Chest: Effort normal.  Musculoskeletal:       Left hip: He exhibits decreased range of motion, tenderness and bony tenderness.  Tender over left lateral hip and left buttock. Pain with hip flexion, very tight with internal and external rotation.   Neurological: He is alert and oriented to person, place, and time.  Skin: Skin is warm and dry. He is not diaphoretic.  Psychiatric: He has a normal mood and affect. His  behavior is normal. Judgment and thought content normal.  Vitals reviewed.        BP 124/74 (BP Location: Left Arm, Patient Position: Sitting, Cuff Size: Large)   Pulse 94   Temp 98.3 F (36.8 C) (Oral)   Resp 14   Ht 5\' 11"  (1.803 m)   Wt 270 lb (122.5 kg)   SpO2 96%   BMI 37.66 kg/m  Wt Readings from Last 3 Encounters:  06/20/17 270 lb (122.5 kg)  02/11/17 288 lb 12.8 oz (131 kg)  11/17/16 293 lb (132.9 kg)    Assessment & Plan:  1. Acute hip pain, left - given acute onset and abnormal exam, will get xray, if normal will send to Dr. Tamala Julian (sports med), if abnormal and needed, will send to orthopedic surgery.  - can take Alleve 2 tablets BID for 4 days, heat/ice, gentle ROM - DG HIP UNILAT W OR W/O PELVIS 2-3 VIEWS LEFT; Future   Clarene Reamer, FNP-BC  [facility]  06/20/2017 10:35 AM

## 2017-06-21 ENCOUNTER — Ambulatory Visit: Payer: Self-pay

## 2017-06-21 ENCOUNTER — Ambulatory Visit (INDEPENDENT_AMBULATORY_CARE_PROVIDER_SITE_OTHER): Payer: BLUE CROSS/BLUE SHIELD | Admitting: Sports Medicine

## 2017-06-21 ENCOUNTER — Encounter: Payer: Self-pay | Admitting: Sports Medicine

## 2017-06-21 VITALS — BP 122/82 | HR 84 | Ht 71.0 in | Wt 273.0 lb

## 2017-06-21 DIAGNOSIS — M25552 Pain in left hip: Secondary | ICD-10-CM | POA: Diagnosis not present

## 2017-06-21 NOTE — Patient Instructions (Signed)

## 2017-06-21 NOTE — Assessment & Plan Note (Signed)
Hip abduction strengthening exercises reviewed with the patient today as well as IT band stretching.  We will plan to follow-up with him in 6 weeks.  If any lack of improvement over the next several weeks can consider referral to physical therapy.  PROCEDURE NOTE -  ULTRASOUND GUIDEDINJECTION: Left Greater trochanteric injection Images were obtained and interpreted by myself, Teresa Coombs, DO  Images have been saved and stored to PACS system. Images obtained on: GE S7 Ultrasound machine  ULTRASOUND FINDINGS: Surgical pin sites intact without significant hypoechoic fluid.  There is small bursal formation deep to the peritendinous calcification.  This was markedly painful with the needle was inserted directly into this area was located just superior to the upper tip of the greater trochanter.  DESCRIPTION OF PROCEDURE:  The patient's clinical condition is marked by substantial pain and/or significant functional disability. Other conservative therapy has not provided relief, is contraindicated, or not appropriate. There is a reasonable likelihood that injection will significantly improve the patient's pain and/or functional impairment. After discussing the risks, benefits and expected outcomes of the injection and all questions were reviewed and answered, the patient wished to undergo the above named procedure. Verbal consent was obtained. The ultrasound was used to identify the target structure and adjacent neurovascular structures. The skin was then prepped in sterile fashion and the target structure was injected under direct visualization using sterile technique as below: PREP: Alcohol, Ethel Chloride APPROACH: Anterior,single injections, 22g 3.5" needle INJECTATE: 2cc 1% lidocaine, 2cc 40mg  DepoMedrol ASPIRATE: N/A DRESSING: Band-Aid  Post procedural instructions including recommending icing and warning signs for infection were reviewed. This procedure was well tolerated and there were no  complications.   IMPRESSION: Succesful US Guided Injection

## 2017-06-21 NOTE — Progress Notes (Signed)
OFFICE VISIT NOTE Chris Watkins. Chris Watkins, [facility] [place]  Chris Watkins - 46 y.o. male MRN [id number]  Date of birth: [birth year]  Visit Date: 06/21/2017  PCP: Golden Circle, FNP   Referred by: Golden Circle, FNP   Burlene Arnt, CMA acting as scribe for Dr. Paulla Fore.  SUBJECTIVE:   Chief Complaint  Patient presents with  . left hip pain   HPI: As below and per problem based documentation when appropriate.  Pt presents today with complaint of pain in the left hip. Pt had xray done 06/20/17 Pain started about 5 days ago after waking.  No known injury or trauma, he did have femoral epiphysis pinning as a child.   The pain is described as aching that comes and goes and is rated as 6/10 at its worst.  Worsened with getting in and out of the car or bed. He has trouble lifting the leg when in a seated position. He has pain when going up and down stairs but pain is worse when going up. He has pain when bearing weight. He has pain when he rolls onto his left side in bed.  Improves with rest. Therapies tried include : Aleve provided mild improvement.   Other associated symptoms include: Pt denies groin pain. He has pain in the left gluteal region and left hamstring when sitting. Pt denies swelling or bruising at the hip  Pt denies fever, chills, night sweats    Review of Systems  Constitutional: Negative for chills and fever.  Respiratory: Negative for shortness of breath and wheezing.   Cardiovascular: Positive for leg swelling (bilateral knees, comes and goes). Negative for chest pain and palpitations.  Musculoskeletal: Negative for falls.  Neurological: Negative for dizziness, tingling and headaches.  Endo/Heme/Allergies: Does not bruise/bleed easily.    Otherwise per HPI.  HISTORY & PERTINENT PRIOR DATA:  No specialty comments available. He reports that he has been smoking Cigarettes.  He has been smoking about  0.50 packs per day. He has never used smokeless tobacco.   Recent Labs  11/17/16 1027  HGBA1C 7.1*   Medications & Allergies reviewed per EMR Patient Active Problem List   Diagnosis Date Noted  . Left hip pain 06/21/2017  . Knee MCL sprain 06/23/2016  . Tinea pedis 04/29/2016  . Right knee pain 10/20/2015  . Type 2 diabetes mellitus ([place]) 04/23/2015  . Routine general medical examination at a health care facility 04/23/2015  . HTN (hypertension) 04/09/2014   Past Medical History:  Diagnosis Date  . Chicken pox   . Diabetes mellitus without complication ([place])   . Hypertension    Family History  Problem Relation Age of Onset  . Thyroid disease Mother   . Hypertension Father   . Diabetes Father   . Diabetes Maternal Grandmother    Past Surgical History:  Procedure Laterality Date  . HERNIA REPAIR    . SLIPPED CAPITAL FEMORAL EPIPHYSIS PINNING     Social History   Occupational History  . Lamination operator    Social History Main Topics  . Smoking status: Current Every Day Smoker    Packs/day: 0.50    Types: Cigarettes  . Smokeless tobacco: Never Used  . Alcohol use Yes     Comment: Has not since he was diagnosed with DM in 01/2015  . Drug use: No  . Sexual activity: Not on file    OBJECTIVE:  VS:  HT:5\' 11"  (180.3 cm)  WT:273 lb (123.8 kg)  BMI:38.2    BP:122/82  HR:84bpm  TEMP: ( )  RESP:94 % EXAM: Findings:  WDWN, NAD, Non-toxic appearing Alert & appropriately interactive Not depressed or anxious appearing No increased work of breathing. Pupils are equal. EOM intact without nystagmus No clubbing or cyanosis of the extremities appreciated No significant rashes/lesions/ulcerations overlying the examined area. DP & PT pulses 2+/4.  No significant pretibial edema. Sensation intact to light touch in lower extremities.  Left hip and back negative straight leg test bilaterally.  He has good internal/external rotation of bilateral hips with small amount  of pain patient on the left.  He is markedly weak hip abduction strength due to pain.  He is focal TTP over the superior portion of the left greater trochanter.  No pain over the incision from his well-healed percutaneous pinning site.  No significant pain with Stinchfield testing.  Independent only reviewed x-rays obtained yesterday that does show a tenderness ossification consistent with likely calcific tendinitis left greater than right.  This is the area in which he is most focally tender.     Dg Hip Unilat W Or W/o Pelvis 2-3 Views Left  Result Date: 06/20/2017 CLINICAL DATA:  Acute left hip pain for 3 days. EXAM: DG HIP (WITH OR WITHOUT PELVIS) 2-3V LEFT COMPARISON:  No prior . FINDINGS: Tendinous ossification noted over both hips. Surgical screws noted both hips. R where intact . No acute bony abnormalities identified. IMPRESSION: Postsurgical changes both hips. Hardware intact. Anatomic alignment. No acute bony abnormality identified. Electronically Signed   By: Marcello Moores  Register   On: 06/20/2017 11:56   ASSESSMENT & PLAN:   Problem List Items Addressed This Visit    Left hip pain - Primary    Hip abduction strengthening exercises reviewed with the patient today as well as IT band stretching.  We will plan to follow-up with him in 6 weeks.  If any lack of improvement over the next several weeks can consider referral to physical therapy.  PROCEDURE NOTE -  ULTRASOUND GUIDEDINJECTION: Left Greater trochanteric injection Images were obtained and interpreted by myself, Teresa Coombs, DO  Images have been saved and stored to PACS system. Images obtained on: GE S7 Ultrasound machine  ULTRASOUND FINDINGS: Surgical pin sites intact without significant hypoechoic fluid.  There is small bursal formation deep to the peritendinous calcification.  This was markedly painful with the needle was inserted directly into this area was located just superior to the upper tip of the greater  trochanter.  DESCRIPTION OF PROCEDURE:  The patient's clinical condition is marked by substantial pain and/or significant functional disability. Other conservative therapy has not provided relief, is contraindicated, or not appropriate. There is a reasonable likelihood that injection will significantly improve the patient's pain and/or functional impairment. After discussing the risks, benefits and expected outcomes of the injection and all questions were reviewed and answered, the patient wished to undergo the above named procedure. Verbal consent was obtained. The ultrasound was used to identify the target structure and adjacent neurovascular structures. The skin was then prepped in sterile fashion and the target structure was injected under direct visualization using sterile technique as below: PREP: Alcohol, Ethel Chloride APPROACH: Anterior,single injections, 22g 3.5" needle INJECTATE: 2cc 1% lidocaine, 2cc 40mg  DepoMedrol ASPIRATE: N/A DRESSING: Band-Aid  Post procedural instructions including recommending icing and warning signs for infection were reviewed. This procedure was well tolerated and there were no complications.   IMPRESSION: Succesful US Guided Injection  Relevant Orders   [place] LIMITED JOINT SPACE STRUCTURES LOW LEFT(NO LINKED CHARGES)      Follow-up: Return in about 6 weeks (around 08/02/2017).   CMA/ATC served as Education administrator during this visit. History, Physical, and Plan performed by medical provider. Documentation and orders reviewed and attested to.      Teresa Coombs, [facility] Physician

## 2017-08-26 ENCOUNTER — Other Ambulatory Visit: Payer: Self-pay | Admitting: Family

## 2017-09-06 ENCOUNTER — Other Ambulatory Visit: Payer: Self-pay | Admitting: Family

## 2017-09-07 ENCOUNTER — Other Ambulatory Visit: Payer: Self-pay | Admitting: Family

## 2017-10-07 ENCOUNTER — Other Ambulatory Visit (INDEPENDENT_AMBULATORY_CARE_PROVIDER_SITE_OTHER): Payer: BLUE CROSS/BLUE SHIELD

## 2017-10-07 ENCOUNTER — Encounter: Payer: Self-pay | Admitting: Family

## 2017-10-07 ENCOUNTER — Ambulatory Visit (INDEPENDENT_AMBULATORY_CARE_PROVIDER_SITE_OTHER): Payer: BLUE CROSS/BLUE SHIELD | Admitting: Family

## 2017-10-07 VITALS — BP 128/82 | HR 88 | Temp 97.8°F | Resp 16 | Ht 71.0 in | Wt 265.0 lb

## 2017-10-07 DIAGNOSIS — E119 Type 2 diabetes mellitus without complications: Secondary | ICD-10-CM | POA: Diagnosis not present

## 2017-10-07 DIAGNOSIS — I10 Essential (primary) hypertension: Secondary | ICD-10-CM | POA: Diagnosis not present

## 2017-10-07 LAB — COMPREHENSIVE METABOLIC PANEL
ALBUMIN: 4.2 g/dL (ref 3.5–5.2)
ALT: 31 U/L (ref 0–53)
AST: 22 U/L (ref 0–37)
Alkaline Phosphatase: 78 U/L (ref 39–117)
BUN: 13 mg/dL (ref 6–23)
CALCIUM: 8.6 mg/dL (ref 8.4–10.5)
CHLORIDE: 101 meq/L (ref 96–112)
CO2: 29 mEq/L (ref 19–32)
Creatinine, Ser: 0.66 mg/dL (ref 0.40–1.50)
GFR: 166.3 mL/min (ref >60.00)
Glucose, Bld: 88 mg/dL (ref 70–99)
POTASSIUM: 3.6 meq/L (ref 3.5–5.1)
SODIUM: 137 meq/L (ref 135–145)
Total Bilirubin: 0.4 mg/dL (ref 0.2–1.2)
Total Protein: 8.1 g/dL (ref 6.0–8.3)

## 2017-10-07 LAB — HEMOGLOBIN A1C: HEMOGLOBIN A1C: 6.4 % (ref 4.6–6.5)

## 2017-10-07 MED ORDER — QUINAPRIL HCL 40 MG PO TABS: 40.0000 mg | ORAL_TABLET | Freq: Every day | ORAL | 0 refills | Status: DC

## 2017-10-07 NOTE — Patient Instructions (Signed)
Thank you for choosing [facility].  SUMMARY AND INSTRUCTIONS:  Please continue to take your medications as prescribed.   We will check your blood today.   Continue Farxiga until blood work results.  Follow up pending blood or sooner if needed.   Medication:  Your prescription(s) have been submitted to your pharmacy or been printed and provided for you. Please take as directed and contact our office if you believe you are having problem(s) with the medication(s) or have any questions.  Labs:  Please stop by the lab on the lower level of the building for your blood work. Your results will be released to [facility] (or called to you) after review, usually within 72 hours after test completion. If any changes need to be made, you will be notified at that same time.  1.) The lab is open from 7:30am to 5:30 pm Monday-Friday 2.) No appointment is necessary 3.) Fasting (if needed) is 6-8 hours after food and drink; black coffee and water are okay   Follow up:  If your symptoms worsen or fail to improve, please contact our office for further instruction, or in case of emergency go directly to the emergency room at the closest medical facility.

## 2017-10-07 NOTE — Progress Notes (Signed)
Subjective:    Patient ID: Chris Watkins, male    DOB: [birth year], 46 y.o.   MRN: [id number]  Chief Complaint  Patient presents with  . Follow-up    HPI:  Chris Watkins is a 46 y.o. male who  has a past medical history of Chicken pox; Diabetes mellitus without complication ([place]); and Hypertension. and presents today for a follow up office visit.  1.) Diabetes - Currently prescribed metformin, glimepiride and Farxiga. Reports taking the medication as prescribed and denies adverse side effects or hypoglycemic readings. Blood sugars at home have been well controlled . Denies new symptoms of end organ damage. No excessive hunger, thirst, or urination. Working on a low/carbohydrate modified oral intake.   Lab Results  Component Value Date   HGBA1C 6.4 10/07/2017    Lab Results  Component Value Date   CREATININE 0.66 10/07/2017   BUN 13 10/07/2017   NA 137 10/07/2017   K 3.6 10/07/2017   CL 101 10/07/2017   CO2 29 10/07/2017    2.) Hypertension - Currently prescribed quinapril, hydrochlorothiazide, and amlodipine. Reports taking the medications as prescribed and denies adverse side effects or hypotensive readings. Blood pressures at work have been well controlled. Denies changes in vision, worst headache of life or new symptoms of end organ damage. Working on following a low sodium diet. Physical activity level   BP Readings from Last 3 Encounters:  10/07/17 128/82  06/21/17 122/82  06/20/17 124/74    No Known Allergies    Outpatient Medications Prior to Visit  Medication Sig Dispense Refill  . amLODipine (NORVASC) 10 MG tablet Take 1 tablet (10 mg total) by mouth daily. 90 tablet 2  . blood glucose meter kit and supplies Dispense based on patient and insurance preference. Check fasting blood sugar daily. Diagnosis: Diabetes E11.9 1 each 0  . clotrimazole-betamethasone (LOTRISONE) cream APPLY 1 APPLICATION TOPICALLY 2 (TWO) TIMES DAILY. 30 g 0  . dapagliflozin propanediol  (FARXIGA) 5 MG TABS tablet Take 1 tablet by mouth daily. Needs office visit for more refills. 30 tablet 0  . Diclofenac Sodium (PENNSAID) 2 % SOLN Place 1 application onto the skin 2 (two) times daily as needed. 112 g 1  . glimepiride (AMARYL) 4 MG tablet TAKE 2 TABLETS BY MOUTH EVERY DAY WITH BREAKFAST. 180 tablet 2  . glucose blood (FREESTYLE LITE) test strip Use to check blood sugars twice a day. Dx E11.9 100 each 3  . glucose monitoring kit (FREESTYLE) monitoring kit Use to check blood sugars Dx E11.9 1 each 0  . hydrochlorothiazide (HYDRODIURIL) 25 MG tablet TAKE 1 TABLET BY MOUTH EVERY DAY 90 tablet 0  . Ibuprofen-Famotidine 800-26.6 MG TABS Take 1 tablet by mouth 3 (three) times daily as needed. 90 tablet 1  . Lancets (FREESTYLE) lancets Use to check blood sugars twice a day Dx E11.9 100 each 3  . metFORMIN (GLUCOPHAGE-XR) 500 MG 24 hr tablet TAKE 2 TABLETS BY MOUTH DAILY WITH BREAKFAST 180 tablet 0  . fluconazole (DIFLUCAN) 150 MG tablet Take 1 tablet (150 mg total) by mouth once a week. 3 tablet 1  . quinapril (ACCUPRIL) 40 MG tablet TAKE 1 TABLET (40 MG TOTAL) BY MOUTH AT BEDTIME. 90 tablet 0   No facility-administered medications prior to visit.       Past Surgical History:  Procedure Laterality Date  . HERNIA REPAIR    . SLIPPED CAPITAL FEMORAL EPIPHYSIS PINNING        Past Medical History:  Diagnosis  Date  . Chicken pox   . Diabetes mellitus without complication ([place])   . Hypertension       Review of Systems  Constitutional: Negative for chills and fever.  Eyes:       Negative for changes in vision  Respiratory: Negative for cough, chest tightness, shortness of breath and wheezing.   Cardiovascular: Negative for chest pain, palpitations and leg swelling.  Endocrine: Negative for polydipsia, polyphagia and polyuria.  Neurological: Negative for dizziness, weakness, light-headedness and headaches.      Objective:    BP 128/82 (BP Location: Left Arm, Patient  Position: Sitting, Cuff Size: Large)   Pulse 88   Temp 97.8 F (36.6 C) (Oral)   Resp 16   Ht _0  (1.803 m)   Wt 265 lb (120.2 kg)   SpO2 97%   BMI 36.96 kg/m  Nursing note and vital signs reviewed.  Physical Exam  Constitutional: He is oriented to person, place, and time. He appears well-developed and well-nourished. No distress.  Cardiovascular: Normal rate, regular rhythm, normal heart sounds and intact distal pulses.  Exam reveals no gallop and no friction rub.   No murmur heard. Pulmonary/Chest: Effort normal and breath sounds normal. No respiratory distress. He has no wheezes. He has no rales. He exhibits no tenderness.  Neurological: He is alert and oriented to person, place, and time.  Skin: Skin is warm and dry.  Psychiatric: He has a normal mood and affect. His behavior is normal. Judgment and thought content normal.       Assessment & Plan:   Problem List Items Addressed This Visit      Cardiovascular and Mediastinum   HTN (hypertension)    Blood pressure well controlled with current medication regimen and no adverse side effects. Continue current dosage of quinapril, hydrochlorothiazide, and amlodipine. Encouraged to monitor blood pressure at home once daily. Follow low-sodium diet. Denies worse headache of life with no new symptoms of end organ damage noted on physical exam.      Relevant Medications   quinapril (ACCUPRIL) 40 MG tablet   Other Relevant Orders   Comprehensive metabolic panel (Completed)     Endocrine   Type 2 diabetes mellitus ([place]) - Primary    Type 2 diabetes appears improved based on home blood sugar readings and recent weight loss. Obtain hemoglobin A1c and urine microalbumin. Obtain complete metabolic profile for therapeutic drug monitoring. Continue current dosage of metformin and glimepiride. Hold Farxiga pending A1c results. Maintained on quinapril for CAD risk reduction. Follow up pending A1c results.       Relevant Medications    quinapril (ACCUPRIL) 40 MG tablet   Other Relevant Orders   Hemoglobin A1c (Completed)   Urine Microalbumin w/creat. ratio       I have discontinued Mr. Danh fluconazole. I am also having him maintain his blood glucose meter kit and supplies, amLODipine, Diclofenac Sodium, glimepiride, Ibuprofen-Famotidine, clotrimazole-betamethasone, glucose blood, glucose monitoring kit, freestyle, hydrochlorothiazide, metFORMIN, dapagliflozin propanediol, and quinapril.   Meds ordered this encounter  Medications  . quinapril (ACCUPRIL) 40 MG tablet    Sig: Take 1 tablet (40 mg total) by mouth at bedtime.    Dispense:  90 tablet    Refill:  0    Order Specific Question:   Supervising Provider    Answer:   Pricilla Holm A [[id number]]     Follow-up: Return in about 6 months (around 04/07/2018), or if symptoms worsen or fail to improve.  Mauricio Po, FNP

## 2017-10-07 NOTE — Assessment & Plan Note (Signed)
Blood pressure well controlled with current medication regimen and no adverse side effects. Continue current dosage of quinapril, hydrochlorothiazide, and amlodipine. Encouraged to monitor blood pressure at home once daily. Follow low-sodium diet. Denies worse headache of life with no new symptoms of end organ damage noted on physical exam.

## 2017-10-07 NOTE — Assessment & Plan Note (Signed)
Type 2 diabetes appears improved based on home blood sugar readings and recent weight loss. Obtain hemoglobin A1c and urine microalbumin. Obtain complete metabolic profile for therapeutic drug monitoring. Continue current dosage of metformin and glimepiride. Hold Farxiga pending A1c results. Maintained on quinapril for CAD risk reduction. Follow up pending A1c results.

## 2017-10-10 ENCOUNTER — Telehealth: Payer: Self-pay | Admitting: Family

## 2017-10-10 NOTE — Telephone Encounter (Signed)
LVM for patient to sch appt to transfer to [facility] in 3 months for a 2mo fu

## 2017-10-17 NOTE — Telephone Encounter (Signed)
Got patient scheduled

## 2017-12-06 ENCOUNTER — Other Ambulatory Visit: Payer: Self-pay | Admitting: Family

## 2017-12-12 ENCOUNTER — Other Ambulatory Visit: Payer: Self-pay | Admitting: *Deleted

## 2017-12-12 MED ORDER — METFORMIN HCL ER 500 MG PO TB24: ORAL_TABLET | ORAL | 0 refills | Status: DC

## 2017-12-15 ENCOUNTER — Telehealth: Payer: Self-pay | Admitting: Family

## 2017-12-15 NOTE — Telephone Encounter (Signed)
Copied from [place]. Topic: Quick Communication - Rx Refill/Question >> Dec 15, 2017  3:50 PM Marin Olp L wrote: Has the patient contacted their pharmacy? Yes. No. In between docs has appt w/ [facility] in january   (Agent: If no, request that the patient contact the pharmacy for the refill.) Preferred Pharmacy (with phone number or street name): [id number] [address], [place]: Please be advised that RX refills may take up to 3 business days. We ask that you follow-up with your pharmacy. Refill amLODipine (NORVASC) 10 MG tablet., hydrochlorothiazide (HYDRODIURIL) 25 MG tablet,  quinapril (ACCUPRIL) 40 MG tablet

## 2017-12-16 MED ORDER — AMLODIPINE BESYLATE 10 MG PO TABS: 10.0000 mg | ORAL_TABLET | Freq: Every day | ORAL | 0 refills | Status: DC

## 2017-12-16 MED ORDER — QUINAPRIL HCL 40 MG PO TABS: 40.0000 mg | ORAL_TABLET | Freq: Every day | ORAL | 0 refills | Status: DC

## 2017-12-16 MED ORDER — HYDROCHLOROTHIAZIDE 25 MG PO TABS: 25.0000 mg | ORAL_TABLET | Freq: Every day | ORAL | 0 refills | Status: DC

## 2017-12-16 NOTE — Telephone Encounter (Signed)
Per office policy sent 30 day to local pharmacy until appt.../lmb  

## 2017-12-16 NOTE — Telephone Encounter (Signed)
Will [employer] refill this prior to seeing her?

## 2018-01-09 ENCOUNTER — Other Ambulatory Visit: Payer: Self-pay | Admitting: Nurse Practitioner

## 2018-01-12 ENCOUNTER — Other Ambulatory Visit: Payer: Self-pay | Admitting: Nurse Practitioner

## 2018-01-18 ENCOUNTER — Ambulatory Visit (INDEPENDENT_AMBULATORY_CARE_PROVIDER_SITE_OTHER): Payer: BLUE CROSS/BLUE SHIELD | Admitting: Nurse Practitioner

## 2018-01-18 ENCOUNTER — Encounter: Payer: Self-pay | Admitting: Nurse Practitioner

## 2018-01-18 ENCOUNTER — Other Ambulatory Visit (INDEPENDENT_AMBULATORY_CARE_PROVIDER_SITE_OTHER): Payer: BLUE CROSS/BLUE SHIELD

## 2018-01-18 VITALS — BP 136/70 | HR 89 | Temp 97.7°F | Resp 16 | Ht 71.0 in | Wt 277.1 lb

## 2018-01-18 DIAGNOSIS — I10 Essential (primary) hypertension: Secondary | ICD-10-CM

## 2018-01-18 DIAGNOSIS — E119 Type 2 diabetes mellitus without complications: Secondary | ICD-10-CM

## 2018-01-18 LAB — HEMOGLOBIN A1C: Hgb A1c MFr Bld: 6.4 % (ref 4.6–6.5)

## 2018-01-18 MED ORDER — QUINAPRIL HCL 40 MG PO TABS: 40.0000 mg | ORAL_TABLET | Freq: Every day | ORAL | 2 refills | Status: DC

## 2018-01-18 MED ORDER — HYDROCHLOROTHIAZIDE 25 MG PO TABS: 25.0000 mg | ORAL_TABLET | Freq: Every day | ORAL | 2 refills | Status: DC

## 2018-01-18 MED ORDER — REPAGLINIDE 1 MG PO TABS: 1.0000 mg | ORAL_TABLET | Freq: Three times a day (TID) | ORAL | 1 refills | Status: DC

## 2018-01-18 MED ORDER — METFORMIN HCL ER 500 MG PO TB24: ORAL_TABLET | ORAL | 3 refills | Status: DC

## 2018-01-18 MED ORDER — AMLODIPINE BESYLATE 10 MG PO TABS: 10.0000 mg | ORAL_TABLET | Freq: Every day | ORAL | 2 refills | Status: DC

## 2018-01-18 NOTE — Assessment & Plan Note (Signed)
Episodes of hypoglycemia, suspect related to glimepiride and decreased PO intake We will stop glimepiride and start prandin. Continue metformin He'll return in 1 month for follow up and bring blood sugar logs with him. We discussed return precautions and treating hypoglycemia-see AVS for instructions given to patient. - Hemoglobin A1c; Future - repaglinide (PRANDIN) 1 MG tablet; Take 1 tablet (1 mg total) by mouth 3 (three) times daily before meals.  Dispense: 90 tablet; Refill: 1 - metFORMIN (GLUCOPHAGE-XR) 500 MG 24 hr tablet; TAKE 2 TABLETS BY MOUTH DAILY WITH BREAKFAST  Dispense: 180 tablet; Refill: 3 - HM DIABETES FOOT EXAM; Future

## 2018-01-18 NOTE — Patient Instructions (Addendum)
Please head downstairs for lab work.  STOP glimepiride. START prandin 1 mg three times a day with meals. Continue metformin as you have been taking.  Please return in about 1 month with your blood sugar logs so we can make any adjustments needed.  Please let me know if you continue to get low readings below 70 or if your readings start to rise above 150 consistently.  It was good to meet you. Thanks for letting me take care of you today :)   Hypoglycemia Hypoglycemia is when the sugar (glucose) level in the blood is too low. Symptoms of low blood sugar may include:  Feeling: ? Hungry. ? Worried or nervous (anxious). ? Sweaty and clammy. ? Confused. ? Dizzy. ? Sleepy. ? Sick to your stomach (nauseous).  Having: ? A fast heartbeat. ? A headache. ? A change in your vision. ? Jerky movements that you cannot control (seizure). ? Nightmares. ? Tingling or no feeling (numbness) around the mouth, lips, or tongue.  Having trouble with: ? Talking. ? Paying attention (concentrating). ? Moving (coordination). ? Sleeping.  Shaking.  Passing out (fainting).  Getting upset easily (irritability).  Low blood sugar can happen to people who have diabetes and people who do not have diabetes. Low blood sugar can happen quickly, and it can be an emergency. Treating Low Blood Sugar Low blood sugar is often treated by eating or drinking something sugary right away. If you can think clearly and swallow safely, follow the 15:15 rule:  Take 15 grams of a fast-acting carb (carbohydrate). Some fast-acting carbs are: ? 1 tube of glucose gel. ? 3 sugar tablets (glucose pills). ? 6-8 pieces of hard candy. ? 4 oz (120 mL) of fruit juice. ? 4 oz (120 mL) of regular (not diet) soda.  Check your blood sugar 15 minutes after you take the carb.  If your blood sugar is still at or below 70 mg/dL (3.9 mmol/L), take 15 grams of a carb again.  If your blood sugar does not go above 70 mg/dL (3.9  mmol/L) after 3 tries, get help right away.  After your blood sugar goes back to normal, eat a meal or a snack within 1 hour.  Treating Very Low Blood Sugar If your blood sugar is at or below 54 mg/dL (3 mmol/L), you have very low blood sugar (severe hypoglycemia). This is an emergency. Do not wait to see if the symptoms will go away. Get medical help right away. Call your local emergency services (911 in the [place] Do not drive yourself to the hospital. If you have very low blood sugar and you cannot eat or drink, you may need a glucagon shot (injection). A family member or friend should learn how to check your blood sugar and how to give you a glucagon shot. Ask your doctor if you need to have a glucagon shot kit at home. Follow these instructions at home: General instructions  Avoid any diets that cause you to not eat enough food. Talk with your doctor before you start any new diet.  Take over-the-counter and prescription medicines only as told by your doctor.  Limit alcohol to no more than 1 drink per day for nonpregnant women and 2 drinks per day for men. One drink equals 12 oz of beer, 5 oz of wine, or 1 oz of hard liquor.  Keep all follow-up visits as told by your doctor. This is important. If You Have Diabetes:   Make sure you know the symptoms of  low blood sugar.  Always keep a source of sugar with you, such as: ? Sugar. ? Sugar tablets. ? Glucose gel. ? Fruit juice. ? Regular soda (not diet soda). ? Milk. ? Hard candy. ? Honey.  Take your medicines as told.  Follow your exercise and meal plan. ? Eat on time. Do not skip meals. ? Follow your sick day plan when you cannot eat or drink normally. Make this plan ahead of time with your doctor.  Check your blood sugar as often as told by your doctor. Always check before and after exercise.  Share your diabetes care plan with: ? Your work or school. ? People you live with.  Check your pee (urine) for ketones: ? When  you are sick. ? As told by your doctor.  Carry a card or wear jewelry that says you have diabetes. If You Have Low Blood Sugar From Other Causes:   Check your blood sugar as often as told by your doctor.  Follow instructions from your doctor about what you cannot eat or drink. Contact a doctor if:  You have trouble keeping your blood sugar in your target range.  You have low blood sugar often. Get help right away if:  You still have symptoms after you eat or drink something sugary.  Your blood sugar is at or below 54 mg/dL (3 mmol/L).  You have jerky movements that you cannot control.  You pass out. These symptoms may be an emergency. Do not wait to see if the symptoms will go away. Get medical help right away. Call your local emergency services (911 in the [place] Do not drive yourself to the hospital. This information is not intended to replace advice given to you by your health care provider. Make sure you discuss any questions you have with your health care provider. Document Released: 03/09/2010 Document Revised: 05/20/2016 Document Reviewed: 01/16/2016 Elsevier Interactive Patient Education  [employer].

## 2018-01-18 NOTE — Assessment & Plan Note (Signed)
Stable, continue current medications. - Basic metabolic panel; Future - quinapril (ACCUPRIL) 40 MG tablet; Take 1 tablet (40 mg total) by mouth at bedtime.  Dispense: 90 tablet; Refill: 2 - hydrochlorothiazide (HYDRODIURIL) 25 MG tablet; Take 1 tablet (25 mg total) by mouth daily. Must keep scheduled appt w/new provider for future refills  Dispense: 90 tablet; Refill: 2 - amLODipine (NORVASC) 10 MG tablet; Take 1 tablet (10 mg total) by mouth daily. Must keep scheduled appt w/new provider for future refills  Dispense: 90 tablet; Refill: 2

## 2018-01-18 NOTE — Progress Notes (Signed)
Name: Chris Watkins   MRN: [id number]    DOB: 1971-12-13   Date:01/18/2018       Progress Note  Subjective  Chief Complaint  Chief Complaint  Patient presents with  . Establish Care    needs refills on all medications    HPI He is requesting refills of blood pressure and diabetes medications.  Diabetes- maintained on metformin 1000, amaryl 4 mg daily. Reports daily medication compliance. He reports since his last visit in October with his past provider, he has been having some low blood sugar readings. He always checks his blood sugar around 9 am after breakfast time. Some days he skips breakfast or eats his breakfast later, and on these days he will feel shaky and sweaty and his blood sugar will be around 60-70 when he checks it. Denies syncope, polyuria, polydipsia, polyphagia.  Lab Results  Component Value Date   HGBA1C 6.4 10/07/2017    Hypertension -maintained on amlodipine 10, hydrochlorothiazide 25, quinapril 40 once daily Reports daily medication compliance without adverse medication effects. Reports he does not routinely check BP at home but the nurse practitioner at his work checks about once a month with readings normally around 120s-140s/70s. Denies headaches, vision changes, chest pain, shortness of breath, edema.  BP Readings from Last 3 Encounters:  01/18/18 136/70  10/07/17 128/82  06/21/17 122/82     Patient Active Problem List   Diagnosis Date Noted  . Left hip pain 06/21/2017  . Knee MCL sprain 06/23/2016  . Tinea pedis 04/29/2016  . Right knee pain 10/20/2015  . Type 2 diabetes mellitus ([place]) 04/23/2015  . Routine general medical examination at a health care facility 04/23/2015  . HTN (hypertension) 04/09/2014    Past Surgical History:  Procedure Laterality Date  . HERNIA REPAIR    . SLIPPED CAPITAL FEMORAL EPIPHYSIS PINNING      Family History  Problem Relation Age of Onset  . Thyroid disease Mother   . Hypertension Father   . Diabetes  Father   . Diabetes Maternal Grandmother     Social History   Socioeconomic History  . Marital status: Married    Spouse name: Not on file  . Number of children: 3  . Years of education: 43  . Highest education level: Not on file  Social Needs  . Financial resource strain: Not on file  . Food insecurity - worry: Not on file  . Food insecurity - inability: Not on file  . Transportation needs - medical: Not on file  . Transportation needs - non-medical: Not on file  Occupational History  . Occupation: Facilities manager  Tobacco Use  . Smoking status: Current Every Day Smoker    Packs/day: 0.50    Types: Cigarettes  . Smokeless tobacco: Never Used  Substance and Sexual Activity  . Alcohol use: Yes    Comment: Has not since he was diagnosed with DM in 01/2015  . Drug use: No  . Sexual activity: Not on file  Other Topics Concern  . Not on file  Social History Narrative   Lives with his wife and children. Fun: Play golf, sing, listen to music, watch sports.     Denies religious beliefs effecting health care.      Current Outpatient Medications:  .  amLODipine (NORVASC) 10 MG tablet, Take 1 tablet (10 mg total) by mouth daily. Must keep scheduled appt w/new provider for future refills, Disp: 90 tablet, Rfl: 2 .  blood glucose meter kit and supplies, Dispense  based on patient and insurance preference. Check fasting blood sugar daily. Diagnosis: Diabetes E11.9, Disp: 1 each, Rfl: 0 .  glucose blood (FREESTYLE LITE) test strip, Use to check blood sugars twice a day. Dx E11.9, Disp: 100 each, Rfl: 3 .  glucose monitoring kit (FREESTYLE) monitoring kit, Use to check blood sugars Dx E11.9, Disp: 1 each, Rfl: 0 .  hydrochlorothiazide (HYDRODIURIL) 25 MG tablet, Take 1 tablet (25 mg total) by mouth daily. Must keep scheduled appt w/new provider for future refills, Disp: 90 tablet, Rfl: 2 .  Lancets (FREESTYLE) lancets, Use to check blood sugars twice a day Dx E11.9, Disp: 100 each, Rfl:  3 .  metFORMIN (GLUCOPHAGE-XR) 500 MG 24 hr tablet, TAKE 2 TABLETS BY MOUTH DAILY WITH BREAKFAST, Disp: 180 tablet, Rfl: 3 .  quinapril (ACCUPRIL) 40 MG tablet, Take 1 tablet (40 mg total) by mouth at bedtime., Disp: 90 tablet, Rfl: 2 .  repaglinide (PRANDIN) 1 MG tablet, Take 1 tablet (1 mg total) by mouth 3 (three) times daily before meals., Disp: 90 tablet, Rfl: 1  No Known Allergies   ROS See HPI  Objective  Vitals:   01/18/18 1555  BP: 136/70  Pulse: 89  Resp: 16  Temp: 97.7 F (36.5 C)  TempSrc: Oral  SpO2: 97%  Weight: 277 lb 1.9 oz (125.7 kg)  Height: 5' 11"  (1.803 m)   Body mass index is 38.65 kg/m.  Physical Exam Constitutional: Patient appears well-developed and well-nourished. No distress.  HENT: Head: Normocephalic and atraumatic. Nose: Nose normal. Mouth/Throat: Oropharynx is clear and moist. No oropharyngeal exudate.  Eyes: Conjunctivae normal. Pupils are equal, round, and reactive to light. No scleral icterus.  Neck: Normal range of motion. Neck supple. Cardiovascular: Normal rate, regular rhythm and normal heart sounds.  No murmur heard. No BLE edema. Distal pulses intact. Pulmonary/Chest: Effort normal and breath sounds normal. No respiratory distress. Musculoskeletal: Normal range of motion, no joint effusions. No gross deformities Neurological: He is alert and oriented to person, place, and time. No cranial nerve deficit. Coordination, balance, strength, speech and gait are normal.  Skin: Skin is warm and dry. No rash noted. No erythema. Dry skin to BLE. Psychiatric: Patient has a normal mood and affect. behavior is normal. Judgment and thought content normal.  Diabetic Foot Exam: Diabetic Foot Exam - Simple   Simple Foot Form Visual Inspection No deformities, no ulcerations, no other skin breakdown bilaterally:  Yes Sensation Testing Intact to touch and monofilament testing bilaterally:  Yes Pulse Check Posterior Tibialis and Dorsalis pulse intact  bilaterally:  Yes Comments Thickened dark toenails, dry skin    PHQ2/9: Depression screen PHQ 2/9 01/18/2018  Decreased Interest 0  Down, Depressed, Hopeless 0  PHQ - 2 Score 0    Fall Risk: Fall Risk  01/18/2018  Falls in the past year? No    Assessment & Plan RTC in 1 month for diabetes follow up. -Reviewed Health Maintenance: HM DIABETES FOOT EXAM; Future  1. Essential hypertension   2. Type 2 diabetes mellitus without complication, without long-term current use of insulin ([place])

## 2018-01-19 LAB — BASIC METABOLIC PANEL
BUN: 16 mg/dL (ref 6–23)
CO2: 30 mEq/L (ref 19–32)
CREATININE: 0.66 mg/dL (ref 0.40–1.50)
Calcium: 9 mg/dL (ref 8.4–10.5)
Chloride: 100 mEq/L (ref 96–112)
GFR: 166.1 mL/min (ref >60.00)
GLUCOSE: 100 mg/dL — AB (ref 70–99)
POTASSIUM: 3.9 meq/L (ref 3.5–5.1)
Sodium: 137 mEq/L (ref 135–145)

## 2018-02-24 ENCOUNTER — Other Ambulatory Visit (INDEPENDENT_AMBULATORY_CARE_PROVIDER_SITE_OTHER): Payer: BLUE CROSS/BLUE SHIELD

## 2018-02-24 ENCOUNTER — Ambulatory Visit: Payer: BLUE CROSS/BLUE SHIELD | Admitting: Nurse Practitioner

## 2018-02-24 ENCOUNTER — Encounter: Payer: Self-pay | Admitting: Nurse Practitioner

## 2018-02-24 VITALS — BP 136/68 | HR 91 | Temp 98.1°F | Resp 16 | Ht 71.0 in | Wt 280.0 lb

## 2018-02-24 DIAGNOSIS — E119 Type 2 diabetes mellitus without complications: Secondary | ICD-10-CM

## 2018-02-24 DIAGNOSIS — N529 Male erectile dysfunction, unspecified: Secondary | ICD-10-CM

## 2018-02-24 LAB — PSA: PSA: 0.71 ng/mL (ref 0.10–4.00)

## 2018-02-24 LAB — TESTOSTERONE: Testosterone: 201.17 ng/dL — ABNORMAL LOW (ref 300.00–890.00)

## 2018-02-24 LAB — TSH: TSH: 1.99 u[IU]/mL (ref 0.35–4.50)

## 2018-02-24 MED ORDER — SILDENAFIL CITRATE 100 MG PO TABS: 50.0000 mg | ORAL_TABLET | Freq: Every day | ORAL | 11 refills | Status: DC | PRN

## 2018-02-24 NOTE — Patient Instructions (Addendum)
Please head downstairs for lab work.  STOP prandin. CONTINUE metformin, 1 tablet in the morning and 1 tablet in the evening for your diabetes. Please continue to monitor your blood sugar and let me know if you are getting readings over 150s. We may increase your metformin dosage if this occurs.  I have sent a prescription for viagra to your pharmacy. You can take 1/2 to 1 tablet viagra about 30 minutes to 1 hour before sexual activity. This medication can cause a headache. Avoid high fat meals when you take viagra. You can have generic viagra filled at either: [facility] [place], [place] [address]  [phone]   Or   [place], [address]  Please return in about 1 month so I can see how you are doing. Thanks for letting me take care of you today :)   Erectile Dysfunction Erectile dysfunction (ED) is the inability to get or keep an erection in order to have sexual intercourse. Erectile dysfunction may include:  Inability to get an erection.  Lack of enough hardness of the erection to allow penetration.  Loss of the erection before sex is finished.  What are the causes? This condition may be caused by:  Certain medicines, such as: ? Pain relievers. ? Antihistamines. ? Antidepressants. ? Blood pressure medicines. ? Water pills (diuretics). ? Ulcer medicines. ? Muscle relaxants. ? Drugs.  Excessive drinking.  Psychological causes, such as: ? Anxiety. ? Depression. ? Sadness. ? Exhaustion. ? Performance fear. ? Stress.  Physical causes, such as: ? Artery problems. This may include diabetes, smoking, liver disease, or atherosclerosis. ? High blood pressure. ? Hormonal problems, such as low testosterone. ? Obesity. ? Nerve problems. This may include back or pelvic injuries, diabetes mellitus, multiple sclerosis, or Parkinson disease.  What are the signs or symptoms? Symptoms of this  condition include:  Inability to get an erection.  Lack of enough hardness of the erection to allow penetration.  Loss of the erection before sex is finished.  Normal erections at some times, but with frequent unsatisfactory episodes.  Low sexual satisfaction in either partner due to erection problems.  A curved penis occurring with erection. The curve may cause pain or the penis may be too curved to allow for intercourse.  Never having nighttime erections.  How is this diagnosed? This condition is often diagnosed by:  Performing a physical exam to find other diseases or specific problems with the penis.  Asking you detailed questions about the problem.  Performing blood tests to check for diabetes mellitus or to measure hormone levels.  Performing other tests to check for underlying health conditions.  Performing an ultrasound exam to check for scarring.  Performing a test to check blood flow to the penis.  Doing a sleep study at home to measure nighttime erections.  How is this treated? This condition may be treated by:  Medicine taken by mouth to help you achieve an erection (oral medicine).  Hormone replacement therapy to replace low testosterone levels.  Medicine that is injected into the penis. Your health care provider may instruct you how to give yourself these injections at home.  Vacuum pump. This is a pump with a ring on it. The pump and ring are placed on the penis and used to create pressure that helps the penis become erect.  Penile implant surgery. In this procedure, you may receive: ? An inflatable implant. This consists of cylinders, a pump,  and a reservoir. The cylinders can be inflated with a fluid that helps to create an erection, and they can be deflated after intercourse. ? A semi-rigid implant. This consists of two silicone rubber rods. The rods provide some rigidity. They are also flexible, so the penis can both curve downward in its normal  position and become straight for sexual intercourse.  Blood vessel surgery, to improve blood flow to the penis. During this procedure, a blood vessel from a different part of the body is placed into the penis to allow blood to flow around (bypass) damaged or blocked blood vessels.  Lifestyle changes, such as exercising more, losing weight, and quitting smoking.  Follow these instructions at home: Medicines  Take over-the-counter and prescription medicines only as told by your health care provider. Do not increase the dosage without first discussing it with your health care provider.  If you are using self-injections, perform injections as directed by your health care provider. Make sure to avoid any veins that are on the surface of the penis. After giving an injection, apply pressure to the injection site for 5 minutes. General instructions  Exercise regularly, as directed by your health care provider. Work with your health care provider to lose weight, if needed.  Do not use any products that contain nicotine or tobacco, such as cigarettes and e-cigarettes. If you need help quitting, ask your health care provider.  Before using a vacuum pump, read the instructions that come with the pump and discuss any questions with your health care provider.  Keep all follow-up visits as told by your health care provider. This is important. Contact a health care provider if:  You feel nauseous.  You vomit. Get help right away if:  You are taking oral or injectable medicines and you have an erection that lasts longer than 4 hours. If your health care provider is unavailable, go to the nearest emergency room for evaluation. An erection that lasts much longer than 4 hours can result in permanent damage to your penis.  You have severe pain in your groin or abdomen.  You develop redness or severe swelling of your penis.  You have redness spreading up into your groin or lower abdomen.  You are  unable to urinate.  You experience chest pain or a rapid heart beat (palpitations) after taking oral medicines. Summary  Erectile dysfunction (ED) is the inability to get or keep an erection during sexual intercourse. This problem can usually be treated successfully.  This condition is diagnosed based on a physical exam, your symptoms, and tests to determine the cause. Treatment varies depending on the cause, and may include medicines, hormone therapy, surgery, or vacuum pump.  You may need follow-up visits to make sure that you are using your medicines or devices correctly.  Get help right away if you are taking or injecting medicines and you have an erection that lasts longer than 4 hours. This information is not intended to replace advice given to you by your health care provider. Make sure you discuss any questions you have with your health care provider. Document Released: 12/10/2000 Document Revised: 12/29/2016 Document Reviewed: 12/29/2016 Elsevier Interactive Patient Education  2017 [employer].

## 2018-02-24 NOTE — Progress Notes (Signed)
Name: Chris Watkins   MRN: [id number]    DOB: Mar 23, 1971   Date:02/24/2018       Progress Note  Subjective  Chief Complaint  Chief Complaint  Patient presents with  . Follow-up    diabetes    HPI  Diabetes-  We discontinued his glimepiride and started prandin 26m TID at last visit due to episodes of hypoglycemia. He has continued metformin XR 10078mdaily. He says that overall he has been tolerating the medication adjustment well but did have one episode of hypoglycemia last week- he felt shaky and his glucose dropped to 50. He ate a snack and his sugar went back up, hes not had any more low episodes since He check his blood sugar about every 2 days with highest readings in the 120s on new medication regimen Denies polyuria, polydipsia, polyphagia. He has been trying to eat three balanced meals a day to avoid hypoglycemia. He tells me today that when he was first diagnosed with diabetes he was a heavy drinker and did not wach his diet, he has completely stopped drinking alcohol and noticed blood sugars much lower since. He wonders if we can reduce his medications altogether He has not had eye exam this year but did like the ophthalmologist he was referred last year and would like to go back to them for this years eye exam  Lab Results  Component Value Date   HGBA1C 6.4 01/18/2018    Erectile dyfunction- This is not a new problem This has actually been occurring for a year, maybe longer but he has been nervous to discuss this He Feels like having sex but can not get aroused or maintain an erection. He is wondering what his options are to improve this. He denies fevers, abdominal pain, nausea, vomiting, dysuria, hematuria, penile pain or discharge, weakened urinary stream, constipation, diarrhea.   Patient Active Problem List   Diagnosis Date Noted  . Left hip pain 06/21/2017  . Knee MCL sprain 06/23/2016  . Tinea pedis 04/29/2016  . Right knee pain 10/20/2015  . Type 2 diabetes  [place]itus [place]/27/2016  . Routine general medical examination at a health care facility 04/23/2015  . HTN (hypertension) 04/09/2014    Past Surgical History:  Procedure Laterality Date  . HERNIA REPAIR    . SLIPPED CAPITAL FEMORAL EPIPHYSIS PINNING      Family History  Problem Relation Age of Onset  . Thyroid disease Mother   . Hypertension Father   . Diabetes Father   . Diabetes Maternal Grandmother     Social History   Socioeconomic History  . Marital status: Married    Spouse name: Not on file  . Number of children: 3  . Years of education: 1487. Highest education level: Not on file  Social Needs  . Financial resource strain: Not on file  . Food insecurity - worry: Not on file  . Food insecurity - inability: Not on file  . Transportation needs - medical: Not on file  . Transportation needs - non-medical: Not on file  Occupational History  . Occupation: LaFacilities managerTobacco Use  . Smoking status: Current Every Day Smoker    Packs/day: 0.50    Types: Cigarettes  . Smokeless tobacco: Never Used  Substance and Sexual Activity  . Alcohol use: Yes    Comment: Has not since he was diagnosed with DM in 01/2015  . Drug use: No  . Sexual activity: Not on file  Other Topics Concern  .  Not on file  Social History Narrative   Lives with his wife and children. Fun: Play golf, sing, listen to music, watch sports.     Denies religious beliefs effecting health care.      Current Outpatient Medications:  .  amLODipine (NORVASC) 10 MG tablet, Take 1 tablet (10 mg total) by mouth daily. Must keep scheduled appt w/new provider for future refills, Disp: 90 tablet, Rfl: 2 .  blood glucose meter kit and supplies, Dispense based on patient and insurance preference. Check fasting blood sugar daily. Diagnosis: Diabetes E11.9, Disp: 1 each, Rfl: 0 .  glucose blood (FREESTYLE LITE) test strip, Use to check blood sugars twice a day. Dx E11.9, Disp: 100 each, Rfl: 3 .  glucose  monitoring kit (FREESTYLE) monitoring kit, Use to check blood sugars Dx E11.9, Disp: 1 each, Rfl: 0 .  hydrochlorothiazide (HYDRODIURIL) 25 MG tablet, Take 1 tablet (25 mg total) by mouth daily. Must keep scheduled appt w/new provider for future refills, Disp: 90 tablet, Rfl: 2 .  Lancets (FREESTYLE) lancets, Use to check blood sugars twice a day Dx E11.9, Disp: 100 each, Rfl: 3 .  metFORMIN (GLUCOPHAGE-XR) 500 MG 24 hr tablet, TAKE 2 TABLETS BY MOUTH DAILY WITH BREAKFAST, Disp: 180 tablet, Rfl: 3 .  quinapril (ACCUPRIL) 40 MG tablet, Take 1 tablet (40 mg total) by mouth at bedtime., Disp: 90 tablet, Rfl: 2 .  repaglinide (PRANDIN) 1 MG tablet, Take 1 tablet (1 mg total) by mouth 3 (three) times daily before meals., Disp: 90 tablet, Rfl: 1  No Known Allergies   ROS See HPI  Objective  Vitals:   02/24/18 1550  BP: 136/68  Pulse: 91  Resp: 16  Temp: 98.1 F (36.7 C)  TempSrc: Oral  SpO2: 97%  Weight: 280 lb (127 kg)  Height: 5' 11"  (1.803 m)   Body mass index is 39.05 kg/m.  Physical Exam Vital signs reviewed. Constitutional: Patient appears well-developed and well-nourished. No distress.  HENT: Head: Normocephalic and atraumatic. Nose: Nose normal. Mouth/Throat: Oropharynx is clear and moist. Eyes: Conjunctivae normal. Pupils are equal, round, and reactive to light. No scleral icterus.  Neck: Normal range of motion. Neck supple. Cardiovascular: Normal rate, regular rhythm and normal heart sounds.  No murmur heard. No BLE edema. Distal pulses intact. Pulmonary/Chest: Effort normal and breath sounds normal. No respiratory distress. Musculoskeletal: Normal range of motion, no joint effusions. No gross deformities Neurological: He is alert and oriented to person, place, and time. No cranial nerve deficit. Coordination, balance, strength, speech and gait are normal.  Skin: Skin is warm and dry. No rash noted. No erythema. Psychiatric: Patient has a normal mood and affect. behavior  is normal. Judgment and thought content normal.  Recent Results (from the past 2160 hour(s))  Basic metabolic panel     Status: Abnormal   Collection Time: 01/18/18  4:42 PM  Result Value Ref Range   Sodium 137 135 - 145 mEq/L   Potassium 3.9 3.5 - 5.1 mEq/L   Chloride 100 96 - 112 mEq/L   CO2 30 19 - 32 mEq/L   Glucose, Bld 100 (H) 70 - 99 mg/dL   BUN 16 6 - 23 mg/dL   Creatinine, Ser 0.66 0.40 - 1.50 mg/dL   Calcium 9.0 8.4 - 10.5 mg/dL   GFR 166.10 >60.00 mL/min  Hemoglobin A1c     Status: None   Collection Time: 01/18/18  4:42 PM  Result Value Ref Range   Hgb A1c MFr Bld 6.4 4.6 -  6.5 %    Comment: Glycemic Control Guidelines for People with Diabetes:Non Diabetic:  <6%Goal of Therapy: <7%Additional Action Suggested:  >8%      Assessment & Plan RTC in about 1 month for follow up of DM-changing medication regimen, ED-starting viagra  -Reviewed Health Maintenance: ophthalmology exam ordered today  1. Type 2 diabetes mellitus without complication, without long-term current use of insulin ([place]) y  2. Erectile dysfunction, unspecified erectile dysfunction type

## 2018-02-25 ENCOUNTER — Encounter: Payer: Self-pay | Admitting: Nurse Practitioner

## 2018-02-25 DIAGNOSIS — N529 Male erectile dysfunction, unspecified: Secondary | ICD-10-CM | POA: Insufficient documentation

## 2018-02-25 NOTE — Assessment & Plan Note (Signed)
We discussed that ED is multifactorial in origin and likely caused by a combination of chronic conditions, daily medications. We discussed that improving control of DM, HTN will likely improve ED Labs ordered today and Rx for viagra given- we discussed dosing and side effects fo Viagra. See AVS for education provided to patient He will return in 1 month for follow up. - Testosterone; Future - TSH; Future - PSA; Future - sildenafil (VIAGRA) 100 MG tablet; Take 0.5-1 tablets (50-100 mg total) by mouth daily as needed for erectile dysfunction.  Dispense: 5 tablet; Refill: 11

## 2018-02-25 NOTE — Assessment & Plan Note (Signed)
Recent a1c shows good control of diabetes at 6.4, and he is having low sugars on prandin. I think it would be reasonable to stop prandin and continue metormin at currrent dosage to see how he does- we discussed splitting his current metformin dosage 1000mg  to 500mg  in am and 500 mg in pm to hopefully keep his blood sugar stable throughout the day. He will RTC in 1 month for follow up and notify me if he has readings over 150s at home prior to his follow up. We can increase metformin If needed. Next A1c will be due around [facility]

## 2018-03-18 ENCOUNTER — Other Ambulatory Visit: Payer: Self-pay | Admitting: Nurse Practitioner

## 2018-03-18 DIAGNOSIS — E119 Type 2 diabetes mellitus without complications: Secondary | ICD-10-CM

## 2018-04-10 ENCOUNTER — Ambulatory Visit: Payer: BLUE CROSS/BLUE SHIELD | Admitting: Nurse Practitioner

## 2018-04-10 ENCOUNTER — Encounter: Payer: Self-pay | Admitting: Nurse Practitioner

## 2018-04-10 VITALS — BP 126/70 | HR 95 | Temp 98.6°F | Resp 16 | Ht 71.0 in | Wt 273.1 lb

## 2018-04-10 DIAGNOSIS — N529 Male erectile dysfunction, unspecified: Secondary | ICD-10-CM | POA: Diagnosis not present

## 2018-04-10 DIAGNOSIS — E119 Type 2 diabetes mellitus without complications: Secondary | ICD-10-CM | POA: Diagnosis not present

## 2018-04-10 NOTE — Progress Notes (Signed)
Name: Chris Watkins   MRN: [id number]    DOB: [birth year]   Date:04/10/2018       Progress Note  Subjective  Chief Complaint  Chief Complaint  Patient presents with  . Follow-up    HPI  Diabetes- at his last OV we stopped prandin due to hypoglycemia and continued metformin at 500 in am and 500 in pm. Reports he has continued the metformin as instructed. Reports he checks fasting morning glucose daily with readings mostly low 100s, occasionally up to 130. Sometimes he checks in the evening-readings typically around 130 before dinner. He has not had any readings over 150, under 90. Denies tremor, diaphoresis, polyuria, polydipsia, polyphagia.  Lab Results  Component Value Date   HGBA1C 6.4 01/18/2018   Erectile dysfunction- viagra Rx given and labs ordered at last OV He was referred to urology for low testosterone on lab work but did not hear about the referral yet. He tried the viagra but did not like it and does not intend to continue. He would like to see urology for further evaluation and treatment options.  Patient Active Problem List   Diagnosis Date Noted  . Erectile dysfunction 02/25/2018  . Type 2 diabetes mellitus ([place]) 04/23/2015  . Routine general medical examination at a health care facility 04/23/2015  . HTN (hypertension) 04/09/2014    Past Surgical History:  Procedure Laterality Date  . HERNIA REPAIR    . SLIPPED CAPITAL FEMORAL EPIPHYSIS PINNING      Family History  Problem Relation Age of Onset  . Thyroid disease Mother   . Hypertension Father   . Diabetes Father   . Diabetes Maternal Grandmother     Social History   Socioeconomic History  . Marital status: Married    Spouse name: Not on file  . Number of children: 3  . Years of education: 31  . Highest education level: Not on file  Occupational History  . Occupation: Facilities manager  Social Needs  . Financial resource strain: Not on file  . Food insecurity:    Worry: Not on file     Inability: Not on file  . Transportation needs:    Medical: Not on file    Non-medical: Not on file  Tobacco Use  . Smoking status: Current Every Day Smoker    Packs/day: 0.50    Types: Cigarettes  . Smokeless tobacco: Never Used  Substance and Sexual Activity  . Alcohol use: Yes    Comment: Has not since he was diagnosed with DM in 01/2015  . Drug use: No  . Sexual activity: Not on file  Lifestyle  . Physical activity:    Days per week: Not on file    Minutes per session: Not on file  . Stress: Not on file  Relationships  . Social connections:    Talks on phone: Not on file    Gets together: Not on file    Attends religious service: Not on file    Active member of club or organization: Not on file    Attends meetings of clubs or organizations: Not on file    Relationship status: Not on file  . Intimate partner violence:    Fear of current or ex partner: Not on file    Emotionally abused: Not on file    Physically abused: Not on file    Forced sexual activity: Not on file  Other Topics Concern  . Not on file  Social History Narrative   Lives with  his wife and children. Fun: Play golf, sing, listen to music, watch sports.     Denies religious beliefs effecting health care.      Current Outpatient Medications:  .  amLODipine (NORVASC) 10 MG tablet, Take 1 tablet (10 mg total) by mouth daily. Must keep scheduled appt w/new provider for future refills, Disp: 90 tablet, Rfl: 2 .  blood glucose meter kit and supplies, Dispense based on patient and insurance preference. Check fasting blood sugar daily. Diagnosis: Diabetes E11.9, Disp: 1 each, Rfl: 0 .  glucose blood (FREESTYLE LITE) test strip, Use to check blood sugars twice a day. Dx E11.9, Disp: 100 each, Rfl: 3 .  glucose monitoring kit (FREESTYLE) monitoring kit, Use to check blood sugars Dx E11.9, Disp: 1 each, Rfl: 0 .  hydrochlorothiazide (HYDRODIURIL) 25 MG tablet, Take 1 tablet (25 mg total) by mouth daily. Must keep  scheduled appt w/new provider for future refills, Disp: 90 tablet, Rfl: 2 .  Lancets (FREESTYLE) lancets, Use to check blood sugars twice a day Dx E11.9, Disp: 100 each, Rfl: 3 .  metFORMIN (GLUCOPHAGE-XR) 500 MG 24 hr tablet, TAKE 2 TABLETS BY MOUTH DAILY WITH BREAKFAST, Disp: 180 tablet, Rfl: 3 .  quinapril (ACCUPRIL) 40 MG tablet, Take 1 tablet (40 mg total) by mouth at bedtime., Disp: 90 tablet, Rfl: 2 .  repaglinide (PRANDIN) 1 MG tablet, TAKE 1 TABLET (1 MG TOTAL) BY MOUTH 3 (THREE) TIMES DAILY BEFORE MEALS., Disp: 90 tablet, Rfl: 1 .  sildenafil (VIAGRA) 100 MG tablet, Take 0.5-1 tablets (50-100 mg total) by mouth daily as needed for erectile dysfunction., Disp: 5 tablet, Rfl: 11  No Known Allergies   ROS See HPI  Objective  Vitals:   04/10/18 1527  BP: 126/70  Pulse: 95  Resp: 16  Temp: 98.6 F (37 C)  TempSrc: Oral  SpO2: 96%  Weight: 273 lb 1.9 oz (123.9 kg)  Height: 5' 11"  (1.803 m)    Body mass index is 38.09 kg/m.  Physical Exam Vital signs reviewed. Constitutional: Patient appears well-developed and well-nourished. No distress.  HENT: Head: Normocephalic and atraumatic. Nose: Nose normal. Mouth/Throat: Oropharynx is clear and moist. Eyes: Conjunctivae normal. Pupils are equal, round, and reactive to light. No scleral icterus.  Neck: Normal range of motion. Neck supple. Cardiovascular: Normal rate, regular rhythm and normal heart sounds. No murmur heard.No BLE edema. Distal pulses intact. Pulmonary/Chest: Effort normal and breath sounds normal. No respiratory distress. Musculoskeletal: Normal range of motion, no joint effusions. No gross deformities Neurological:Heis alert and oriented to person, place, and time. Coordination, balance, strength, speech and gait are normal.  Skin: Skin is warm and dry. No rash noted. No erythema. Psychiatric: Patient has a normal mood and affect. behavior is normal. Judgment and thought content normal.   Assessment &  Plan RTC in 2 months for F/U: DM; CPE

## 2018-04-10 NOTE — Assessment & Plan Note (Signed)
Urology referral has been placed and updated today F/u with urology for further evaluation of ED and low testosterone

## 2018-04-10 NOTE — Assessment & Plan Note (Signed)
Stable, continue metformin. Continue to monitor blood glucose readings at home F/U in about 2 months to update A1c

## 2018-04-10 NOTE — Patient Instructions (Addendum)
Please continue to monitor your blood sugars and notify me if you are having readings over 150, under 90.  I have placed a referral to urology. Our office will call you to schedule this appointment. You should hear from our office in 7-10 days.  Please return in about 2 months for diabetes follow up- we will recheck your A1c and we can do your annual physical if you would like.  It was good to see you. Thanks for letting me take care of you today :)

## 2018-05-09 DIAGNOSIS — H5213 Myopia, bilateral: Secondary | ICD-10-CM | POA: Diagnosis not present

## 2018-05-09 DIAGNOSIS — E119 Type 2 diabetes mellitus without complications: Secondary | ICD-10-CM | POA: Diagnosis not present

## 2018-05-09 LAB — HM DIABETES EYE EXAM

## 2018-05-11 ENCOUNTER — Encounter: Payer: Self-pay | Admitting: Nurse Practitioner

## 2018-06-16 ENCOUNTER — Ambulatory Visit: Payer: BLUE CROSS/BLUE SHIELD | Admitting: Nurse Practitioner

## 2018-06-16 ENCOUNTER — Encounter: Payer: Self-pay | Admitting: Nurse Practitioner

## 2018-06-16 VITALS — BP 126/74 | HR 89 | Temp 98.2°F | Resp 16 | Ht 71.0 in | Wt 281.0 lb

## 2018-06-16 DIAGNOSIS — L309 Dermatitis, unspecified: Secondary | ICD-10-CM | POA: Insufficient documentation

## 2018-06-16 DIAGNOSIS — E119 Type 2 diabetes mellitus without complications: Secondary | ICD-10-CM

## 2018-06-16 DIAGNOSIS — R0683 Snoring: Secondary | ICD-10-CM

## 2018-06-16 LAB — POCT GLYCOSYLATED HEMOGLOBIN (HGB A1C): Hemoglobin A1C: 7.3 % — AB (ref 4.0–5.6)

## 2018-06-16 MED ORDER — METFORMIN HCL ER 500 MG PO TB24: ORAL_TABLET | ORAL | 3 refills | Status: DC

## 2018-06-16 MED ORDER — TRIAMCINOLONE ACETONIDE 0.5 % EX OINT: 1.0000 "application " | TOPICAL_OINTMENT | Freq: Two times a day (BID) | CUTANEOUS | 0 refills | Status: DC

## 2018-06-16 NOTE — Progress Notes (Signed)
Name: Chris Watkins   MRN: [id number]    DOB: 18-Jul-1971   Date:06/16/2018       Progress Note  Subjective  Chief Complaint  Chief Complaint  Patient presents with  . Follow-up    requesting triamcinolone cream for eczema and follow up on A1c    HPI He is here today for diabetes follow up and requesting refill of triamcinolone. He would also like to discuss sleep apnea testing  Diabetes- he is currently maintained on metformin 577m BID. He was also on prandin, but this was discontinued due to low glucose readings at his last OV on 02/24/18. Reports he has continued the metformin as instructed. Reports recent glucose readings between 110s-120s, feels his readings have been well controlled on metformin alone. Denies tremor, diaphoresis, polyuria, polydipsia, polyphagia.  Lab Results  Component Value Date   HGBA1C 7.3 (A) 06/16/2018   Eczema- He reports past history of eczema and has been prescribed  triaminolone  0.5 but ran out He would like refill today. He describes eczema as dry irritated patches to bilateral hands Denies fevers, drainage, erythema.  Sleep apnea- Denies past history of sleep apnea. He is requesting testing due to wife telling him that he snores and sounds like he is choking at night He also says he never feels like he sleeps well at night, wakes up very easily feeling like he is not rested. Denies weakness, syncope, headaches, shortness of breath.   Patient Active Problem List   Diagnosis Date Noted  . Erectile dysfunction 02/25/2018  . Type 2 diabetes mellitus ([place] 04/23/2015  . Routine general medical examination at a health care facility 04/23/2015  . HTN (hypertension) 04/09/2014    Past Surgical History:  Procedure Laterality Date  . HERNIA REPAIR    . SLIPPED CAPITAL FEMORAL EPIPHYSIS PINNING      Family History  Problem Relation Age of Onset  . Thyroid disease Mother   . Hypertension Father   . Diabetes Father   . Diabetes Maternal  Grandmother     Social History   Socioeconomic History  . Marital status: Married    Spouse name: Not on file  . Number of children: 3  . Years of education: 141 . Highest education level: Not on file  Occupational History  . Occupation: LFacilities manager Social Needs  . Financial resource strain: Not on file  . Food insecurity:    Worry: Not on file    Inability: Not on file  . Transportation needs:    Medical: Not on file    Non-medical: Not on file  Tobacco Use  . Smoking status: Current Every Day Smoker    Packs/day: 0.50    Types: Cigarettes  . Smokeless tobacco: Never Used  Substance and Sexual Activity  . Alcohol use: Yes    Comment: Has not since he was diagnosed with DM in 01/2015  . Drug use: No  . Sexual activity: Not on file  Lifestyle  . Physical activity:    Days per week: Not on file    Minutes per session: Not on file  . Stress: Not on file  Relationships  . Social connections:    Talks on phone: Not on file    Gets together: Not on file    Attends religious service: Not on file    Active member of club or organization: Not on file    Attends meetings of clubs or organizations: Not on file    Relationship status: Not  on file  . Intimate partner violence:    Fear of current or ex partner: Not on file    Emotionally abused: Not on file    Physically abused: Not on file    Forced sexual activity: Not on file  Other Topics Concern  . Not on file  Social History Narrative   Lives with his wife and children. Fun: Play golf, sing, listen to music, watch sports.     Denies religious beliefs effecting health care.      Current Outpatient Medications:  .  amLODipine (NORVASC) 10 MG tablet, Take 1 tablet (10 mg total) by mouth daily. Must keep scheduled appt w/new provider for future refills, Disp: 90 tablet, Rfl: 2 .  blood glucose meter kit and supplies, Dispense based on patient and insurance preference. Check fasting blood sugar daily. Diagnosis:  Diabetes E11.9, Disp: 1 each, Rfl: 0 .  glucose blood (FREESTYLE LITE) test strip, Use to check blood sugars twice a day. Dx E11.9, Disp: 100 each, Rfl: 3 .  glucose monitoring kit (FREESTYLE) monitoring kit, Use to check blood sugars Dx E11.9, Disp: 1 each, Rfl: 0 .  hydrochlorothiazide (HYDRODIURIL) 25 MG tablet, Take 1 tablet (25 mg total) by mouth daily. Must keep scheduled appt w/new provider for future refills, Disp: 90 tablet, Rfl: 2 .  Lancets (FREESTYLE) lancets, Use to check blood sugars twice a day Dx E11.9, Disp: 100 each, Rfl: 3 .  metFORMIN (GLUCOPHAGE-XR) 500 MG 24 hr tablet, TAKE 2 TABLETS BY MOUTH DAILY WITH BREAKFAST, Disp: 180 tablet, Rfl: 3 .  quinapril (ACCUPRIL) 40 MG tablet, Take 1 tablet (40 mg total) by mouth at bedtime., Disp: 90 tablet, Rfl: 2  No Known Allergies   ROS See HPI  Objective  Vitals:   06/16/18 1556  BP: 126/74  Pulse: 89  Resp: 16  Temp: 98.2 F (36.8 C)  TempSrc: Oral  SpO2: 97%  Weight: 281 lb (127.5 kg)  Height: _0  (1.803 m)    Body mass index is 39.19 kg/m.  Physical Exam Vital signs reviewed. Constitutional: Patient appears well-developed and well-nourished. No distress.  HENT: Head: Normocephalic and atraumatic. Nose: Nose normal. Mouth/Throat: Oropharynx is clear and moist. Eyes: Conjunctivae normal. Pupils are equal, round, and reactive to light. No scleral icterus.  Neck: Normal range of motion. Neck supple. Cardiovascular: Normal rate, regular rhythm and normal heart sounds. No murmur heard.No BLE edema. Distal pulses intact. Pulmonary/Chest: Effort normal and breath sounds normal. No respiratory distress. Neurological:Heis alert and oriented to person, place, and time. Coordination, balance, strength, speech and gait are normal.  Skin: Skin is warm and dry. No rash noted. No erythema. Dry scaly plaques to bilateral hands. Psychiatric: Patient has a normal mood and affect. behavior is normal. Judgment and thought  content normal.   Recent Results (from the past 2160 hour(s))  HM DIABETES EYE EXAM     Status: None   Collection Time: 05/09/18 12:00 AM  Result Value Ref Range   HM Diabetic Eye Exam No Retinopathy No Retinopathy     Assessment & Plan  RTC in 3 months for F/U: CPE with fasting labs, DM F/U- recheck A1c   Snoring He is requesting evaluation of sleep apnea due to snoring and feeling like he does not sleep well, referral to pulmonology placed - Ambulatory referral to Pulmonology

## 2018-06-16 NOTE — Assessment & Plan Note (Signed)
A1c slightly elevated- will increase metformin dosage to 1000 in am and 500 in PM RTC in 3 months for repeat A1c, sooner for readings <80, >200 at home - POCT HgB A1C-7.3 - metFORMIN (GLUCOPHAGE-XR) 500 MG 24 hr tablet; TAKE 2 TABLETS BY MOUTH DAILY WITH BREAKFAST AND 1 TABLET IN THE EVENING  Dispense: 180 tablet; Refill: 3

## 2018-06-16 NOTE — Assessment & Plan Note (Signed)
Kenalog refill sent F/u For new, worsening symptoms or if no improvement with kenalog - triamcinolone ointment (KENALOG) 0.5 %; Apply 1 application topically 2 (two) times daily.  Dispense: 30 g; Refill: 0

## 2018-06-16 NOTE — Patient Instructions (Addendum)
Please increase your metformin to 1000mg  in the morning and 500 mg in the evening. Please Please record your blood sugars in a log twice daily, once in the morning when you first wake up up before eating anything and then again 2 hours after dinner or before bedtime. Please follow up for readings <80 or >200.  I have sent a refill of  Triamcinolonoe.  I have placed a referral to pulmonology for sleep study. Our office will call you to schedule this appointment. You should hear from our office in 7-10 days.  Please return in about 3 months for annual physical with fasting labs, we will recheck your A1c that day.

## 2018-08-14 ENCOUNTER — Institutional Professional Consult (permissible substitution): Payer: BLUE CROSS/BLUE SHIELD | Admitting: Pulmonary Disease

## 2018-08-24 ENCOUNTER — Ambulatory Visit: Payer: Self-pay | Admitting: *Deleted

## 2018-08-24 NOTE — Telephone Encounter (Signed)
Patient called requesting to change his diabetic medication as his blood sugars have been running high for the last 2 weeks. Today 300, 2 days ago 270, earlier last week 350. Denies any symptoms except for increased voiding. He is taking metformin as prescribed. Made appointment according to notes in [facility] on 06/16/18 for sugars >200. Appointment tomorrow.   Reason for Disposition . [1] Caller has NON-URGENT medication or insulin pump question AND [2] triager unable to answer question  Protocols used: DIABETES - HIGH BLOOD SUGAR-A-AH

## 2018-08-25 ENCOUNTER — Encounter: Payer: Self-pay | Admitting: Nurse Practitioner

## 2018-08-25 ENCOUNTER — Ambulatory Visit (INDEPENDENT_AMBULATORY_CARE_PROVIDER_SITE_OTHER): Payer: BLUE CROSS/BLUE SHIELD | Admitting: Pulmonary Disease

## 2018-08-25 ENCOUNTER — Ambulatory Visit: Payer: BLUE CROSS/BLUE SHIELD | Admitting: Nurse Practitioner

## 2018-08-25 ENCOUNTER — Encounter: Payer: Self-pay | Admitting: Pulmonary Disease

## 2018-08-25 VITALS — BP 148/72 | HR 99 | Ht 71.0 in | Wt 273.0 lb

## 2018-08-25 VITALS — BP 144/70 | HR 94 | Ht 71.0 in | Wt 274.6 lb

## 2018-08-25 DIAGNOSIS — L309 Dermatitis, unspecified: Secondary | ICD-10-CM | POA: Diagnosis not present

## 2018-08-25 DIAGNOSIS — G4733 Obstructive sleep apnea (adult) (pediatric): Secondary | ICD-10-CM

## 2018-08-25 DIAGNOSIS — E119 Type 2 diabetes mellitus without complications: Secondary | ICD-10-CM | POA: Diagnosis not present

## 2018-08-25 MED ORDER — EMPAGLIFLOZIN 10 MG PO TABS: 10.0000 mg | ORAL_TABLET | Freq: Every day | ORAL | 1 refills | Status: DC

## 2018-08-25 MED ORDER — GLUCOSE BLOOD VI STRP: ORAL_STRIP | 3 refills | Status: AC

## 2018-08-25 NOTE — Patient Instructions (Addendum)
Continue metformin  START jardiance 10mg  once daily.  Keep your follow up visit for next month so I can see how you are doing   Diabetes Mellitus and Nutrition When you have diabetes (diabetes mellitus), it is very important to have healthy eating habits because your blood sugar (glucose) levels are greatly affected by what you eat and drink. Eating healthy foods in the appropriate amounts, at about the same times every day, can help you:  Control your blood glucose.  Lower your risk of heart disease.  Improve your blood pressure.  Reach or maintain a healthy weight.  Every person with diabetes is different, and each person has different needs for a meal plan. Your health care provider may recommend that you work with a diet and nutrition specialist (dietitian) to make a meal plan that is best for you. Your meal plan may vary depending on factors such as:  The calories you need.  The medicines you take.  Your weight.  Your blood glucose, blood pressure, and cholesterol levels.  Your activity level.  Other health conditions you have, such as heart or kidney disease.  How do carbohydrates affect me? Carbohydrates affect your blood glucose level more than any other type of food. Eating carbohydrates naturally increases the amount of glucose in your blood. Carbohydrate counting is a method for keeping track of how many carbohydrates you eat. Counting carbohydrates is important to keep your blood glucose at a healthy level, especially if you use insulin or take certain oral diabetes medicines. It is important to know how many carbohydrates you can safely have in each meal. This is different for every person. Your dietitian can help you calculate how many carbohydrates you should have at each meal and for snack. Foods that contain carbohydrates include:  Bread, cereal, rice, pasta, and crackers.  Potatoes and corn.  Peas, beans, and lentils.  Milk and yogurt.  Fruit and  juice.  Desserts, such as cakes, cookies, ice cream, and candy.  How does alcohol affect me? Alcohol can cause a sudden decrease in blood glucose (hypoglycemia), especially if you use insulin or take certain oral diabetes medicines. Hypoglycemia can be a life-threatening condition. Symptoms of hypoglycemia (sleepiness, dizziness, and confusion) are similar to symptoms of having too much alcohol. If your health care provider says that alcohol is safe for you, follow these guidelines:  Limit alcohol intake to no more than 1 drink per day for nonpregnant women and 2 drinks per day for men. One drink equals 12 oz of beer, 5 oz of wine, or 1 oz of hard liquor.  Do not drink on an empty stomach.  Keep yourself hydrated with water, diet soda, or unsweetened iced tea.  Keep in mind that regular soda, juice, and other mixers may contain a lot of sugar and must be counted as carbohydrates.  What are tips for following this plan? Reading food labels  Start by checking the serving size on the label. The amount of calories, carbohydrates, fats, and other nutrients listed on the label are based on one serving of the food. Many foods contain more than one serving per package.  Check the total grams (g) of carbohydrates in one serving. You can calculate the number of servings of carbohydrates in one serving by dividing the total carbohydrates by 15. For example, if a food has 30 g of total carbohydrates, it would be equal to 2 servings of carbohydrates.  Check the number of grams (g) of saturated and trans fats in  one serving. Choose foods that have low or no amount of these fats.  Check the number of milligrams (mg) of sodium in one serving. Most people should limit total sodium intake to less than 2,300 mg per day.  Always check the nutrition information of foods labeled as "low-fat" or "nonfat". These foods may be higher in added sugar or refined carbohydrates and should be avoided.  Talk to your  dietitian to identify your daily goals for nutrients listed on the label. Shopping  Avoid buying canned, premade, or processed foods. These foods tend to be high in fat, sodium, and added sugar.  Shop around the outside edge of the grocery store. This includes fresh fruits and vegetables, bulk grains, fresh meats, and fresh dairy. Cooking  Use low-heat cooking methods, such as baking, instead of high-heat cooking methods like deep frying.  Cook using healthy oils, such as olive, canola, or sunflower oil.  Avoid cooking with butter, cream, or high-fat meats. Meal planning  Eat meals and snacks regularly, preferably at the same times every day. Avoid going long periods of time without eating.  Eat foods high in fiber, such as fresh fruits, vegetables, beans, and whole grains. Talk to your dietitian about how many servings of carbohydrates you can eat at each meal.  Eat 4-6 ounces of lean protein each day, such as lean meat, chicken, fish, eggs, or tofu. 1 ounce is equal to 1 ounce of meat, chicken, or fish, 1 egg, or 1/4 cup of tofu.  Eat some foods each day that contain healthy fats, such as avocado, nuts, seeds, and fish. Lifestyle   Check your blood glucose regularly.  Exercise at least 30 minutes 5 or more days each week, or as told by your health care provider.  Take medicines as told by your health care provider.  Do not use any products that contain nicotine or tobacco, such as cigarettes and e-cigarettes. If you need help quitting, ask your health care provider.  Work with a Social worker or diabetes educator to identify strategies to manage stress and any emotional and social challenges. What are some questions to ask my health care provider?  Do I need to meet with a diabetes educator?  Do I need to meet with a dietitian?  What number can I call if I have questions?  When are the best times to check my blood glucose? Where to find more information:  American Diabetes  Association: diabetes.org/food-and-fitness/food  Academy of Nutrition and Dietetics: PokerClues.dk  [employer] of Diabetes and Digestive and Kidney Diseases (NIH): ContactWire.be Summary  A healthy meal plan will help you control your blood glucose and maintain a healthy lifestyle.  Working with a diet and nutrition specialist (dietitian) can help you make a meal plan that is best for you.  Keep in mind that carbohydrates and alcohol have immediate effects on your blood glucose levels. It is important to count carbohydrates and to use alcohol carefully. This information is not intended to replace advice given to you by your health care provider. Make sure you discuss any questions you have with your health care provider. Document Released: 09/09/2005 Document Revised: 01/17/2017 Document Reviewed: 01/17/2017 Elsevier Interactive Patient Education  [employer].

## 2018-08-25 NOTE — Patient Instructions (Signed)
High probability of obstructive sleep apnea  Obesity--losing weight  Will order home sleep study  I will see you back in 6 to 8 weeks following initiation of treatment

## 2018-08-25 NOTE — Progress Notes (Signed)
Chris Watkins    [id number]    November 21, 1971  Primary Care Physician:Shambley, Delphia Grates, NP  Referring Physician: Lance Sell, NP [address] [place], [place] [id number]  Chief complaint:   Daytime sleepiness  HPI:  History of significant snoring, witnessed apneas, History of significant alcohol use in the past quit about a year ago Quit smoking in March Wife is noted significant snoring, witnessed apneas, gasping respirations at night Denies a chronic headaches No significant dryness of his throat in the mornings He is tired during the day, occasionally nods off Denies any chest pains or chest discomfort  Usually goes to bed about 10 PM, wakes up at 5:30 in the morning Able to fall asleep very easily He has lost about 30 pounds recently   Exposures: No significant exposure Smoking history: He recently quit smoking Travel history: No significant recent travel Relevant family history: Denies any family history of sleep apnea  Outpatient Encounter Medications as of 08/25/2018  Medication Sig  . amLODipine (NORVASC) 10 MG tablet Take 1 tablet (10 mg total) by mouth daily. Must keep scheduled appt w/new provider for future refills  . blood glucose meter kit and supplies Dispense based on patient and insurance preference. Check fasting blood sugar daily. Diagnosis: Diabetes E11.9  . empagliflozin (JARDIANCE) 10 MG TABS tablet Take 10 mg by mouth daily.  Marland Kitchen glucose blood (FREESTYLE LITE) test strip Use to check blood sugars twice a day. Dx E11.9  . glucose monitoring kit (FREESTYLE) monitoring kit Use to check blood sugars Dx E11.9  . hydrochlorothiazide (HYDRODIURIL) 25 MG tablet Take 1 tablet (25 mg total) by mouth daily. Must keep scheduled appt w/new provider for future refills  . Lancets (FREESTYLE) lancets Use to check blood sugars twice a day Dx E11.9  . metFORMIN (GLUCOPHAGE-XR) 500 MG 24 hr tablet TAKE 2 TABLETS BY MOUTH DAILY WITH  BREAKFAST AND 1 TABLET IN THE EVENING  . quinapril (ACCUPRIL) 40 MG tablet Take 1 tablet (40 mg total) by mouth at bedtime.  . triamcinolone ointment (KENALOG) 0.5 % Apply 1 application topically 2 (two) times daily.   No facility-administered encounter medications on file as of 08/25/2018.     Allergies as of 08/25/2018  . (No Known Allergies)    Past Medical History:  Diagnosis Date  . Chicken pox   . Diabetes mellitus without complication ([place])   . Hypertension     Past Surgical History:  Procedure Laterality Date  . HERNIA REPAIR    . SLIPPED CAPITAL FEMORAL EPIPHYSIS PINNING      Family History  Problem Relation Age of Onset  . Thyroid disease Mother   . Hypertension Father   . Diabetes Father   . Diabetes Maternal Grandmother     Social History   Socioeconomic History  . Marital status: Married    Spouse name: Not on file  . Number of children: 3  . Years of education: 55  . Highest education level: Not on file  Occupational History  . Occupation: Facilities manager  Social Needs  . Financial resource strain: Not on file  . Food insecurity:    Worry: Not on file    Inability: Not on file  . Transportation needs:    Medical: Not on file    Non-medical: Not on file  Tobacco Use  . Smoking status: Former Smoker    Packs/day: 0.50    Types: Cigarettes    Last  attempt to quit: 02/27/2018    Years since quitting: 0.4  . Smokeless tobacco: Never Used  Substance and Sexual Activity  . Alcohol use: Yes    Comment: Has not since he was diagnosed with DM in 01/2015  . Drug use: No  . Sexual activity: Not on file  Lifestyle  . Physical activity:    Days per week: Not on file    Minutes per session: Not on file  . Stress: Not on file  Relationships  . Social connections:    Talks on phone: Not on file    Gets together: Not on file    Attends religious service: Not on file    Active member of club or organization: Not on file    Attends meetings of clubs  or organizations: Not on file    Relationship status: Not on file  . Intimate partner violence:    Fear of current or ex partner: Not on file    Emotionally abused: Not on file    Physically abused: Not on file    Forced sexual activity: Not on file  Other Topics Concern  . Not on file  Social History Narrative   Lives with his wife and children. Fun: Play golf, sing, listen to music, watch sports.     Denies religious beliefs effecting health care.     Review of Systems  Constitutional: Negative.   HENT: Negative.   Respiratory: Positive for apnea.   Cardiovascular: Negative.   Musculoskeletal: Negative.   Psychiatric/Behavioral: Positive for sleep disturbance.    Vitals:   08/25/18 1433  BP: (!) 144/70  Pulse: 94  SpO2: 97%     Physical Exam  Constitutional: He is oriented to person, place, and time. He appears well-developed and well-nourished.  HENT:  Head: Normocephalic and atraumatic.  Eyes: Pupils are equal, round, and reactive to light. Conjunctivae and EOM are normal. Right eye exhibits no discharge. Left eye exhibits no discharge.  Neck: Normal range of motion. Neck supple. No tracheal deviation present. No thyromegaly present.  Cardiovascular: Normal rate and regular rhythm.  Pulmonary/Chest: Effort normal and breath sounds normal. No respiratory distress. He has no wheezes.  Abdominal: Soft. He exhibits no distension. There is no tenderness.  Musculoskeletal: Normal range of motion. He exhibits no edema or deformity.  Neurological: He is alert and oriented to person, place, and time. He has normal reflexes. No cranial nerve deficit. Coordination normal.  Skin: Skin is warm and dry. No erythema.  Psychiatric: He has a normal mood and affect.   Assessment:  High probability of significant obstructive sleep apnea  Obesity  Excessive daytime sleepiness  Epworth Sleepiness Scale of 11  Plan/Recommendations: We will schedule a home sleep  study  Encouraged to continue efforts at weight loss  Pathophysiology of significant sleep disordered breathing discussed with patient Options of treatment discussed with patient  I will see you back in the office 6 to 8 weeks following initiation of treatment   Sherrilyn Rist MD [facility] 08/25/2018, 2:52 PM  CC: Lance Sell, NP

## 2018-08-25 NOTE — Assessment & Plan Note (Signed)
Due to elevated glucose readings will add jardiance,continue metformin Continue to monitor blood sugar readings at home Discussed the role of healthy diet and exercise in the management of DM and printed additional information on AVS RTC in 1 month for F/U, or sooner for persistently elevated glucose readings - glucose blood (FREESTYLE LITE) test strip; Use to check blood sugars twice a day. Dx E11.9  Dispense: 100 each; Refill: 3 - empagliflozin (JARDIANCE) 10 MG TABS tablet; Take 10 mg by mouth daily.  Dispense: 30 tablet; Refill: 1

## 2018-08-25 NOTE — Assessment & Plan Note (Signed)
Discussed referral to dermatology for further evaluation of eczema unrelieved by kenalog aND HE IS AGREEABLE - Ambulatory referral to Dermatology

## 2018-08-25 NOTE — Progress Notes (Signed)
Name: Chris Watkins   MRN: [id number]    DOB: 12/05/1971   Date:08/25/2018       Progress Note  Subjective  Chief Complaint High blood sugar, skin problem  HPI Chris Watkins is here today requesting evaluation of elevated blood sugar and skin problem.  Diabetes- maintained on metformin XR 1000 q am, 500 q pm Reports daily medication compliance without adverse medication effects. Reports over the past month hes noticed high fasting am readings- 200-350s, despite taking his metformin daily. He does admit hes not been watching his diet as much, eating more fruits. He denies low readings, tremor, diaphoresis. He has experienced some polyuria and polydipsia since his sugars have been elevated Denies new medications or recent steroid use  Lab Results  Component Value Date   HGBA1C 7.3 (A) 06/16/2018   Skin problem-hx eczema, describes as dry itchy skin to various parts of body Has experienced eczema since he was a child  He has been applying kenalog to bilateral wrists for recent worsening of eczema without improvement and wonders if there is something stronger to try    Patient Active Problem List   Diagnosis Date Noted  . Eczema 06/16/2018  . Erectile dysfunction 02/25/2018  . Type 2 diabetes mellitus ([place]) 04/23/2015  . Routine general medical examination at a health care facility 04/23/2015  . HTN (hypertension) 04/09/2014    Past Surgical History:  Procedure Laterality Date  . HERNIA REPAIR    . SLIPPED CAPITAL FEMORAL EPIPHYSIS PINNING      Family History  Problem Relation Age of Onset  . Thyroid disease Mother   . Hypertension Father   . Diabetes Father   . Diabetes Maternal Grandmother     Social History   Socioeconomic History  . Marital status: Married    Spouse name: Not on file  . Number of children: 3  . Years of education: 38  . Highest education level: Not on file  Occupational History  . Occupation: Facilities manager  Social Needs  . Financial  resource strain: Not on file  . Food insecurity:    Worry: Not on file    Inability: Not on file  . Transportation needs:    Medical: Not on file    Non-medical: Not on file  Tobacco Use  . Smoking status: Current Every Day Smoker    Packs/day: 0.50    Types: Cigarettes  . Smokeless tobacco: Never Used  Substance and Sexual Activity  . Alcohol use: Yes    Comment: Has not since he was diagnosed with DM in 01/2015  . Drug use: No  . Sexual activity: Not on file  Lifestyle  . Physical activity:    Days per week: Not on file    Minutes per session: Not on file  . Stress: Not on file  Relationships  . Social connections:    Talks on phone: Not on file    Gets together: Not on file    Attends religious service: Not on file    Active member of club or organization: Not on file    Attends meetings of clubs or organizations: Not on file    Relationship status: Not on file  . Intimate partner violence:    Fear of current or ex partner: Not on file    Emotionally abused: Not on file    Physically abused: Not on file    Forced sexual activity: Not on file  Other Topics Concern  . Not on file  Social  History Narrative   Lives with his wife and children. Fun: Play golf, sing, listen to music, watch sports.     Denies religious beliefs effecting health care.      Current Outpatient Medications:  .  amLODipine (NORVASC) 10 MG tablet, Take 1 tablet (10 mg total) by mouth daily. Must keep scheduled appt w/new provider for future refills, Disp: 90 tablet, Rfl: 2 .  blood glucose meter kit and supplies, Dispense based on patient and insurance preference. Check fasting blood sugar daily. Diagnosis: Diabetes E11.9, Disp: 1 each, Rfl: 0 .  glucose blood (FREESTYLE LITE) test strip, Use to check blood sugars twice a day. Dx E11.9, Disp: 100 each, Rfl: 3 .  glucose monitoring kit (FREESTYLE) monitoring kit, Use to check blood sugars Dx E11.9, Disp: 1 each, Rfl: 0 .  hydrochlorothiazide  (HYDRODIURIL) 25 MG tablet, Take 1 tablet (25 mg total) by mouth daily. Must keep scheduled appt w/new provider for future refills, Disp: 90 tablet, Rfl: 2 .  Lancets (FREESTYLE) lancets, Use to check blood sugars twice a day Dx E11.9, Disp: 100 each, Rfl: 3 .  metFORMIN (GLUCOPHAGE-XR) 500 MG 24 hr tablet, TAKE 2 TABLETS BY MOUTH DAILY WITH BREAKFAST AND 1 TABLET IN THE EVENING, Disp: 180 tablet, Rfl: 3 .  quinapril (ACCUPRIL) 40 MG tablet, Take 1 tablet (40 mg total) by mouth at bedtime., Disp: 90 tablet, Rfl: 2 .  triamcinolone ointment (KENALOG) 0.5 %, Apply 1 application topically 2 (two) times daily., Disp: 30 g, Rfl: 0  No Known Allergies   ROS See HPI  Objective  Vitals:   08/25/18 1022  BP: (!) 148/72  Pulse: 99  SpO2: 96%  Weight: 273 lb (123.8 kg)  Height: 5' 11"  (1.803 m)    Body mass index is 38.08 kg/m.  Physical Exam Vital signs reviewed. Constitutional: Patient appears well-developed and well-nourished. No distress.  HENT: Head: Normocephalic and atraumatic.  Nose: Nose normal. Mouth/Throat: Oropharynx is clear and moist. No oropharyngeal exudate.  Eyes: Conjunctivae and EOM are normal. Pupils are equal, round, and reactive to light. No scleral icterus.  Neck: Normal range of motion. Neck supple.  Cardiovascular: Normal rate, regular rhythm and . Distal pulses intact. Pulmonary/Chest: Effort normal,  No respiratory distress. Neurological: he is alert and oriented to person, place, and time. No cranial nerve deficit. Coordination, balance, strength, speech and gait are normal.  Skin: Skin is warm and dry. Dry,  Scaly thickened skin to anterior wrists bilaterally Psychiatric: Patient has a normal mood and affect. behavior is normal. Judgment and thought content normal.   Assessment & Plan RTC in 1 month for F/U as scheduled

## 2018-09-04 DIAGNOSIS — N5201 Erectile dysfunction due to arterial insufficiency: Secondary | ICD-10-CM | POA: Diagnosis not present

## 2018-09-06 ENCOUNTER — Telehealth: Payer: Self-pay | Admitting: Pulmonary Disease

## 2018-09-06 NOTE — Telephone Encounter (Signed)
Patient was calling back to get scheduled for his home sleep test. Advised patient that one of the PCCs would call him back shortly.

## 2018-09-07 NOTE — Telephone Encounter (Signed)
Spoke to patient yesterday & scheduled HST 9/17.  Nothing further needed.

## 2018-09-15 ENCOUNTER — Other Ambulatory Visit: Payer: Self-pay | Admitting: *Deleted

## 2018-09-15 ENCOUNTER — Telehealth: Payer: Self-pay | Admitting: Nurse Practitioner

## 2018-09-15 DIAGNOSIS — E119 Type 2 diabetes mellitus without complications: Secondary | ICD-10-CM

## 2018-09-15 MED ORDER — METFORMIN HCL ER 500 MG PO TB24: ORAL_TABLET | ORAL | 1 refills | Status: DC

## 2018-09-15 NOTE — Telephone Encounter (Signed)
Copied from [place] [id number]. Topic: Quick Communication - Rx Refill/Question >> Sep 15, 2018 11:17 AM Chris Watkins wrote: Medication: metFORMIN (GLUCOPHAGE-XR) 500 MG 24 hr tablet [[id number]] ; pt states the dosage needs to be changed; contact to advise  Has the patient contacted their pharmacy? Yes.   (Agent: If no, request that the patient contact the pharmacy for the refill.) (Agent: If yes, when and what did the pharmacy advise?)  Preferred Pharmacy (with phone number or street name): [facility]  Agent: Please be advised that RX refills may take up to 3 business days. We ask that you follow-up with your pharmacy.

## 2018-09-15 NOTE — Telephone Encounter (Signed)
I called pt- a new rx was sent in this morning for the updated sig. See meds. Pt informed.

## 2018-09-15 NOTE — Telephone Encounter (Signed)
Left message for pt to return call to the office to discuss what dosage Metformin needed to be changed to and the reason for changing the dosage.

## 2018-09-18 DIAGNOSIS — G4733 Obstructive sleep apnea (adult) (pediatric): Secondary | ICD-10-CM | POA: Diagnosis not present

## 2018-09-19 DIAGNOSIS — G4733 Obstructive sleep apnea (adult) (pediatric): Secondary | ICD-10-CM

## 2018-09-20 ENCOUNTER — Other Ambulatory Visit: Payer: Self-pay | Admitting: *Deleted

## 2018-09-20 DIAGNOSIS — G4733 Obstructive sleep apnea (adult) (pediatric): Secondary | ICD-10-CM

## 2018-09-22 ENCOUNTER — Telehealth: Payer: Self-pay | Admitting: Pulmonary Disease

## 2018-09-22 ENCOUNTER — Encounter: Payer: Self-pay | Admitting: Nurse Practitioner

## 2018-09-22 ENCOUNTER — Ambulatory Visit: Payer: BLUE CROSS/BLUE SHIELD | Admitting: Nurse Practitioner

## 2018-09-22 ENCOUNTER — Other Ambulatory Visit (INDEPENDENT_AMBULATORY_CARE_PROVIDER_SITE_OTHER): Payer: BLUE CROSS/BLUE SHIELD

## 2018-09-22 VITALS — BP 120/72 | HR 90 | Ht 71.0 in | Wt 267.0 lb

## 2018-09-22 DIAGNOSIS — Z114 Encounter for screening for human immunodeficiency virus [HIV]: Secondary | ICD-10-CM | POA: Diagnosis not present

## 2018-09-22 DIAGNOSIS — E119 Type 2 diabetes mellitus without complications: Secondary | ICD-10-CM | POA: Diagnosis not present

## 2018-09-22 DIAGNOSIS — Z0000 Encounter for general adult medical examination without abnormal findings: Secondary | ICD-10-CM | POA: Diagnosis not present

## 2018-09-22 DIAGNOSIS — G4733 Obstructive sleep apnea (adult) (pediatric): Secondary | ICD-10-CM

## 2018-09-22 DIAGNOSIS — Z13220 Encounter for screening for lipoid disorders: Secondary | ICD-10-CM | POA: Diagnosis not present

## 2018-09-22 DIAGNOSIS — I10 Essential (primary) hypertension: Secondary | ICD-10-CM

## 2018-09-22 LAB — COMPREHENSIVE METABOLIC PANEL
ALT: 23 U/L (ref 0–53)
AST: 26 U/L (ref 0–37)
Albumin: 4.3 g/dL (ref 3.5–5.2)
Alkaline Phosphatase: 81 U/L (ref 39–117)
BILIRUBIN TOTAL: 0.5 mg/dL (ref 0.2–1.2)
BUN: 14 mg/dL (ref 6–23)
CALCIUM: 9.3 mg/dL (ref 8.4–10.5)
CO2: 30 mEq/L (ref 19–32)
CREATININE: 0.74 mg/dL (ref 0.40–1.50)
Chloride: 98 mEq/L (ref 96–112)
GFR: 145.14 mL/min (ref >60.00)
GLUCOSE: 111 mg/dL — AB (ref 70–99)
Potassium: 3.4 mEq/L — ABNORMAL LOW (ref 3.5–5.1)
Sodium: 135 mEq/L (ref 135–145)
Total Protein: 7.7 g/dL (ref 6.0–8.3)

## 2018-09-22 LAB — MICROALBUMIN / CREATININE URINE RATIO
CREATININE, U: 42.9 mg/dL
Microalb Creat Ratio: 1.6 mg/g (ref 0.0–30.0)
Microalb, Ur: <0.7 mg/dL (ref 0.0–1.9)

## 2018-09-22 LAB — LIPID PANEL
CHOLESTEROL: 147 mg/dL (ref 0–200)
HDL: 26 mg/dL — AB (ref >39.00)
LDL Cholesterol: 104 mg/dL — ABNORMAL HIGH (ref 0–99)
NONHDL: 120.84
Total CHOL/HDL Ratio: 6
Triglycerides: 83 mg/dL (ref 0.0–149.0)
VLDL: 16.6 mg/dL (ref 0.0–40.0)

## 2018-09-22 LAB — CBC
HCT: 41.2 % (ref 39.0–52.0)
Hemoglobin: 13.8 g/dL (ref 13.0–17.0)
MCHC: 33.4 g/dL (ref 30.0–36.0)
MCV: 86.6 fl (ref 78.0–100.0)
Platelets: 184 10*3/uL (ref 150.0–400.0)
RBC: 4.76 Mil/uL (ref 4.22–5.81)
RDW: 14.7 % (ref 11.5–15.5)
WBC: 5.3 10*3/uL (ref 4.0–10.5)

## 2018-09-22 LAB — HEMOGLOBIN A1C: HEMOGLOBIN A1C: 9.7 % — AB (ref 4.6–6.5)

## 2018-09-22 MED ORDER — EMPAGLIFLOZIN 10 MG PO TABS: 10.0000 mg | ORAL_TABLET | Freq: Every day | ORAL | 2 refills | Status: DC

## 2018-09-22 NOTE — Patient Instructions (Signed)
Please head downstairs for lab work/x-rays. If any of your test results are critically abnormal, you will be contacted right away. Otherwise, I will contact you within a week about your test results and follow up recommendations  I have placed a referral to nutrition education. Our office will begin processing this referral. Please follow up if you have not heard anything about this referral within 10 days.  I will plan to see you back in 6 months for routine follow up, or sooner if needed   Health Maintenance, Male A healthy lifestyle and preventive care is important for your health and wellness. Ask your health care provider about what schedule of regular examinations is right for you. What should I know about weight and diet? Eat a Healthy Diet  Eat plenty of vegetables, fruits, whole grains, low-fat dairy products, and lean protein.  Do not eat a lot of foods high in solid fats, added sugars, or salt.  Maintain a Healthy Weight Regular exercise can help you achieve or maintain a healthy weight. You should:  Do at least 150 minutes of exercise each week. The exercise should increase your heart rate and make you sweat (moderate-intensity exercise).  Do strength-training exercises at least twice a week.  Watch Your Levels of Cholesterol and Blood Lipids  Have your blood tested for lipids and cholesterol every 5 years starting at 48 years of age. If you are at high risk for heart disease, you should start having your blood tested when you are 47 years old. You may need to have your cholesterol levels checked more often if: ? Your lipid or cholesterol levels are high. ? You are older than 48 years of age. ? You are at high risk for heart disease.  What should I know about cancer screening? Many types of cancers can be detected early and may often be prevented. Lung Cancer  You should be screened every year for lung cancer if: ? You are a current smoker who has smoked for at least  30 years. ? You are a former smoker who has quit within the past 15 years.  Talk to your health care provider about your screening options, when you should start screening, and how often you should be screened.  Colorectal Cancer  Routine colorectal cancer screening usually begins at 48 years of age and should be repeated every 5-10 years until you are 48 years old. You may need to be screened more often if early forms of precancerous polyps or small growths are found. Your health care provider may recommend screening at an earlier age if you have risk factors for colon cancer.  Your health care provider may recommend using home test kits to check for hidden blood in the stool.  A small camera at the end of a tube can be used to examine your colon (sigmoidoscopy or colonoscopy). This checks for the earliest forms of colorectal cancer.  Prostate and Testicular Cancer  Depending on your age and overall health, your health care provider may do certain tests to screen for prostate and testicular cancer.  Talk to your health care provider about any symptoms or concerns you have about testicular or prostate cancer.  Skin Cancer  Check your skin from head to toe regularly.  Tell your health care provider about any new moles or changes in moles, especially if: ? There is a change in a mole's size, shape, or color. ? You have a mole that is larger than a pencil eraser.  Always  use sunscreen. Apply sunscreen liberally and repeat throughout the day.  Protect yourself by wearing long sleeves, pants, a wide-brimmed hat, and sunglasses when outside.  What should I know about heart disease, diabetes, and high blood pressure?  If you are 71-9 years of age, have your blood pressure checked every 3-5 years. If you are 92 years of age or older, have your blood pressure checked every year. You should have your blood pressure measured twice-once when you are at a hospital or clinic, and once when you  are not at a hospital or clinic. Record the average of the two measurements. To check your blood pressure when you are not at a hospital or clinic, you can use: ? An automated blood pressure machine at a pharmacy. ? A home blood pressure monitor.  Talk to your health care provider about your target blood pressure.  If you are between 53-34 years old, ask your health care provider if you should take aspirin to prevent heart disease.  Have regular diabetes screenings by checking your fasting blood sugar level. ? If you are at a normal weight and have a low risk for diabetes, have this test once every three years after the age of 25. ? If you are overweight and have a high risk for diabetes, consider being tested at a younger age or more often.  A one-time screening for abdominal aortic aneurysm (AAA) by ultrasound is recommended for men aged 16-75 years who are current or former smokers. What should I know about preventing infection? Hepatitis B If you have a higher risk for hepatitis B, you should be screened for this virus. Talk with your health care provider to find out if you are at risk for hepatitis B infection. Hepatitis C Blood testing is recommended for:  Everyone born from [birth year] through 1966.  Anyone with known risk factors for hepatitis C.  Sexually Transmitted Diseases (STDs)  You should be screened each year for STDs including gonorrhea and chlamydia if: ? You are sexually active and are younger than 48 years of age. ? You are older than 48 years of age and your health care provider tells you that you are at risk for this type of infection. ? Your sexual activity has changed since you were last screened and you are at an increased risk for chlamydia or gonorrhea. Ask your health care provider if you are at risk.  Talk with your health care provider about whether you are at high risk of being infected with HIV. Your health care provider may recommend a prescription medicine to  help prevent HIV infection.  What else can I do?  Schedule regular health, dental, and eye exams.  Stay current with your vaccines (immunizations).  Do not use any tobacco products, such as cigarettes, chewing tobacco, and e-cigarettes. If you need help quitting, ask your health care provider.  Limit alcohol intake to no more than 2 drinks per day. One drink equals 12 ounces of beer, 5 ounces of wine, or 1 ounces of hard liquor.  Do not use street drugs.  Do not share needles.  Ask your health care provider for help if you need support or information about quitting drugs.  Tell your health care provider if you often feel depressed.  Tell your health care provider if you have ever been abused or do not feel safe at home. This information is not intended to replace advice given to you by your health care provider. Make sure you discuss any questions  you have with your health care provider. Document Released: 06/10/2008 Document Revised: 08/11/2016 Document Reviewed: 09/16/2015 Elsevier Interactive Patient Education  [employer].

## 2018-09-22 NOTE — Progress Notes (Signed)
Name: Chris Watkins   MRN: [id number]    DOB: 1971-12-07   Date:09/22/2018       Progress Note  Subjective  Chief Complaint CPE  HPI  Patient presents for annual CPE.  USPSTF grade A and B recommendations:  Diet, Exercise: working on diet and exercise, requests nutrition referral to help   Depression: no concerns Depression screen [facility] 2/9 01/18/2018  Decreased Interest 0  Down, Depressed, Hopeless 0  PHQ - 2 Score 0    Hypertension: maintained on amlodipine 10, HCTZ 25, quinapril 40 daily, reports daily medication compliance without noted adverse effects  BP Readings from Last 3 Encounters:  09/22/18 120/72  08/25/18 (!) 144/70  08/25/18 (!) 148/72   Obesity: Wt Readings from Last 3 Encounters:  09/22/18 267 lb (121.1 kg)  08/25/18 274 lb 9.6 oz (124.6 kg)  08/25/18 273 lb (123.8 kg)   BMI Readings from Last 3 Encounters:  09/22/18 37.24 kg/m  08/25/18 38.30 kg/m  08/25/18 38.08 kg/m     Lipids: lipid panel today Lab Results  Component Value Date   CHOL 124 04/24/2015   CHOL 124 04/24/2015   CHOL 335 (H) 01/27/2015   Lab Results  Component Value Date   HDL 29 (A) 04/24/2015   HDL 29 04/24/2015   HDL 18 (L) 01/27/2015   Lab Results  Component Value Date   LDLCALC 82 04/24/2015   LDLCALC NOT CALC 01/27/2015   Lab Results  Component Value Date   TRIG 67 04/24/2015   TRIG 67 04/24/2015   TRIG 1,894 (H) 01/27/2015   Lab Results  Component Value Date   CHOLHDL 18.6 01/27/2015   No results found for: LDLDIRECT  Diabetes- maintained on  Metformin 1000 am, 500 pm, and jardiance 10 was started on 08/25/18 OV due to continued elevated glucose readings at home  Reports daily medication compliance without noted adverse medication effects. home blood sugar readings 110s-140 since adding jardiance, does check sugars regularly  Lab Results  Component Value Date   HGBA1C 7.3 (A) 06/16/2018   Alcohol: no Tobacco use: no, quit smoking in march   HIV:  screening ordered today  Colorectal cancer: No personal or family history of colon ca, no abdominal pain, no bowel changes, no rectal bleeding  Prostate cancer:  Lab Results  Component Value Date   PSA 0.71 02/24/2018   Aspirin: not indicated ECG:  Not indicated  Vaccinations: will get flu vacc at work  [place]: A voluntary discussion about advance care planning including the explanation and discussion of advance directives.  Discussed health care proxy and Living will, and the patient DOES NOT have a living will at present time. If patient does have living will, I have requested they bring this to the clinic to be scanned in to their chart.  Patient Active Problem List   Diagnosis Date Noted  . Eczema 06/16/2018  . Erectile dysfunction 02/25/2018  . Type 2 diabetes mellitus ([place]) 04/23/2015  . Routine general medical examination at a health care facility 04/23/2015  . HTN (hypertension) 04/09/2014    Past Surgical History:  Procedure Laterality Date  . HERNIA REPAIR    . SLIPPED CAPITAL FEMORAL EPIPHYSIS PINNING      Family History  Problem Relation Age of Onset  . Thyroid disease Mother   . Hypertension Father   . Diabetes Father   . Diabetes Maternal Grandmother     Social History   Socioeconomic History  . Marital status: Married  Spouse name: Not on file  . Number of children: 3  . Years of education: 50  . Highest education level: Not on file  Occupational History  . Occupation: Facilities manager  Social Needs  . Financial resource strain: Not on file  . Food insecurity:    Worry: Not on file    Inability: Not on file  . Transportation needs:    Medical: Not on file    Non-medical: Not on file  Tobacco Use  . Smoking status: Former Smoker    Packs/day: 0.50    Types: Cigarettes    Last attempt to quit: 02/27/2018    Years since quitting: 0.5  . Smokeless tobacco: Never Used  Substance and Sexual Activity  . Alcohol use: Yes     Comment: Has not since he was diagnosed with DM in 01/2015  . Drug use: No  . Sexual activity: Not on file  Lifestyle  . Physical activity:    Days per week: Not on file    Minutes per session: Not on file  . Stress: Not on file  Relationships  . Social connections:    Talks on phone: Not on file    Gets together: Not on file    Attends religious service: Not on file    Active member of club or organization: Not on file    Attends meetings of clubs or organizations: Not on file    Relationship status: Not on file  . Intimate partner violence:    Fear of current or ex partner: Not on file    Emotionally abused: Not on file    Physically abused: Not on file    Forced sexual activity: Not on file  Other Topics Concern  . Not on file  Social History Narrative   Lives with his wife and children. Fun: Play golf, sing, listen to music, watch sports.     Denies religious beliefs effecting health care.      Current Outpatient Medications:  .  amLODipine (NORVASC) 10 MG tablet, Take 1 tablet (10 mg total) by mouth daily. Must keep scheduled appt w/new provider for future refills, Disp: 90 tablet, Rfl: 2 .  blood glucose meter kit and supplies, Dispense based on patient and insurance preference. Check fasting blood sugar daily. Diagnosis: Diabetes E11.9, Disp: 1 each, Rfl: 0 .  empagliflozin (JARDIANCE) 10 MG TABS tablet, Take 10 mg by mouth daily., Disp: 30 tablet, Rfl: 1 .  glucose blood (FREESTYLE LITE) test strip, Use to check blood sugars twice a day. Dx E11.9, Disp: 100 each, Rfl: 3 .  glucose monitoring kit (FREESTYLE) monitoring kit, Use to check blood sugars Dx E11.9, Disp: 1 each, Rfl: 0 .  hydrochlorothiazide (HYDRODIURIL) 25 MG tablet, Take 1 tablet (25 mg total) by mouth daily. Must keep scheduled appt w/new provider for future refills, Disp: 90 tablet, Rfl: 2 .  Lancets (FREESTYLE) lancets, Use to check blood sugars twice a day Dx E11.9, Disp: 100 each, Rfl: 3 .  metFORMIN  (GLUCOPHAGE-XR) 500 MG 24 hr tablet, TAKE 2 TABLETS BY MOUTH DAILY WITH BREAKFAST AND 1 TABLET IN THE EVENING, Disp: 270 tablet, Rfl: 1 .  quinapril (ACCUPRIL) 40 MG tablet, Take 1 tablet (40 mg total) by mouth at bedtime., Disp: 90 tablet, Rfl: 2 .  triamcinolone ointment (KENALOG) 0.5 %, Apply 1 application topically 2 (two) times daily., Disp: 30 g, Rfl: 0  No Known Allergies   ROS  Constitutional: Negative for fever or weight change.  Respiratory:  Negative for cough and shortness of breath.   Cardiovascular: Negative for chest pain or palpitations.  Gastrointestinal: Negative for abdominal pain, no bowel changes.  Musculoskeletal: Negative for gait problem or joint swelling.  Skin: Negative for rash.  Neurological: Negative for dizziness or headache.  No other specific complaints in a complete review of systems (except as listed in HPI above).   Objective  Vitals:   09/22/18 1555  BP: 120/72  Pulse: 90  SpO2: 94%  Weight: 267 lb (121.1 kg)  Height: _0  (1.803 m)    Body mass index is 37.24 kg/m.  Physical Exam Vital signs revewed Constitutional: Patient appears well-developed and well-nourished. No distress.  HENT: Head: Normocephalic and atraumatic. Ears: B TMs ok, no erythema or effusion; Nose: Nose normal. Mouth/Throat: Oropharynx is clear and moist. No oropharyngeal exudate.  Eyes: Conjunctivae and EOM are normal. Pupils are equal, round, and reactive to light. No scleral icterus.  Neck: Normal range of motion. Neck supple.  Cardiovascular: Normal rate, regular rhythm and normal heart sounds.  No murmur heard. No BLE edema. Pulmonary/Chest: Effort normal and breath sounds normal. No respiratory distress. Abdominal: Soft. Bowel sounds are normal, no distension. There is no tenderness. no masses Musculoskeletal: Normal range of motion, no joint effusions. No gross deformities Neurological: he is alert and oriented to person, place, and time. No cranial nerve  deficit. Coordination, balance, strength, speech and gait are normal.  Skin: Skin is warm and dry. No rash noted. No erythema.  Psychiatric: Patient has a normal mood and affect. behavior is normal. Judgment and thought content normal.  Assessment & Plan RTC in 6 months for routine F/U: DM-a1c PSA WNL 02/24/18

## 2018-09-22 NOTE — Telephone Encounter (Signed)
Dr. Ander Slade has reviewed the home sleep test this showed Severe Obstructive sleep apnea with severe oxygen desaturations.   Recommendations   In lab titration    Weight loss measures .   Advise against driving while sleepy & against medication with sedative side effects.   Patient is aware nothing further is needed at this time.

## 2018-09-23 LAB — HIV ANTIBODY (ROUTINE TESTING W REFLEX): HIV 1&2 Ab, 4th Generation: NONREACTIVE

## 2018-09-25 ENCOUNTER — Telehealth: Payer: Self-pay | Admitting: Pulmonary Disease

## 2018-09-25 ENCOUNTER — Encounter: Payer: Self-pay | Admitting: Nurse Practitioner

## 2018-09-25 NOTE — Telephone Encounter (Signed)
Spoke to pt & gave him appt info  Nothing further needed. 

## 2018-09-25 NOTE — Assessment & Plan Note (Signed)
Stable Continue current medications Update labs Referral to nutrition education per his request - CBC; Future - Comprehensive metabolic panel; Future - Amb Referral to Nutrition and Diabetic E

## 2018-09-25 NOTE — Assessment & Plan Note (Addendum)
Continue current meds Update labs F/U with further recommendations pending lab results Referral to nutrition education per his request Plan for routine f/u in 6 months, sooner if needed - CBC; Future - Comprehensive metabolic panel; Future - Hemoglobin A1c; Future - Amb Referral to Nutrition and Diabetic E - Microalbumin / creatinine urine ratio; Future - empagliflozin (JARDIANCE) 10 MG TABS tablet; Take 10 mg by mouth daily.  Dispense: 90 tablet; Refill: 2

## 2018-09-25 NOTE — Telephone Encounter (Signed)
Holly had called pt & left vm for him to call back for cpap titration appt info.  I have just called & left him another vm for call back.  Gave him my direct #.

## 2018-09-25 NOTE — Assessment & Plan Note (Signed)
-  USPSTF grade A and B recommendations reviewed with patient; age-appropriate recommendations, preventive care, screening tests, etc discussed and encouraged; healthy living encouraged; see AVS for patient education given to patient -Follow up and care instructions discussed and provided in AVS.   -Reviewed Health Maintenance:   1. Routine general medical examination at a health care facility - CBC; Future - Comprehensive metabolic panel; Future - Hemoglobin A1c; Future - Lipid panel; Future-Screening for cholesterol level Ate 1 peach for lunch, otherwise NPO for 6 hours - Amb Referral to Nutrition and Diabetic E - Microalbumin / creatinine urine ratio; Future - HIV Antibody (routine testing w rflx); Future-Screening for HIV (human immunodeficiency virus)

## 2018-09-28 ENCOUNTER — Other Ambulatory Visit: Payer: Self-pay | Admitting: Nurse Practitioner

## 2018-09-28 DIAGNOSIS — E119 Type 2 diabetes mellitus without complications: Secondary | ICD-10-CM

## 2018-09-28 MED ORDER — ATORVASTATIN CALCIUM 10 MG PO TABS: 10.0000 mg | ORAL_TABLET | Freq: Every day | ORAL | 4 refills | Status: DC

## 2018-09-28 NOTE — Progress Notes (Signed)
orders

## 2018-09-29 ENCOUNTER — Other Ambulatory Visit (INDEPENDENT_AMBULATORY_CARE_PROVIDER_SITE_OTHER): Payer: BLUE CROSS/BLUE SHIELD

## 2018-09-29 DIAGNOSIS — E119 Type 2 diabetes mellitus without complications: Secondary | ICD-10-CM

## 2018-09-29 LAB — HEMOGLOBIN A1C: Hgb A1c MFr Bld: 9.2 % — ABNORMAL HIGH (ref 4.6–6.5)

## 2018-10-02 ENCOUNTER — Other Ambulatory Visit: Payer: Self-pay | Admitting: Nurse Practitioner

## 2018-10-02 DIAGNOSIS — E119 Type 2 diabetes mellitus without complications: Secondary | ICD-10-CM

## 2018-10-02 MED ORDER — METFORMIN HCL ER 500 MG PO TB24: 1000.0000 mg | ORAL_TABLET | Freq: Two times a day (BID) | ORAL | 6 refills | Status: DC

## 2018-10-02 MED ORDER — DULAGLUTIDE 0.75 MG/0.5ML ~~LOC~~ SOAJ: 0.7500 mg | SUBCUTANEOUS | 1 refills | Status: DC

## 2018-10-08 ENCOUNTER — Other Ambulatory Visit: Payer: Self-pay | Admitting: Nurse Practitioner

## 2018-10-08 DIAGNOSIS — I10 Essential (primary) hypertension: Secondary | ICD-10-CM

## 2018-10-10 DIAGNOSIS — L209 Atopic dermatitis, unspecified: Secondary | ICD-10-CM | POA: Diagnosis not present

## 2018-10-11 ENCOUNTER — Encounter: Payer: Self-pay | Admitting: *Deleted

## 2018-10-15 ENCOUNTER — Ambulatory Visit (HOSPITAL_BASED_OUTPATIENT_CLINIC_OR_DEPARTMENT_OTHER): Payer: BLUE CROSS/BLUE SHIELD | Attending: Pulmonary Disease

## 2018-10-15 ENCOUNTER — Encounter

## 2018-10-25 ENCOUNTER — Ambulatory Visit (HOSPITAL_BASED_OUTPATIENT_CLINIC_OR_DEPARTMENT_OTHER): Payer: BLUE CROSS/BLUE SHIELD | Attending: Pulmonary Disease | Admitting: Pulmonary Disease

## 2018-10-25 VITALS — Ht 71.0 in | Wt 264.0 lb

## 2018-10-25 DIAGNOSIS — G4733 Obstructive sleep apnea (adult) (pediatric): Secondary | ICD-10-CM | POA: Diagnosis not present

## 2018-10-25 DIAGNOSIS — E119 Type 2 diabetes mellitus without complications: Secondary | ICD-10-CM | POA: Diagnosis not present

## 2018-10-25 DIAGNOSIS — I493 Ventricular premature depolarization: Secondary | ICD-10-CM | POA: Diagnosis not present

## 2018-10-25 DIAGNOSIS — G4710 Hypersomnia, unspecified: Secondary | ICD-10-CM | POA: Insufficient documentation

## 2018-11-03 ENCOUNTER — Telehealth: Payer: Self-pay | Admitting: Pulmonary Disease

## 2018-11-03 DIAGNOSIS — G4733 Obstructive sleep apnea (adult) (pediatric): Secondary | ICD-10-CM

## 2018-11-03 NOTE — Telephone Encounter (Signed)
Sleep study result  Date of study [id number]  DME referral  CPAP therapy on 18 cm H2O with a Medium size Fisher&Paykel Full Face Mask Simplus mask and heated humidification.   Follow-up in the office 1 to 3 months following initiation of CPAP therapy

## 2018-11-03 NOTE — Telephone Encounter (Signed)
lmom to call back 

## 2018-11-03 NOTE — Procedures (Signed)
POLYSOMNOGRAPHY  Last, First: Chris, Watkins MRN: [id number] Gender: Male Age (years): 18 Weight (lbs): 264 DOB: 06-20-1971 BMI: 37 Primary Care: No PCP Epworth Score: 15 Referring: Laurin Coder MD Technician: Carolin Coy Interpreting: Laurin Coder MD Study Type: CPAP Ordered Study Type: CPAP Study date: 10/25/2018 Location: [place] CLINICAL INFORMATION Chris Watkins is a 47 year old Male and was referred to the sleep center for evaluation of G47.33 OSA: Adult and Pediatric (327.23). Indications include Diabetes (249.00), Established OSA, Hypersomnia, Snoring (786.09).  MEDICATIONS Patient self administered medications include: N/A. Medications administered during study include No sleep medicine administered.  SLEEP STUDY TECHNIQUE The patient underwent an attended overnight polysomnography titration to assess the effects of CPAP therapy. The following variables were monitored: EEG(C4-A1, C3-A2, O1-A2, O2-A1), EOG, submental and leg EMG, ECG, oxyhemoglobin saturation by pulse oximetry, thoracic and abdominal respiratory effort belts, nasal/oral airflow by pressure sensor, body position sensor and snoring sensor. CPAP pressure was titrated to eliminate apneas, hypopneas and oxygen desaturation. Hypopneas were scored per AASM definition IB (4% desaturation)  TECHNICIAN COMMENTS Comments added by Technician: PATIENT WAS ORDERED AS A CPAP TITRATION Comments added by Scorer: N/A SLEEP ARCHITECTURE The study was initiated at 10:41:43 PM and terminated at 5:11:25 AM. Total recorded time was 389.7 minutes. EEG confirmed total sleep time was 372.5 minutes yielding a sleep efficiency of 95.6%%. Sleep onset after lights out was 7.2 minutes with a REM latency of 68.5 minutes. The patient spent 10.9%% of the night in stage N1 sleep, 66.4%% in stage N2 sleep, 0.0%% in stage N3 and 22.7% in REM. The Arousal Index was 18.7/hour. RESPIRATORY PARAMETERS The overall AHI was 15.6 per hour,  and the RDI was 16.8 events/hour with a central apnea index of 1.1per hour. The most appropriate setting of CPAP was 18 cm H2O. At this setting, the sleep efficiency was 100% and the patient was supine for 100%. The AHI was 0.0 events per hour, and the RDI was 0.0 events/hour (with 1.1 central events) and the arousal index was 0.0 per hour.The oxygen nadir was 92.0% during sleep.    The cumulative time under 88% oxygen saturation was 5.5 minutes  LEG MOVEMENT DATA The total leg movements were 34 with a resulting leg movement index of 5.5/hr. Associated arousal with leg movement index was 0.2/hr. CARDIAC DATA The underlying cardiac rhythm was most consistent with sinus rhythm. Mean heart rate during sleep was 71.2 bpm. Additional rhythm abnormalities include PVCs.   IMPRESSIONS - The patient snored with moderate snoring volume. - Severe Oxygen Desaturation - Moderate Obstructive Sleep apnea(OSA) Optimal pressure attained. - No Significant Central Sleep Apnea (CSA) - No significant periodic leg movements(PLMs) during sleep.  - PVCs on EKG - Normal sleep efficiency, normal primary sleep latency, short REM sleep latency and no slow wave latency.   DIAGNOSIS - Obstructive Sleep Apnea (327.23 [G47.33 ICD-10])   RECOMMENDATIONS - CPAP therapy on 18 cm H2O with a Medium size Fisher&Paykel Full Face Mask Simplus mask and heated humidification. - Avoid alcohol, sedatives and other CNS depressants that may worsen sleep apnea and disrupt normal sleep architecture. - Sleep hygiene should be reviewed to assess factors that may improve sleep quality. - Weight management and regular exercise should be initiated or continued. - Return to Sleep Center for re-evaluation after 4 weeks of therapy  [Electronically signed] 11/03/2018 06:59 AM  Sherrilyn Rist MD NPI: [id number]

## 2018-11-08 NOTE — Telephone Encounter (Signed)
lmom  X 2

## 2018-11-09 NOTE — Telephone Encounter (Signed)
Called and spoke with pt letting him know the results of the sleep study and based on the results, AO wanted him to start on CPAP. Pt expressed understanding.   I have placed the order for pt to begin CPAP therapy and also scheduled pt a follow up appt after start of cpap. Nothing further needed.

## 2018-11-09 NOTE — Telephone Encounter (Signed)
Pt is calling back [phone]

## 2018-11-16 DIAGNOSIS — L209 Atopic dermatitis, unspecified: Secondary | ICD-10-CM | POA: Diagnosis not present

## 2018-11-16 DIAGNOSIS — L239 Allergic contact dermatitis, unspecified cause: Secondary | ICD-10-CM | POA: Diagnosis not present

## 2018-11-16 DIAGNOSIS — L810 Postinflammatory hyperpigmentation: Secondary | ICD-10-CM | POA: Diagnosis not present

## 2018-12-22 ENCOUNTER — Telehealth: Payer: Self-pay | Admitting: Nurse Practitioner

## 2018-12-22 NOTE — Telephone Encounter (Signed)
Copied from [place] [id number]. Topic: Quick Communication - Rx Refill/Question >> Dec 22, 2018  4:53 PM Alanda Slim E wrote: Medication: empagliflozin (JARDIANCE) 10 MG TABS tablet -( Pt were paying $15 for the script and now it is $105. Pt would like to know if something else more cost efficient can be prescribed. Pt is going to try to get med using a prescription card he found and see if he can get the meds at no to low cost. Pt will call back to advise if the card works for him)   Has the patient contacted their pharmacy? Yes.   Pharmacy advise Pt of the new cost for the prescription. Pt called insurance and medication was moved to another tier.   Preferred Pharmacy (with phone number or street name): [id number] [place], [place] - [address] [phone] (Phone) [phone] (Fax)

## 2019-01-11 ENCOUNTER — Other Ambulatory Visit: Payer: Self-pay | Admitting: Nurse Practitioner

## 2019-01-11 DIAGNOSIS — I10 Essential (primary) hypertension: Secondary | ICD-10-CM

## 2019-01-31 ENCOUNTER — Telehealth: Payer: Self-pay | Admitting: Pulmonary Disease

## 2019-01-31 DIAGNOSIS — G4733 Obstructive sleep apnea (adult) (pediatric): Secondary | ICD-10-CM

## 2019-01-31 NOTE — Telephone Encounter (Signed)
Call made to patient, he states [facility] is telling him he will have to pay at minimum $100 plus dollars per month for his cpap. He called his insurance and they said [facility] is not in network. They are in network with [facility], [place], [facility], and [facility]. These are the DME companies that works with his insurance. I informed patient I would get this message sent to our [facility] pool so they can forward the order. Nothing further is needed at this time.   Ladies can you please help with this. Thanks.

## 2019-02-01 NOTE — Telephone Encounter (Signed)
I called the patient none of the homecare companies he gave [place] do cpap machines he is going to [employer] his insurance and see what he can find out and call us back I have sent a message to the nurse on this

## 2019-02-02 ENCOUNTER — Ambulatory Visit: Payer: BLUE CROSS/BLUE SHIELD | Admitting: Pulmonary Disease

## 2019-02-13 ENCOUNTER — Other Ambulatory Visit: Payer: Self-pay | Admitting: *Deleted

## 2019-02-13 MED ORDER — FREESTYLE LANCETS MISC: 3 refills | Status: DC

## 2019-02-19 ENCOUNTER — Telehealth: Payer: Self-pay | Admitting: Pulmonary Disease

## 2019-02-19 NOTE — Telephone Encounter (Signed)
Order has been sent to [facility] 

## 2019-02-19 NOTE — Telephone Encounter (Signed)
[employer] [place], Artist, Therapist, music, Second to [employer], [employer], [employer], and [employer].

## 2019-02-28 DIAGNOSIS — G4733 Obstructive sleep apnea (adult) (pediatric): Secondary | ICD-10-CM | POA: Diagnosis not present

## 2019-03-14 ENCOUNTER — Other Ambulatory Visit: Payer: Self-pay | Admitting: *Deleted

## 2019-03-14 DIAGNOSIS — E119 Type 2 diabetes mellitus without complications: Secondary | ICD-10-CM

## 2019-03-14 MED ORDER — METFORMIN HCL ER 500 MG PO TB24: 1000.0000 mg | ORAL_TABLET | Freq: Two times a day (BID) | ORAL | 3 refills | Status: DC

## 2019-03-21 ENCOUNTER — Telehealth: Payer: Self-pay

## 2019-03-21 NOTE — Telephone Encounter (Signed)
Please have his pharmacy send refill requests when they are needed.

## 2019-03-21 NOTE — Telephone Encounter (Signed)
Called and spoke with patient. I am going to cancel his appointment for Monday and he is going to call back to reschedule as he is at work right now. He states in the meantime he is going to run out of all of his meds (he believes) and is requesting to have them refilled until he can be seen in the office. From what I can see most of his meds had refills from [facility] (unless I am missing something) and he just got a refill on his metformin on 03/14/19 from [facility].

## 2019-03-26 ENCOUNTER — Ambulatory Visit: Payer: BLUE CROSS/BLUE SHIELD | Admitting: Nurse Practitioner

## 2019-03-26 ENCOUNTER — Ambulatory Visit: Payer: BLUE CROSS/BLUE SHIELD | Admitting: Family

## 2019-03-31 DIAGNOSIS — G4733 Obstructive sleep apnea (adult) (pediatric): Secondary | ICD-10-CM | POA: Diagnosis not present

## 2019-04-05 ENCOUNTER — Telehealth: Payer: Self-pay | Admitting: Family

## 2019-04-05 DIAGNOSIS — E119 Type 2 diabetes mellitus without complications: Secondary | ICD-10-CM

## 2019-04-05 MED ORDER — ATORVASTATIN CALCIUM 10 MG PO TABS: 10.0000 mg | ORAL_TABLET | Freq: Every day | ORAL | 2 refills | Status: DC

## 2019-04-05 NOTE — Telephone Encounter (Signed)
Copied from [place] [id number]. Topic: Quick Communication - Rx Refill/Question >> Apr 05, 2019  2:58 PM Sheran Luz wrote: Medication: atorvastatin (LIPITOR) 10 MG tablet   Patient is requesting a refill of this medication.   Preferred Pharmacy (with phone number or street name):[id number] [place], [place] - [address] [phone] (Phone) [phone] (Fax

## 2019-04-05 NOTE — Telephone Encounter (Signed)
Refill sent. See meds.  

## 2019-04-09 ENCOUNTER — Other Ambulatory Visit: Payer: Self-pay | Admitting: Family

## 2019-04-09 DIAGNOSIS — E119 Type 2 diabetes mellitus without complications: Secondary | ICD-10-CM

## 2019-04-09 DIAGNOSIS — N529 Male erectile dysfunction, unspecified: Secondary | ICD-10-CM

## 2019-04-09 MED ORDER — SILDENAFIL CITRATE 100 MG PO TABS: 50.0000 mg | ORAL_TABLET | Freq: Every day | ORAL | 0 refills | Status: DC | PRN

## 2019-04-11 ENCOUNTER — Other Ambulatory Visit (INDEPENDENT_AMBULATORY_CARE_PROVIDER_SITE_OTHER): Payer: BLUE CROSS/BLUE SHIELD

## 2019-04-11 DIAGNOSIS — N529 Male erectile dysfunction, unspecified: Secondary | ICD-10-CM

## 2019-04-11 DIAGNOSIS — E119 Type 2 diabetes mellitus without complications: Secondary | ICD-10-CM

## 2019-04-11 LAB — COMPREHENSIVE METABOLIC PANEL
ALT: 19 U/L (ref 0–53)
AST: 16 U/L (ref 0–37)
Albumin: 4.3 g/dL (ref 3.5–5.2)
Alkaline Phosphatase: 76 U/L (ref 39–117)
BUN: 20 mg/dL (ref 6–23)
CO2: 27 mEq/L (ref 19–32)
Calcium: 9.5 mg/dL (ref 8.4–10.5)
Chloride: 101 mEq/L (ref 96–112)
Creatinine, Ser: 0.88 mg/dL (ref 0.40–1.50)
GFR: 111.55 mL/min (ref >60.00)
Glucose, Bld: 92 mg/dL (ref 70–99)
Potassium: 3.5 mEq/L (ref 3.5–5.1)
Sodium: 138 mEq/L (ref 135–145)
Total Bilirubin: 0.5 mg/dL (ref 0.2–1.2)
Total Protein: 7.6 g/dL (ref 6.0–8.3)

## 2019-04-11 LAB — CBC WITH DIFFERENTIAL/PLATELET
Basophils Absolute: 0.1 10*3/uL (ref 0.0–0.1)
Basophils Relative: 1 % (ref 0.0–3.0)
Eosinophils Absolute: 0.1 10*3/uL (ref 0.0–0.7)
Eosinophils Relative: 0.9 % (ref 0.0–5.0)
HCT: 40.6 % (ref 39.0–52.0)
Hemoglobin: 13.9 g/dL (ref 13.0–17.0)
Lymphocytes Relative: 27.7 % (ref 12.0–46.0)
Lymphs Abs: 1.7 10*3/uL (ref 0.7–4.0)
MCHC: 34.2 g/dL (ref 30.0–36.0)
MCV: 86.4 fl (ref 78.0–100.0)
Monocytes Absolute: 0.6 10*3/uL (ref 0.1–1.0)
Monocytes Relative: 9.3 % (ref 3.0–12.0)
Neutro Abs: 3.8 10*3/uL (ref 1.4–7.7)
Neutrophils Relative %: 61.1 % (ref 43.0–77.0)
Platelets: 197 10*3/uL (ref 150.0–400.0)
RBC: 4.7 Mil/uL (ref 4.22–5.81)
RDW: 14.7 % (ref 11.5–15.5)
WBC: 6.3 10*3/uL (ref 4.0–10.5)

## 2019-04-11 LAB — HEMOGLOBIN A1C: Hgb A1c MFr Bld: 6.7 % — ABNORMAL HIGH (ref 4.6–6.5)

## 2019-04-11 LAB — LIPID PANEL
Cholesterol: 119 mg/dL (ref 0–200)
HDL: 27.9 mg/dL — ABNORMAL LOW (ref >39.00)
LDL Cholesterol: 70 mg/dL (ref 0–99)
NonHDL: 91
Total CHOL/HDL Ratio: 4
Triglycerides: 104 mg/dL (ref 0.0–149.0)
VLDL: 20.8 mg/dL (ref 0.0–40.0)

## 2019-04-11 LAB — PSA: PSA: 0.6 ng/mL (ref 0.10–4.00)

## 2019-04-13 ENCOUNTER — Encounter: Payer: Self-pay | Admitting: Family

## 2019-04-13 ENCOUNTER — Ambulatory Visit (INDEPENDENT_AMBULATORY_CARE_PROVIDER_SITE_OTHER): Payer: BLUE CROSS/BLUE SHIELD | Admitting: Family

## 2019-04-13 ENCOUNTER — Other Ambulatory Visit: Payer: Self-pay | Admitting: Family

## 2019-04-13 VITALS — BP 117/68 | Wt 260.0 lb

## 2019-04-13 DIAGNOSIS — E119 Type 2 diabetes mellitus without complications: Secondary | ICD-10-CM

## 2019-04-13 DIAGNOSIS — E785 Hyperlipidemia, unspecified: Secondary | ICD-10-CM

## 2019-04-13 DIAGNOSIS — I10 Essential (primary) hypertension: Secondary | ICD-10-CM | POA: Diagnosis not present

## 2019-04-13 MED ORDER — EMPAGLIFLOZIN 10 MG PO TABS: 10.0000 mg | ORAL_TABLET | Freq: Every day | ORAL | 1 refills | Status: DC

## 2019-04-13 MED ORDER — ATORVASTATIN CALCIUM 20 MG PO TABS: 20.0000 mg | ORAL_TABLET | Freq: Every day | ORAL | 1 refills | Status: DC

## 2019-04-13 MED ORDER — HYDROCHLOROTHIAZIDE 25 MG PO TABS: 25.0000 mg | ORAL_TABLET | Freq: Every day | ORAL | 1 refills | Status: DC

## 2019-04-13 NOTE — Progress Notes (Signed)
Chris Watkins is a 48 y.o. male with the following history as recorded in EpicCare:  Patient Active Problem List   Diagnosis Date Noted  . Eczema 06/16/2018  . Erectile dysfunction 02/25/2018  . Type 2 diabetes mellitus ([place]) 04/23/2015  . Routine general medical examination at a health care facility 04/23/2015  . HTN (hypertension) 04/09/2014    Current Outpatient Medications  Medication Sig Dispense Refill  . amLODipine (NORVASC) 10 MG tablet TAKE 1 TABLET BY MOUTH DAILY. MUST KEEP SCHEDULED APPT W/NEW PROVIDER FOR FUTURE REFILLS 90 tablet 2  . atorvastatin (LIPITOR) 20 MG tablet Take 1 tablet (20 mg total) by mouth daily. 90 tablet 1  . blood glucose meter kit and supplies Dispense based on patient and insurance preference. Check fasting blood sugar daily. Diagnosis: Diabetes E11.9 1 each 0  . empagliflozin (JARDIANCE) 10 MG TABS tablet Take 10 mg by mouth daily. 90 tablet 1  . glucose blood (FREESTYLE LITE) test strip Use to check blood sugars twice a day. Dx E11.9 100 each 3  . glucose monitoring kit (FREESTYLE) monitoring kit Use to check blood sugars Dx E11.9 1 each 0  . hydrochlorothiazide (HYDRODIURIL) 25 MG tablet Take 1 tablet (25 mg total) by mouth daily. 90 tablet 1  . Lancets (FREESTYLE) lancets Use to check blood sugars twice a day Dx E11.9 100 each 3  . metFORMIN (GLUCOPHAGE-XR) 500 MG 24 hr tablet Take 2 tablets (1,000 mg total) by mouth 2 (two) times daily. (Patient taking differently: Take 1,000 mg by mouth 2 (two) times daily. Take 2 tablets in the am and 1 tablet in the pm) 120 tablet 3  . quinapril (ACCUPRIL) 40 MG tablet TAKE 1 TABLET BY MOUTH EVERYDAY AT BEDTIME 90 tablet 2  . triamcinolone ointment (KENALOG) 0.5 % Apply 1 application topically 2 (two) times daily. 30 g 0   No current facility-administered medications for this visit.     Allergies: Patient has no known allergies.  Past Medical History:  Diagnosis Date  . Chicken pox   . Diabetes mellitus  without complication ([place])   . Hypertension     Past Surgical History:  Procedure Laterality Date  . HERNIA REPAIR    . SLIPPED CAPITAL FEMORAL EPIPHYSIS PINNING      Family History  Problem Relation Age of Onset  . Thyroid disease Mother   . Hypertension Father   . Diabetes Father   . Diabetes Maternal Grandmother     Social History   Tobacco Use  . Smoking status: Former Smoker    Packs/day: 0.50    Types: Cigarettes    Last attempt to quit: 02/27/2018    Years since quitting: 1.1  . Smokeless tobacco: Never Used  Substance Use Topics  . Alcohol use: Yes    Comment: Has not since he was diagnosed with DM in 01/2015    Subjective:    I connected with Adelene Amas on 04/13/19 at  4:00 PM EDT by a video enabled telemedicine application and verified that I am speaking with the correct person using two identifiers.   I discussed the limitations of evaluation and management by telemedicine and the availability of in person appointments. The patient expressed understanding and agreed to proceed. Patient and I were the only 2 people present on the phone call.  Follow-up on chronic care needs including: 1) Type 2 Diabetes; 2) Hypertension and 3) Hyperlipidemia; labs were done earlier this week; Denies any chest pain, shortness of breath, blurred vision or  headache Notes that blood sugar averages 122; blood pressure was checked at work 2 weeks ago and blood pressure was 117/68; no concerns tolerating Lipitor;  Does like how he has responded to combination of Metformin and Jardiance; no concerns for low blood sugar;    Objective:  Vitals:   04/13/19 1702  BP: 117/68  Weight: 260 lb (117.9 kg)    General: Well developed, well nourished, in no acute distress   Head: Normocephalic and atraumatic  Lungs: Respirations unlabored;  Neurologic: Alert and oriented; speech intact; face symmetrical;   Assessment:  1. Type 2 diabetes mellitus without complication, without long-term  current use of insulin ([place])   2. Essential hypertension   3. Hyperlipidemia, unspecified hyperlipidemia type     Plan:  1. Excellent control- Hgba1c at 6.7; continue Jardiance and Metformin; follow-up in 3 months; 2. Stable; regular checks being done by employer; 3. HDL is still quite low- will try increasing Lipitor to 20 mg for now; re-check in 3 months; may need to add second agent.   Follow-up in 3 months- he prefers to call back and schedule at later date.    No follow-ups on file.  No orders of the defined types were placed in this encounter.   Requested Prescriptions   Signed Prescriptions Disp Refills  . atorvastatin (LIPITOR) 20 MG tablet 90 tablet 1    Sig: Take 1 tablet (20 mg total) by mouth daily.  . empagliflozin (JARDIANCE) 10 MG TABS tablet 90 tablet 1    Sig: Take 10 mg by mouth daily.  . hydrochlorothiazide (HYDRODIURIL) 25 MG tablet 90 tablet 1    Sig: Take 1 tablet (25 mg total) by mouth daily.

## 2019-04-30 DIAGNOSIS — G4733 Obstructive sleep apnea (adult) (pediatric): Secondary | ICD-10-CM | POA: Diagnosis not present

## 2019-05-30 ENCOUNTER — Other Ambulatory Visit: Payer: Self-pay | Admitting: Internal Medicine

## 2019-05-30 ENCOUNTER — Telehealth: Payer: Self-pay | Admitting: Nurse Practitioner

## 2019-05-30 DIAGNOSIS — E119 Type 2 diabetes mellitus without complications: Secondary | ICD-10-CM

## 2019-05-30 NOTE — Telephone Encounter (Signed)
Copied from [place] [id number]. Topic: Quick Communication - Rx Refill/Question >> May 30, 2019  5:15 PM Keene Breath wrote: Medication: atorvastatin (LIPITOR) 20 MG tablet  Patient called to request a refill  Preferred Pharmacy (with phone number or street name): [id number] [place], [place] - [address] [phone] (Phone) [phone] (Fax)

## 2019-05-31 DIAGNOSIS — G4733 Obstructive sleep apnea (adult) (pediatric): Secondary | ICD-10-CM | POA: Diagnosis not present

## 2019-06-01 NOTE — Telephone Encounter (Signed)
Called and left today for patient that the still had one refill left for 90 day supply from his refill back in April.

## 2019-06-05 NOTE — Telephone Encounter (Signed)
Advised pt / he was told by pharmacy that no refill was available/ I called to verify and they have it/ Pt will pick up today. Thank you

## 2019-06-13 ENCOUNTER — Other Ambulatory Visit: Payer: Self-pay | Admitting: *Deleted

## 2019-06-13 DIAGNOSIS — E119 Type 2 diabetes mellitus without complications: Secondary | ICD-10-CM

## 2019-06-13 MED ORDER — METFORMIN HCL ER 500 MG PO TB24: 1000.0000 mg | ORAL_TABLET | Freq: Two times a day (BID) | ORAL | 1 refills | Status: DC

## 2019-07-16 ENCOUNTER — Ambulatory Visit: Payer: BLUE CROSS/BLUE SHIELD | Admitting: Family

## 2019-07-16 ENCOUNTER — Other Ambulatory Visit (INDEPENDENT_AMBULATORY_CARE_PROVIDER_SITE_OTHER): Payer: BC Managed Care – PPO

## 2019-07-16 ENCOUNTER — Other Ambulatory Visit: Payer: Self-pay | Admitting: Family

## 2019-07-16 DIAGNOSIS — E119 Type 2 diabetes mellitus without complications: Secondary | ICD-10-CM | POA: Diagnosis not present

## 2019-07-16 LAB — COMPREHENSIVE METABOLIC PANEL
ALT: 15 U/L (ref 0–53)
AST: 13 U/L (ref 0–37)
Albumin: 4.4 g/dL (ref 3.5–5.2)
Alkaline Phosphatase: 79 U/L (ref 39–117)
BUN: 15 mg/dL (ref 6–23)
CO2: 26 mEq/L (ref 19–32)
Calcium: 9.6 mg/dL (ref 8.4–10.5)
Chloride: 101 mEq/L (ref 96–112)
Creatinine, Ser: 0.71 mg/dL (ref 0.40–1.50)
GFR: 142.75 mL/min (ref >60.00)
Glucose, Bld: 85 mg/dL (ref 70–99)
Potassium: 3.6 mEq/L (ref 3.5–5.1)
Sodium: 137 mEq/L (ref 135–145)
Total Bilirubin: 0.4 mg/dL (ref 0.2–1.2)
Total Protein: 7.7 g/dL (ref 6.0–8.3)

## 2019-07-16 LAB — LIPID PANEL
Cholesterol: 100 mg/dL (ref 0–200)
HDL: 26.5 mg/dL — ABNORMAL LOW (ref >39.00)
LDL Cholesterol: 60 mg/dL (ref 0–99)
NonHDL: 73.1
Total CHOL/HDL Ratio: 4
Triglycerides: 65 mg/dL (ref 0.0–149.0)
VLDL: 13 mg/dL (ref 0.0–40.0)

## 2019-07-16 LAB — HEMOGLOBIN A1C: Hgb A1c MFr Bld: 6.5 % (ref 4.6–6.5)

## 2019-07-23 ENCOUNTER — Other Ambulatory Visit: Payer: Self-pay | Admitting: Family

## 2019-07-23 ENCOUNTER — Ambulatory Visit: Payer: BC Managed Care – PPO | Admitting: Family

## 2019-07-23 ENCOUNTER — Telehealth: Payer: Self-pay | Admitting: Nurse Practitioner

## 2019-07-23 DIAGNOSIS — E119 Type 2 diabetes mellitus without complications: Secondary | ICD-10-CM

## 2019-07-23 DIAGNOSIS — Z0289 Encounter for other administrative examinations: Secondary | ICD-10-CM

## 2019-07-23 DIAGNOSIS — I10 Essential (primary) hypertension: Secondary | ICD-10-CM

## 2019-07-23 MED ORDER — AMLODIPINE BESYLATE 10 MG PO TABS: ORAL_TABLET | ORAL | 0 refills | Status: DC

## 2019-07-23 NOTE — Telephone Encounter (Signed)
Medication: metFORMIN (GLUCOPHAGE-XR) 500 MG 24 hr tablet [[id number]]   Has the patient contacted their pharmacy? Yes  (Agent: If no, request that the patient contact the pharmacy for the refill.) (Agent: If yes, when and what did the pharmacy advise?)  Preferred Pharmacy (with phone number or street name): [id number] - [place], [place] - [address] [phone] (Phone) [phone] (Fax    Agent: Please be advised that RX refills may take up to 3 business days. We ask that you follow-up with your pharmacy.

## 2019-07-24 MED ORDER — METFORMIN HCL ER 500 MG PO TB24: 1000.0000 mg | ORAL_TABLET | Freq: Two times a day (BID) | ORAL | 0 refills | Status: DC

## 2019-07-24 NOTE — Telephone Encounter (Signed)
Refill sent. See meds.  

## 2019-07-25 ENCOUNTER — Telehealth: Payer: Self-pay | Admitting: Family

## 2019-07-25 NOTE — Telephone Encounter (Signed)
Pt called regarding the dosage change on his metFORMIN (GLUCOPHAGE-XR) 500 MG 24 hr tablet. Would like to speak with someone regarding the change. Please advise.

## 2019-07-26 NOTE — Telephone Encounter (Signed)
Called and left message for patient to return call to clinic. 

## 2019-08-16 ENCOUNTER — Other Ambulatory Visit: Payer: Self-pay | Admitting: Internal Medicine

## 2019-08-16 DIAGNOSIS — E119 Type 2 diabetes mellitus without complications: Secondary | ICD-10-CM

## 2019-09-22 ENCOUNTER — Other Ambulatory Visit: Payer: Self-pay | Admitting: Internal Medicine

## 2019-09-22 DIAGNOSIS — E119 Type 2 diabetes mellitus without complications: Secondary | ICD-10-CM

## 2019-10-05 ENCOUNTER — Ambulatory Visit (INDEPENDENT_AMBULATORY_CARE_PROVIDER_SITE_OTHER): Payer: BC Managed Care – PPO | Admitting: Family

## 2019-10-05 ENCOUNTER — Encounter: Payer: Self-pay | Admitting: Family

## 2019-10-05 ENCOUNTER — Other Ambulatory Visit: Payer: Self-pay

## 2019-10-05 ENCOUNTER — Other Ambulatory Visit (INDEPENDENT_AMBULATORY_CARE_PROVIDER_SITE_OTHER): Payer: BC Managed Care – PPO

## 2019-10-05 VITALS — BP 128/80 | HR 89 | Temp 97.9°F | Ht 71.0 in | Wt 252.8 lb

## 2019-10-05 DIAGNOSIS — E782 Mixed hyperlipidemia: Secondary | ICD-10-CM

## 2019-10-05 DIAGNOSIS — E119 Type 2 diabetes mellitus without complications: Secondary | ICD-10-CM

## 2019-10-05 DIAGNOSIS — I10 Essential (primary) hypertension: Secondary | ICD-10-CM

## 2019-10-05 DIAGNOSIS — L309 Dermatitis, unspecified: Secondary | ICD-10-CM | POA: Diagnosis not present

## 2019-10-05 LAB — COMPREHENSIVE METABOLIC PANEL
ALT: 17 U/L (ref 0–53)
AST: 17 U/L (ref 0–37)
Albumin: 4.3 g/dL (ref 3.5–5.2)
Alkaline Phosphatase: 81 U/L (ref 39–117)
BUN: 15 mg/dL (ref 6–23)
CO2: 27 mEq/L (ref 19–32)
Calcium: 9.4 mg/dL (ref 8.4–10.5)
Chloride: 101 mEq/L (ref 96–112)
Creatinine, Ser: 0.71 mg/dL (ref 0.40–1.50)
GFR: 142.62 mL/min (ref >60.00)
Glucose, Bld: 96 mg/dL (ref 70–99)
Potassium: 3.5 mEq/L (ref 3.5–5.1)
Sodium: 138 mEq/L (ref 135–145)
Total Bilirubin: 0.5 mg/dL (ref 0.2–1.2)
Total Protein: 7.8 g/dL (ref 6.0–8.3)

## 2019-10-05 LAB — LIPID PANEL
Cholesterol: 109 mg/dL (ref 0–200)
HDL: 29.3 mg/dL — ABNORMAL LOW (ref >39.00)
LDL Cholesterol: 64 mg/dL (ref 0–99)
NonHDL: 80.1
Total CHOL/HDL Ratio: 4
Triglycerides: 83 mg/dL (ref 0.0–149.0)
VLDL: 16.6 mg/dL (ref 0.0–40.0)

## 2019-10-05 LAB — CBC WITH DIFFERENTIAL/PLATELET
Basophils Absolute: 0 10*3/uL (ref 0.0–0.1)
Basophils Relative: 0.7 % (ref 0.0–3.0)
Eosinophils Absolute: 0.1 10*3/uL (ref 0.0–0.7)
Eosinophils Relative: 0.9 % (ref 0.0–5.0)
HCT: 42.2 % (ref 39.0–52.0)
Hemoglobin: 13.9 g/dL (ref 13.0–17.0)
Lymphocytes Relative: 33 % (ref 12.0–46.0)
Lymphs Abs: 1.9 10*3/uL (ref 0.7–4.0)
MCHC: 33 g/dL (ref 30.0–36.0)
MCV: 87.9 fl (ref 78.0–100.0)
Monocytes Absolute: 0.6 10*3/uL (ref 0.1–1.0)
Monocytes Relative: 9.7 % (ref 3.0–12.0)
Neutro Abs: 3.2 10*3/uL (ref 1.4–7.7)
Neutrophils Relative %: 55.7 % (ref 43.0–77.0)
Platelets: 203 10*3/uL (ref 150.0–400.0)
RBC: 4.79 Mil/uL (ref 4.22–5.81)
RDW: 14.6 % (ref 11.5–15.5)
WBC: 5.7 10*3/uL (ref 4.0–10.5)

## 2019-10-05 LAB — HEMOGLOBIN A1C: Hgb A1c MFr Bld: 6.6 % — ABNORMAL HIGH (ref 4.6–6.5)

## 2019-10-05 MED ORDER — METFORMIN HCL ER 500 MG PO TB24: ORAL_TABLET | ORAL | 3 refills | Status: DC

## 2019-10-05 MED ORDER — TRIAMCINOLONE ACETONIDE 0.5 % EX OINT: 1.0000 "application " | TOPICAL_OINTMENT | Freq: Two times a day (BID) | CUTANEOUS | 0 refills | Status: DC

## 2019-10-05 MED ORDER — HYDROCHLOROTHIAZIDE 25 MG PO TABS: 25.0000 mg | ORAL_TABLET | Freq: Every day | ORAL | 3 refills | Status: DC

## 2019-10-05 MED ORDER — AMLODIPINE BESYLATE 10 MG PO TABS: ORAL_TABLET | ORAL | 3 refills | Status: DC

## 2019-10-05 MED ORDER — QUINAPRIL HCL 40 MG PO TABS: ORAL_TABLET | ORAL | 3 refills | Status: DC

## 2019-10-05 MED ORDER — JARDIANCE 10 MG PO TABS: 10.0000 mg | ORAL_TABLET | Freq: Every day | ORAL | 1 refills | Status: DC

## 2019-10-05 NOTE — Progress Notes (Signed)
Chris Watkins is a 48 y.o. male with the following history as recorded in EpicCare:  Patient Active Problem List   Diagnosis Date Noted  . Eczema 06/16/2018  . Erectile dysfunction 02/25/2018  . Type 2 diabetes mellitus ([place]) 04/23/2015  . Routine general medical examination at a health care facility 04/23/2015  . HTN (hypertension) 04/09/2014    Current Outpatient Medications  Medication Sig Dispense Refill  . amLODipine (NORVASC) 10 MG tablet TAKE 1 TABLET BY MOUTH DAILY. 90 tablet 3  . blood glucose meter kit and supplies Dispense based on patient and insurance preference. Check fasting blood sugar daily. Diagnosis: Diabetes E11.9 1 each 0  . empagliflozin (JARDIANCE) 10 MG TABS tablet Take 10 mg by mouth daily. 90 tablet 1  . glucose blood (FREESTYLE LITE) test strip Use to check blood sugars twice a day. Dx E11.9 100 each 3  . glucose monitoring kit (FREESTYLE) monitoring kit Use to check blood sugars Dx E11.9 1 each 0  . hydrochlorothiazide (HYDRODIURIL) 25 MG tablet Take 1 tablet (25 mg total) by mouth daily. 90 tablet 3  . Lancets (FREESTYLE) lancets Use to check blood sugars twice a day Dx E11.9 100 each 3  . metFORMIN (GLUCOPHAGE-XR) 500 MG 24 hr tablet Take 2 tablets in the am; take 1 in the pm 270 tablet 3  . quinapril (ACCUPRIL) 40 MG tablet TAKE 1 TABLET BY MOUTH EVERYDAY AT BEDTIME 90 tablet 3  . triamcinolone ointment (KENALOG) 0.5 % Apply 1 application topically 2 (two) times daily. 60 g 0   No current facility-administered medications for this visit.     Allergies: Patient has no known allergies.  Past Medical History:  Diagnosis Date  . Chicken pox   . Diabetes mellitus without complication ([place])   . Hypertension     Past Surgical History:  Procedure Laterality Date  . HERNIA REPAIR    . SLIPPED CAPITAL FEMORAL EPIPHYSIS PINNING      Family History  Problem Relation Age of Onset  . Thyroid disease Mother   . Hypertension Father   . Diabetes Father   .  Diabetes Maternal Grandmother     Social History   Tobacco Use  . Smoking status: Former Smoker    Packs/day: 0.50    Types: Cigarettes    Quit date: 02/27/2018    Years since quitting: 1.6  . Smokeless tobacco: Never Used  Substance Use Topics  . 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  Head: Normocephalic and atraumatic  Lungs: Respirations unlabored; clear to auscultation bilaterally without wheeze, rales, rhonchi  CVS exam: normal  rate and regular rhythm.  Neurologic: Alert and oriented; speech intact; face symmetrical; moves all extremities well; CNII-XII intact without focal deficit   Assessment:  1. Type 2 diabetes mellitus without complication, without long-term current use of insulin ([place])   2. Mixed hyperlipidemia   3. Essential hypertension   4. Eczema, unspecified type     Plan:  1. Check labs today; refills updated; congratulated patient on his commitment to his health; encouraged to keep working on weight loss goals; Has lost 8 pounds since OV in April; 2. Patient does not want to take statins at this time; 3. Stable; refills updated; 4. Refill updated on  Triamcinolone; samples of Eucrisa given as well.   No follow-ups on file.  Orders Placed This Encounter  Procedures  . CBC w/Diff    Standing Status:   Future    Number of Occurrences:   1    Standing Expiration Date:   10/04/2020  . Comp Met (CMET)    Standing Status:   Future    Number of Occurrences:   1    Standing Expiration Date:   10/04/2020  . HgB A1c    Standing Status:   Future    Number of Occurrences:   1    Standing Expiration Date:   10/04/2020  . Lipid panel    Standing Status:   Future    Number of Occurrences:   1    Standing Expiration Date:   10/04/2020    Requested Prescriptions   Signed Prescriptions Disp Refills  . amLODipine (NORVASC) 10 MG tablet 90 tablet 3    Sig: TAKE 1 TABLET BY MOUTH DAILY.  Marland Kitchen quinapril (ACCUPRIL) 40 MG tablet 90 tablet 3    Sig: TAKE 1 TABLET BY MOUTH EVERYDAY AT BEDTIME  . empagliflozin (JARDIANCE) 10 MG TABS tablet 90 tablet 1    Sig: Take 10 mg by mouth daily.  . hydrochlorothiazide (HYDRODIURIL) 25 MG tablet 90 tablet 3    Sig: Take 1 tablet (25 mg total) by mouth daily.  . metFORMIN (GLUCOPHAGE-XR) 500 MG 24 hr tablet 270 tablet 3    Sig: Take 2 tablets in the am; take 1 in the pm  . triamcinolone ointment (KENALOG) 0.5 % 60 g 0    Sig: Apply 1 application topically 2 (two) times daily.

## 2019-10-15 ENCOUNTER — Other Ambulatory Visit: Payer: Self-pay | Admitting: Internal Medicine

## 2019-10-15 DIAGNOSIS — E119 Type 2 diabetes mellitus without complications: Secondary | ICD-10-CM

## 2019-10-15 NOTE — Telephone Encounter (Signed)
Chris Watkins pt 

## 2019-11-01 ENCOUNTER — Other Ambulatory Visit: Payer: Self-pay

## 2019-11-01 ENCOUNTER — Telehealth: Payer: Self-pay

## 2019-11-01 ENCOUNTER — Other Ambulatory Visit: Payer: Self-pay | Admitting: Family

## 2019-11-01 DIAGNOSIS — Z20822 Contact with and (suspected) exposure to covid-19: Secondary | ICD-10-CM

## 2019-11-01 DIAGNOSIS — M25319 Other instability, unspecified shoulder: Secondary | ICD-10-CM

## 2019-11-01 NOTE — Telephone Encounter (Signed)
Copied from [place] [id number]. Topic: Referral - Request for Referral >> Nov 01, 2019 12:18 PM Leward Quan A wrote: Has patient seen PCP for this complaint? Yes.   *If NO, is insurance requiring patient see PCP for this issue before PCP can refer them? Referral for which specialty: Orthopedic Preferred provider/office: None specified  Reason for referral: Problem with shoulder popping out of socket

## 2019-11-03 LAB — NOVEL CORONAVIRUS, NAA: SARS-CoV-2, NAA: NOT DETECTED

## 2019-11-07 ENCOUNTER — Other Ambulatory Visit: Payer: Self-pay

## 2019-11-07 ENCOUNTER — Ambulatory Visit: Payer: BC Managed Care – PPO | Admitting: Family Medicine

## 2019-11-07 ENCOUNTER — Encounter: Payer: Self-pay | Admitting: Family Medicine

## 2019-11-07 ENCOUNTER — Ambulatory Visit: Payer: Self-pay

## 2019-11-07 DIAGNOSIS — S43006A Unspecified dislocation of unspecified shoulder joint, initial encounter: Secondary | ICD-10-CM | POA: Diagnosis not present

## 2019-11-07 DIAGNOSIS — M25311 Other instability, right shoulder: Secondary | ICD-10-CM

## 2019-11-07 NOTE — Progress Notes (Signed)
Office Visit Note   Patient: Chris Watkins           Date of Birth: 12-14-1971           MRN: [id number] Visit Date: 11/07/2019 Requested by: Marrian Salvage, [place],  [place] [address] PCP: Marrian Salvage, FNP  Subjective: Chief Complaint  Patient presents with  . Right Shoulder - Pain    Shoulder dislocates a lot, since age 48. The first 3 times, he had to go to the hospital,but he has been able to put it back in place self ever since then. Ready to get the shoulder fixed.    HPI: He is here with right shoulder instability.  He dislocated his shoulder at age 39.  He had 3 dislocations as a teenager requiring reduction by a doctor.  Ever since then he has had dislocations that he was able to reduce on his own.  Over the years it has gotten more unstable.  Now it dislocates even in his sleep.  If he sits up and gets his shoulder to relax, it reduces.  It happens most frequently when he reaches laterally.  He has never been to physical therapy.  He has had a point where he would like to discuss surgical options.              ROS: No fevers or chills.  All other systems were reviewed and are negative.  Objective: Vital Signs: There were no vitals taken for this visit.  Physical Exam:  General:  Alert and oriented, in no acute distress. Pulm:  Breathing unlabored. Psy:  Normal mood, congruent affect. Skin: No rash. Right shoulder: He has guarding, will not ABduct past about 45 degrees.  Isometric rotator cuff testing strength is 5/5 and it does not cause pain.  He is not tender at the [facility] joint.  No tenderness at the long head biceps tendon.  Imaging: X-rays right shoulder: Anatomic alignment but he does have some early glenohumeral DJD.  He has mild to moderate AC joint DJD as well.  No sign of Hill-Sachs deformity or [place] lesion.  Assessment & Plan: 1.  Chronic recurrent right shoulder dislocation -Discussed options with him, he wants to pursue  surgical options so we will order MRI arthrogram and have him follow-up with Dr. Marlou Sa afterward.     Procedures: No procedures performed  No notes on file     PMFS History: Patient Active Problem List   Diagnosis Date Noted  . Eczema 06/16/2018  . Erectile dysfunction 02/25/2018  . Type 2 diabetes mellitus ([place]) 04/23/2015  . Routine general medical examination at a health care facility 04/23/2015  . HTN (hypertension) 04/09/2014   Past Medical History:  Diagnosis Date  . Chicken pox   . Diabetes mellitus without complication ([place])   . Hypertension     Family History  Problem Relation Age of Onset  . Thyroid disease Mother   . Hypertension Father   . Diabetes Father   . Diabetes Maternal Grandmother     Past Surgical History:  Procedure Laterality Date  . HERNIA REPAIR    . SLIPPED CAPITAL FEMORAL EPIPHYSIS PINNING     Social History   Occupational History  . Occupation: Facilities manager  Tobacco Use  . Smoking status: Former Smoker    Packs/day: 0.50    Types: Cigarettes    Quit date: 02/27/2018    Years since quitting: 1.6  . Smokeless tobacco: Never Used  Substance and Sexual Activity  . Alcohol use: Yes    Comment: Has not since he was diagnosed with DM in 01/2015  . Drug use: No  . Sexual activity: Not on file

## 2019-11-29 ENCOUNTER — Ambulatory Visit (INDEPENDENT_AMBULATORY_CARE_PROVIDER_SITE_OTHER): Payer: BC Managed Care – PPO | Admitting: Internal Medicine

## 2019-11-29 ENCOUNTER — Other Ambulatory Visit: Payer: Self-pay

## 2019-11-29 ENCOUNTER — Encounter: Payer: Self-pay | Admitting: Internal Medicine

## 2019-11-29 VITALS — BP 134/80 | HR 98 | Temp 98.1°F | Ht 71.0 in | Wt 253.0 lb

## 2019-11-29 DIAGNOSIS — I10 Essential (primary) hypertension: Secondary | ICD-10-CM | POA: Diagnosis not present

## 2019-11-29 DIAGNOSIS — M545 Low back pain, unspecified: Secondary | ICD-10-CM | POA: Insufficient documentation

## 2019-11-29 DIAGNOSIS — E1165 Type 2 diabetes mellitus with hyperglycemia: Secondary | ICD-10-CM | POA: Diagnosis not present

## 2019-11-29 MED ORDER — TRAMADOL HCL 50 MG PO TABS: 50.0000 mg | ORAL_TABLET | Freq: Four times a day (QID) | ORAL | 0 refills | Status: DC | PRN

## 2019-11-29 MED ORDER — CYCLOBENZAPRINE HCL 5 MG PO TABS: 5.0000 mg | ORAL_TABLET | Freq: Three times a day (TID) | ORAL | 1 refills | Status: DC | PRN

## 2019-11-29 MED ORDER — NAPROXEN 500 MG PO TABS: 500.0000 mg | ORAL_TABLET | Freq: Two times a day (BID) | ORAL | 2 refills | Status: DC | PRN

## 2019-11-29 NOTE — Progress Notes (Signed)
Subjective:    Patient ID: Chris Watkins, male    DOB: Jun 14, 1971, 48 y.o.   MRN: [id number]  HPI  Here with acute onset low back pain mon dec 1, started about noon, turned at the waist while sitting on the bed to see the TV but had acute onest pain to left lower back, severe, sharp, worse to put wt on the Left leg and couldn't hardly walk, pain worse to even try to stand up.  Pain better yesterday, but then suddenly had pain shifted to right lower back, and so severe this AM he couldn't go to work.  Does seem better at the moment as he has improved as the day goes on. Pt denies bowel or bladder change, fever, wt loss,  worsening LE pain/numbness/weakness, gait change or falls.  No prior hx of significant back pain; Sitting too long makes it get stiff again. Pt denies chest pain, increased sob or doe, wheezing, orthopnea, PND, increased LE swelling, palpitations, dizziness or syncope.  Pt denies new neurological symptoms such as new headache, or facial or extremity weakness or numbness   Pt denies polydipsia, polyuria Past Medical History:  Diagnosis Date  . Chicken pox   . Diabetes mellitus without complication ([place])   . Hypertension    Past Surgical History:  Procedure Laterality Date  . HERNIA REPAIR    . SLIPPED CAPITAL FEMORAL EPIPHYSIS PINNING      reports that he quit smoking about 21 months ago. His smoking use included cigarettes. He smoked 0.50 packs per day. He has never used smokeless tobacco. He reports current alcohol use. He reports that he does not use drugs. family history includes Diabetes in his father and maternal grandmother; Hypertension in his father; Thyroid disease in his mother. No Known Allergies Current Outpatient Medications on File Prior to Visit  Medication Sig Dispense Refill  . amLODipine (NORVASC) 10 MG tablet TAKE 1 TABLET BY MOUTH DAILY. 90 tablet 3  . blood glucose meter kit and supplies Dispense based on patient and insurance preference. Check fasting  blood sugar daily. Diagnosis: Diabetes E11.9 1 each 0  . empagliflozin (JARDIANCE) 10 MG TABS tablet Take 10 mg by mouth daily. 90 tablet 1  . glucose blood (FREESTYLE LITE) test strip Use to check blood sugars twice a day. Dx E11.9 100 each 3  . glucose monitoring kit (FREESTYLE) monitoring kit Use to check blood sugars Dx E11.9 1 each 0  . hydrochlorothiazide (HYDRODIURIL) 25 MG tablet Take 1 tablet (25 mg total) by mouth daily. 90 tablet 3  . Lancets (FREESTYLE) lancets Use to check blood sugars twice a day Dx E11.9 100 each 3  . metFORMIN (GLUCOPHAGE-XR) 500 MG 24 hr tablet Take 2 tablets in the am; take 1 in the pm 270 tablet 3  . quinapril (ACCUPRIL) 40 MG tablet TAKE 1 TABLET BY MOUTH EVERYDAY AT BEDTIME 90 tablet 3  . triamcinolone ointment (KENALOG) 0.5 % Apply 1 application topically 2 (two) times daily. 60 g 0   No current facility-administered medications on file prior to visit.    Review of Systems  Constitutional: Negative for other unusual diaphoresis or sweats HENT: Negative for ear discharge or swelling Eyes: Negative for other worsening visual disturbances Respiratory: Negative for stridor or other swelling  Gastrointestinal: Negative for worsening distension or other blood Genitourinary: Negative for retention or other urinary change Musculoskeletal: Negative for other MSK pain or swelling Skin: Negative for color change or other new lesions Neurological: Negative for worsening  tremors and other numbness  Psychiatric/Behavioral: Negative for worsening agitation or other fatigue All otherwise neg per pt     Objective:   Physical Exam BP 134/80   Pulse 98   Temp 98.1 F (36.7 C) (Oral)   Ht 5' 11" (1.803 m)   Wt 253 lb (114.8 kg)   SpO2 96%   BMI 35.29 kg/m  VS noted,  Constitutional: Pt appears in NAD HENT: Head: NCAT.  Right Ear: External ear normal.  Left Ear: External ear normal.  Eyes: . Pupils are equal, round, and reactive to light. Conjunctivae and  EOM are normal Nose: without d/c or deformity Neck: Neck supple. Gross normal ROM Cardiovascular: Normal rate and regular rhythm.   Pulmonary/Chest: Effort normal and breath sounds without rales or wheezing.  Abd:  Soft, NT, ND, + BS, no organomegaly Spine non tender; has left > right bilateral lumbar paravertebral spasm tender Neurological: Pt is alert. At baseline orientation, motor grossly intact Skin: Skin is warm. No rashes, other new lesions, no LE edema Psychiatric: Pt behavior is normal without agitation  All otherwise neg per pt Lab Results  Component Value Date   WBC 5.7 10/05/2019   HGB 13.9 10/05/2019   HCT 42.2 10/05/2019   PLT 203.0 10/05/2019   GLUCOSE 96 10/05/2019   CHOL 109 10/05/2019   TRIG 83.0 10/05/2019   HDL 29.30 (L) 10/05/2019   LDLCALC 64 10/05/2019   ALT 17 10/05/2019   AST 17 10/05/2019   NA 138 10/05/2019   K 3.5 10/05/2019   CL 101 10/05/2019   CREATININE 0.71 10/05/2019   BUN 15 10/05/2019   CO2 27 10/05/2019   TSH 1.99 02/24/2018   PSA 0.60 04/11/2019   HGBA1C 6.6 (H) 10/05/2019   MICROALBUR <0.7 09/22/2018         Assessment & Plan:   

## 2019-11-29 NOTE — Patient Instructions (Signed)
Please take all new medication as prescribed - the naproxyn anti-inflammatory twice per day as needed, the tramadol for pain if needed, and the muscle relaxer as needed  Please continue all other medications as before, and refills have been done if requested.  Please have the pharmacy call with any other refills you may need.  Please continue your efforts at being more active, low cholesterol diet, and weight control  Please keep your appointments with your specialists as you may have planned

## 2019-11-30 ENCOUNTER — Ambulatory Visit
Admission: RE | Admit: 2019-11-30 | Discharge: 2019-11-30 | Disposition: A | Discharge status: Home / Self Care | Payer: BC Managed Care – PPO | Source: Ambulatory Visit | Attending: Family Medicine | Admitting: Family Medicine

## 2019-11-30 DIAGNOSIS — S43006A Unspecified dislocation of unspecified shoulder joint, initial encounter: Secondary | ICD-10-CM

## 2019-11-30 DIAGNOSIS — M25311 Other instability, right shoulder: Secondary | ICD-10-CM

## 2019-11-30 DIAGNOSIS — M24411 Recurrent dislocation, right shoulder: Secondary | ICD-10-CM | POA: Diagnosis not present

## 2019-11-30 MED ORDER — IOPAMIDOL (ISOVUE-M 200) INJECTION 41%
15.0000 mL | Freq: Once | INTRAMUSCULAR | Status: AC
  Administered 2019-11-30: 15 mL via INTRA_ARTICULAR

## 2019-12-01 ENCOUNTER — Encounter: Payer: Self-pay | Admitting: Internal Medicine

## 2019-12-01 NOTE — Assessment & Plan Note (Signed)
stable overall by history and exam, recent data reviewed with pt, and pt to continue medical treatment as before,  to f/u any worsening symptoms or concerns  

## 2019-12-01 NOTE — Assessment & Plan Note (Signed)
C/w msk spasm, for pain control, muscle relaxer prn,  to f/u any worsening symptoms or concerns 

## 2019-12-03 ENCOUNTER — Telehealth: Payer: Self-pay | Admitting: Family Medicine

## 2019-12-03 NOTE — Telephone Encounter (Signed)
Can you please call and schedule appt with Dr Marlou Sa to discuss surgery per Dr Junius Roads. Thanks.

## 2019-12-03 NOTE — Telephone Encounter (Signed)
MRI confirms labrum cartilage tears and injury to the humeral head consistent with previous dislocations.  Rotator cuff does not look torn, which is good.  We will proceed with referral to Dr. Marlou Sa to discuss surgical options.

## 2020-01-01 ENCOUNTER — Telehealth: Payer: Self-pay

## 2020-01-01 ENCOUNTER — Telehealth: Payer: Self-pay | Admitting: Family

## 2020-01-01 NOTE — Telephone Encounter (Signed)
Copied from [place] [id number]. Topic: General - Inquiry >> Jan 01, 2020  2:27 PM Alease Frame wrote: Reason for CRM: Patient is needing a refill on the following medications :atorvastatin (LIPITOR) 10 MG tablet [id number]  DISCONTINUED  Please advise

## 2020-01-01 NOTE — Telephone Encounter (Signed)
At his last OV, he told me he stopped the Lipitor and had no interest in being on a cholesterol medication.

## 2020-01-01 NOTE — Telephone Encounter (Incomplete)
Medication Refill - Medication:  Has the patient contacted their pharmacy? {yes [id number]} (Agent: If no, request that the patient contact the pharmacy for the refill.) (Agent: If yes, when and what did the pharmacy advise?)  Preferred Pharmacy (with phone number or street name): ***  Agent: Please be advised that RX refills may take up to 3 business days. We ask that you follow-up with your pharmacy.  

## 2020-01-02 NOTE — Telephone Encounter (Signed)
Called and left message for patient to return call to clinic and info left about why medication was denied.

## 2020-01-04 ENCOUNTER — Other Ambulatory Visit: Payer: Self-pay | Admitting: Family

## 2020-01-04 ENCOUNTER — Telehealth: Payer: Self-pay

## 2020-01-04 DIAGNOSIS — E119 Type 2 diabetes mellitus without complications: Secondary | ICD-10-CM

## 2020-01-04 MED ORDER — ATORVASTATIN CALCIUM 20 MG PO TABS: 20.0000 mg | ORAL_TABLET | Freq: Every day | ORAL | 1 refills | Status: DC

## 2020-01-04 NOTE — Telephone Encounter (Signed)
Called and left message for patient to let me know if he wanted to resume cholesterol medication or not. Last visit patient stated he didn't wish to take any cholesterol mediation at this current time.

## 2020-01-09 ENCOUNTER — Telehealth: Payer: Self-pay | Admitting: Orthopedic Surgery

## 2020-01-09 NOTE — Telephone Encounter (Signed)
Called patient left message to return call to schedule an appointment with Dr Marlou Sa to discuss surgery

## 2020-01-21 ENCOUNTER — Encounter: Payer: Self-pay | Admitting: Orthopedic Surgery

## 2020-01-21 ENCOUNTER — Other Ambulatory Visit: Payer: Self-pay

## 2020-01-21 ENCOUNTER — Ambulatory Visit: Payer: BC Managed Care – PPO | Admitting: Orthopedic Surgery

## 2020-01-21 DIAGNOSIS — M25311 Other instability, right shoulder: Secondary | ICD-10-CM

## 2020-01-22 ENCOUNTER — Encounter: Payer: Self-pay | Admitting: Orthopedic Surgery

## 2020-01-22 NOTE — Progress Notes (Signed)
Office Visit Note   Patient: Chris Watkins           Date of Birth: 14-Feb-1971           MRN: [id number] Visit Date: 01/21/2020 Requested by: Marrian Salvage, [place],  [place] [address] PCP: Marrian Salvage, FNP  Subjective: Chief Complaint  Patient presents with  . Right Shoulder - Pain    HPI: Chris Watkins is a patient with right shoulder instability.  Has seen Dr. Junius Roads previously.  He describes initial dislocation at age 49.  He has had about 20 or 30 dislocations since.  He typically is able to reduce it on his own.  Does have history of diabetes but his hemoglobin A1c is 6.1.  He works in [place].  He has to be very careful with how he moves the arm because it is so unstable.  This is particularly the case for overhead motion.  It has come out when he has been sleeping before.  MRI scan is reviewed and it does show chronic anterior inferior labral detachment with no significant bone loss on the glenoid and only a mild Hill-Sachs lesion.  Rotator cuff is intact.              ROS: All systems reviewed are negative as they relate to the chief complaint within the history of present illness.  Patient denies  fevers or chills.   Assessment & Plan: Visit Diagnoses:  1. Instability of right shoulder joint     Plan: Impression is significant right shoulder instability.  The patient does not have too much in the way of arthritis in the shoulder yet.  Even though this is a chronic injury I think that the labral sleeve could be mobilized and reattached to the glenoid in a more favorable position to diminish dislocations.  That may come at a cost of some slight diminished range of motion.  Nonetheless I think he is a good candidate for right shoulder stabilization.  He is going to call when he wants to schedule.  Discussed with him the rehab involved in that which would be 3 weeks in a sling 3 weeks of motion below shoulder level followed by discontinuation of the  sling and initiation of strength training and overhead motion at 6 weeks.  All questions answered.  Follow-Up Instructions: Return if symptoms worsen or fail to improve.   Orders:  No orders of the defined types were placed in this encounter.  No orders of the defined types were placed in this encounter.     Procedures: No procedures performed   Clinical Data: No additional findings.  Objective: Vital Signs: There were no vitals taken for this visit.  Physical Exam:   Constitutional: Patient appears well-developed HEENT:  Head: Normocephalic Eyes:EOM are normal Neck: Normal range of motion Cardiovascular: Normal rate Pulmonary/chest: Effort normal Neurologic: Patient is alert Skin: Skin is warm Psychiatric: Patient has normal mood and affect    Ortho Exam: Ortho exam demonstrates full active and passive range of motion of the left shoulder.  On the right-hand side he has good deltoid strength and good infraspinatus supraspinatus and subscap strength but he really does not want to do any type of provocative testing and does not want to go overhead.  His external rotation is symmetric and I do not think he has a frozen shoulder.  No sulcus sign asymmetry right versus left.  No evidence of global instability in the right shoulder with  anterior and posterior shuck testing.  Specialty Comments:  No specialty comments available.  Imaging: No results found.   PMFS History: Patient Active Problem List   Diagnosis Date Noted  . Low back pain 11/29/2019  . Eczema 06/16/2018  . Erectile dysfunction 02/25/2018  . Type 2 diabetes mellitus ([place]) 04/23/2015  . Routine general medical examination at a health care facility 04/23/2015  . HTN (hypertension) 04/09/2014   Past Medical History:  Diagnosis Date  . Chicken pox   . Diabetes mellitus without complication ([place])   . Hypertension     Family History  Problem Relation Age of Onset  . Thyroid disease Mother   .  Hypertension Father   . Diabetes Father   . Diabetes Maternal Grandmother     Past Surgical History:  Procedure Laterality Date  . HERNIA REPAIR    . SLIPPED CAPITAL FEMORAL EPIPHYSIS PINNING     Social History   Occupational History  . Occupation: Facilities manager  Tobacco Use  . Smoking status: Former Smoker    Packs/day: 0.50    Types: Cigarettes    Quit date: 02/27/2018    Years since quitting: 1.9  . Smokeless tobacco: Never Used  Substance and Sexual Activity  . Alcohol use: Yes    Comment: Has not since he was diagnosed with DM in 01/2015  . Drug use: No  . Sexual activity: Not on file

## 2020-02-20 ENCOUNTER — Other Ambulatory Visit: Payer: Self-pay | Admitting: Family

## 2020-02-20 DIAGNOSIS — L309 Dermatitis, unspecified: Secondary | ICD-10-CM

## 2020-03-05 IMAGING — MR MR SHOULDER*R* W/CM
5 series · 40 of 40 positions shown · IV contrast (agent unspecified)
Comparison: None.

CLINICAL DATA: Chronic dislocations.

EXAM:
MR ARTHROGRAM OF THE right SHOULDER
TECHNIQUE: Multiplanar, multisequence MR imaging of the right shoulder was
performed following the administration of intra-articular contrast.
CONTRAST:  See Injection Documentation.

[Series 3: T1 fat-sat · axial · 4.0mm · 0.27mm/px · z∈[-63,+31]mm · 10 of 20 slices shown (1 of 3)]
[im 1/20]
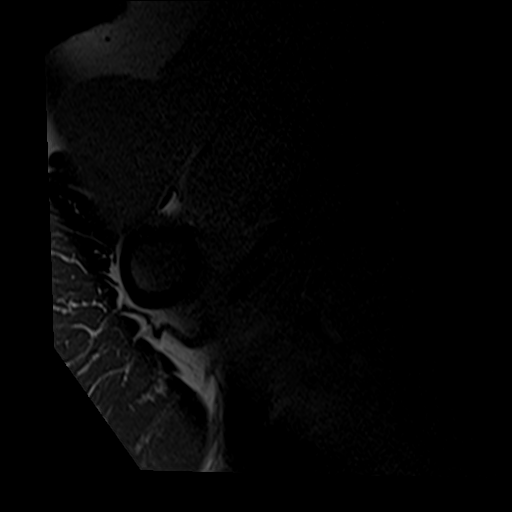
[im 3/20]
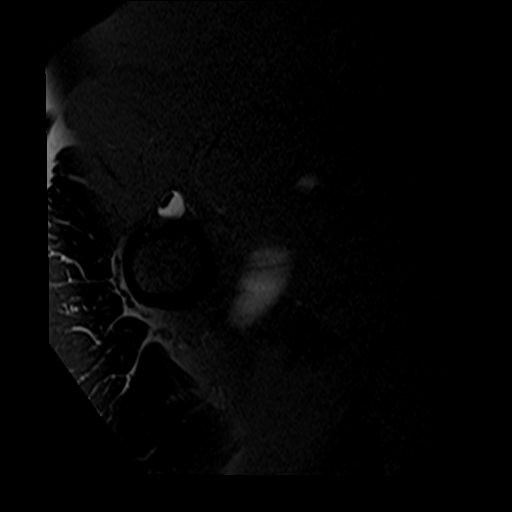
[im 5/20]
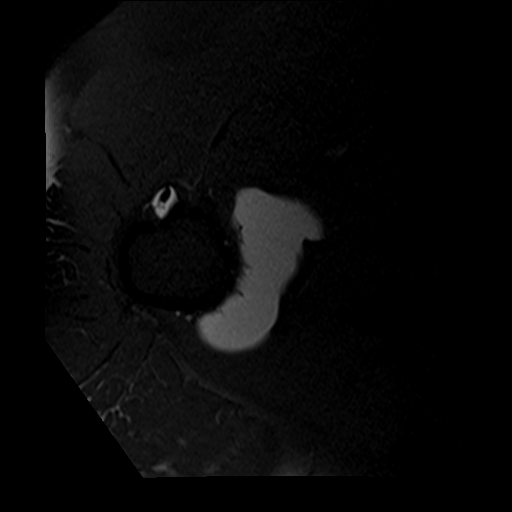
[im 7/20]
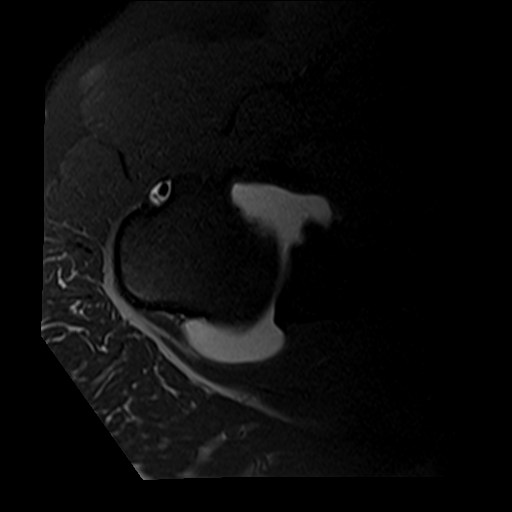
[im 9/20]
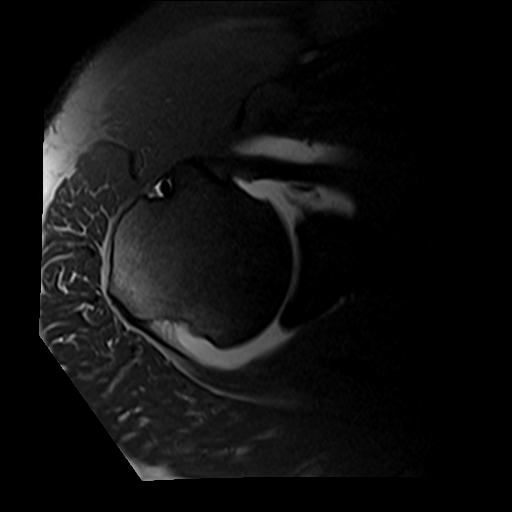
[im 11/20]
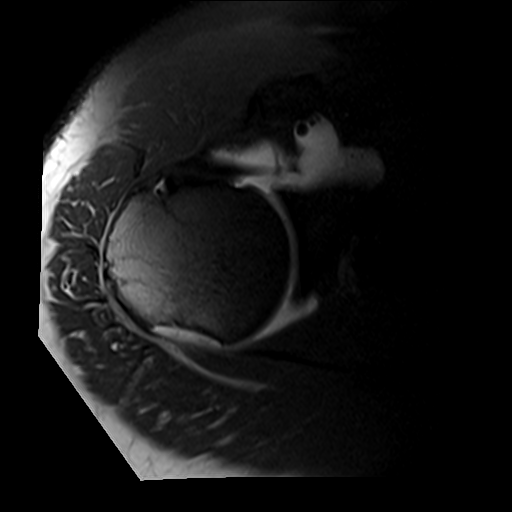
[im 13/20]
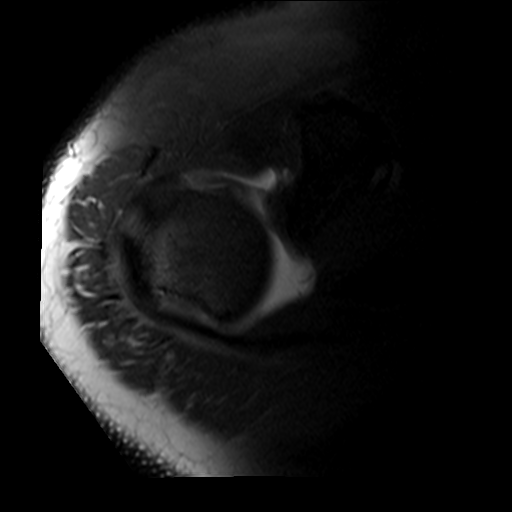
[im 15/20]
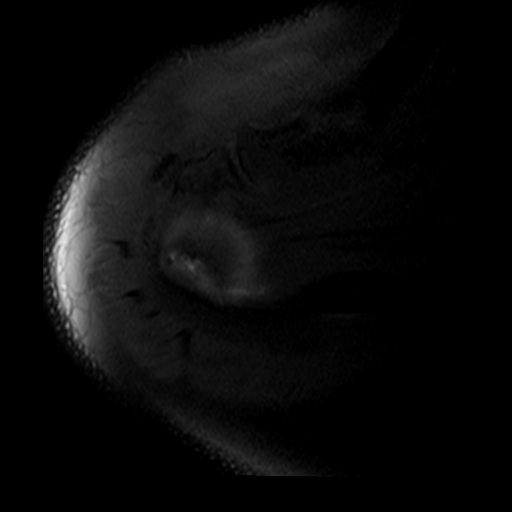
[im 17/20]
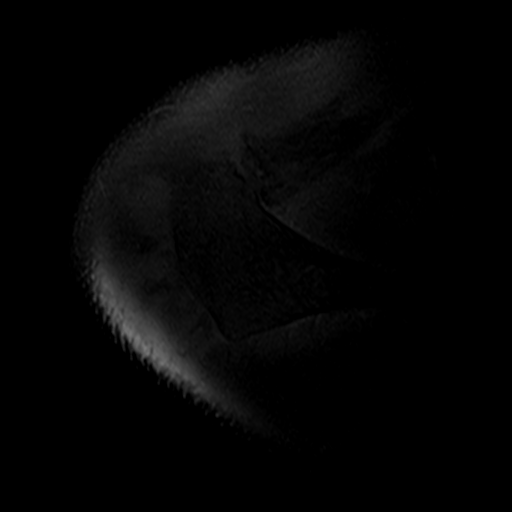
[im 20/20]
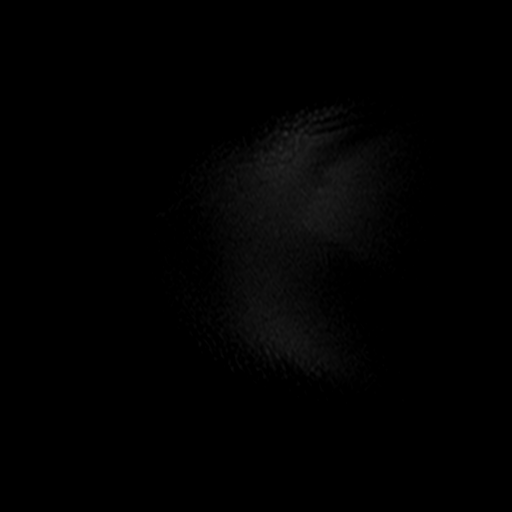

[Series 4: T2 fat-sat · coronal · 4.0mm · 0.55mm/px · 9 of 21 slices shown (1 of 2)]
[im 1/21]
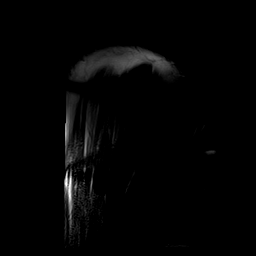
[im 3/21]
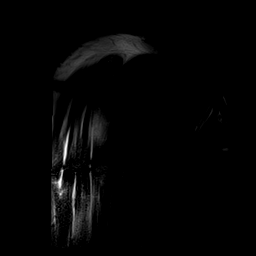
[im 6/21]
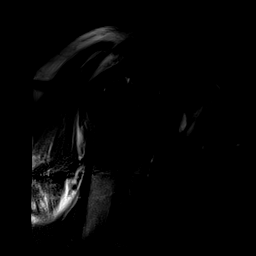
[im 8/21]
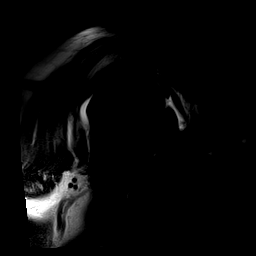
[im 11/21]
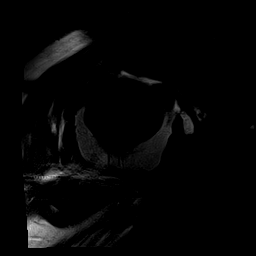
[im 13/21]
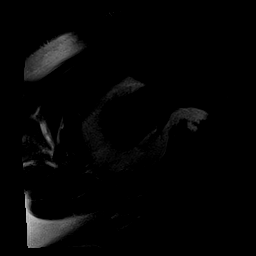
[im 16/21]
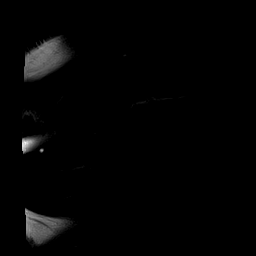
[im 18/21]
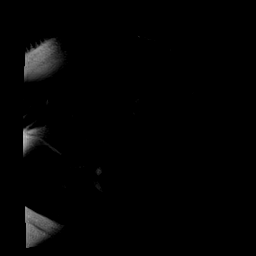
[im 21/21]
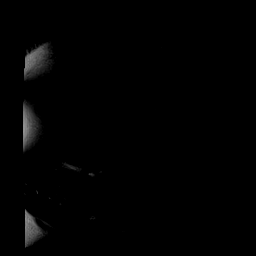

[Series 5: T1 fat-sat · oblique · 4.0mm · 0.55mm/px · 7 of 17 slices shown (2 of 3)]
[im 1/17]
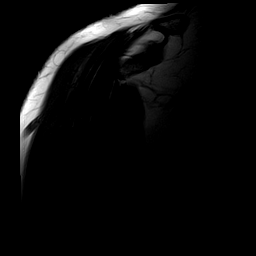
[im 3/17]
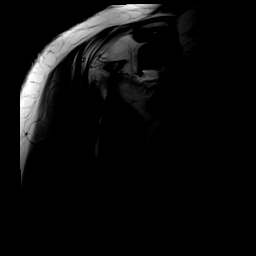
[im 6/17]
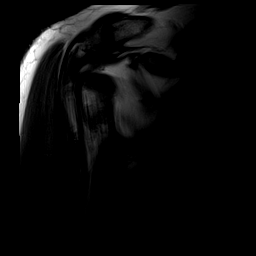
[im 9/17]
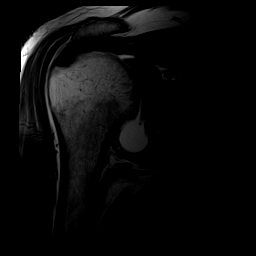
[im 11/17]
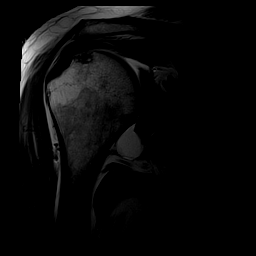
[im 14/17]
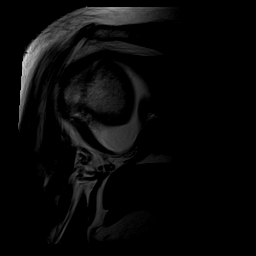
[im 17/17]
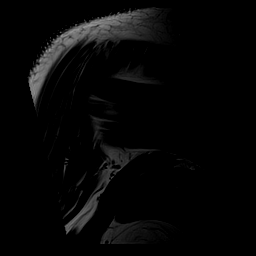

[Series 6: T1 fat-sat · oblique · 4.0mm · 0.55mm/px · 7 of 17 slices shown (3 of 3)]
[im 1/17]
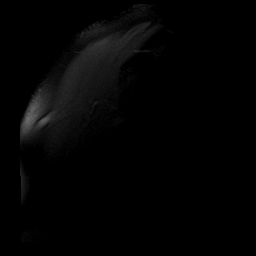
[im 3/17]
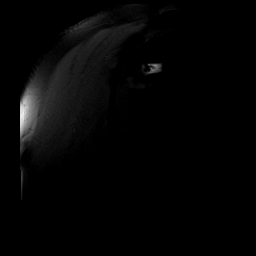
[im 6/17]
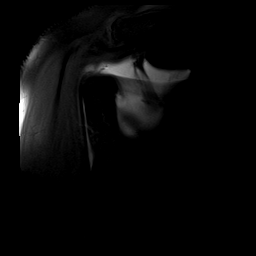
[im 9/17]
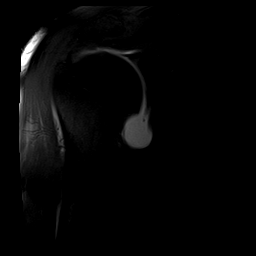
[im 11/17]
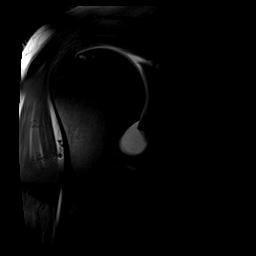
[im 14/17]
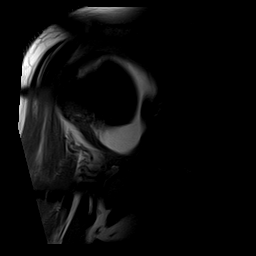
[im 17/17]
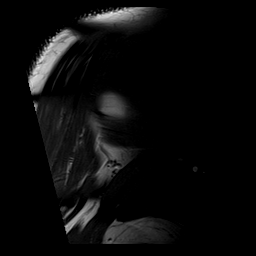

[Series 7: T2 fat-sat · oblique · 4.0mm · 0.55mm/px · 7 of 17 slices shown (2 of 2)]
[im 1/17]
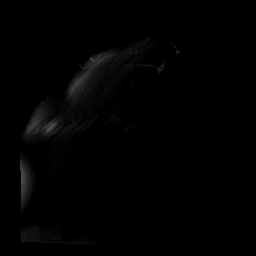
[im 3/17]
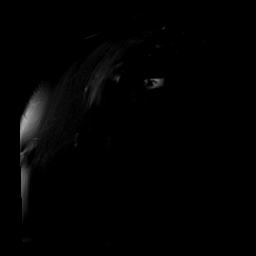
[im 6/17]
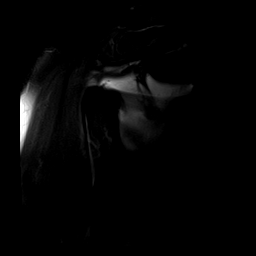
[im 9/17]
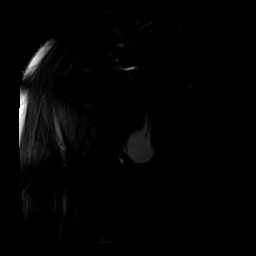
[im 11/17]
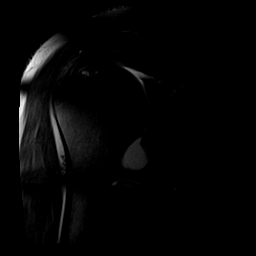
[im 14/17]
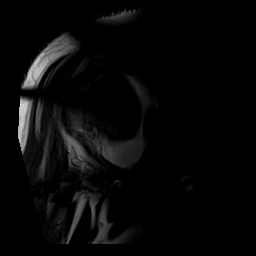
[im 17/17]
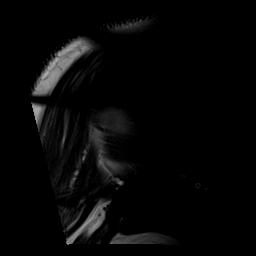

[40 of 40 positions shown; findings below may reference images not displayed]

FINDINGS: Exam is somewhat limited by patient motion.

Rotator cuff: Moderate rotator cuff tendinopathy/tendinosis but no
full-thickness retracted tear. No extravasation of contrast into the
subacromial/subdeltoid bursa.

Muscles: Mild diffuse fatty change involving all the shoulder
muscles.

Biceps long head: Intact.

Acromioclavicular Joint: Mild to moderate degenerative changes. The
acromion is type 2 in shape. No significant lateral downsloping but
there is undersurface spurring.

Glenohumeral Joint: Mild degenerative changes. No cartilage defects.
No loose bodies. Small amount of air in the joint that was
inadvertently injected.

Labrum: The anterior inferior labrum is torn and detached from the
glenoid. Could not exclude a small bony defect.

Bones: No acute fracture or bone lesion. Chronic Hill-Sachs
impaction type defect involving the posterior aspect of the humeral
head.
IMPRESSION: 1. Chronic Hill-Sachs impaction type defect involving the posterior
aspect of the humeral head.
2. Moderate rotator cuff tendinopathy/tendinosis but no
full-thickness retracted tear.
3. Torn and detached anterior inferior labrum. Could not exclude a
small bony defect. CT may be helpful for further evaluation if
surgery is considered.
4. Intact long head biceps tendon.

## 2020-03-05 IMAGING — XA DG ARTHROGRAM SHOULDER*R*
2 series · 2 of 2 positions shown · IV contrast (multihance)
Comparison: None.

CLINICAL DATA: Recurrent dislocations for many years.

EXAM:
ARTHROGRAM OF THE RIGHT SHOULDER
TECHNIQUE: The skin overlying the right shoulder joint was cleansed with
Betadine, draped in the usual sterile fashion, and infiltrated
locally with 1%Lidocaine. A 22 gauge spinal needle was advanced to
the inferomedial margin of the humeral head on one pass under
intermittent fluoroscopy. Seventeen ml of a mixture of 0.1 ml
Multihance 20 ml of dilute Isovue 200 was then used to fill the
right shoulder joint. No apparent complication. The patient was
taken immediately to MR.
FLUOROSCOPY TIME:  0 minutes 20 seconds. 19.31 micro gray meter
squared

[Series 1: ortho standard · 1 of 1 slices shown (1 of 2)]
[im 1/1]
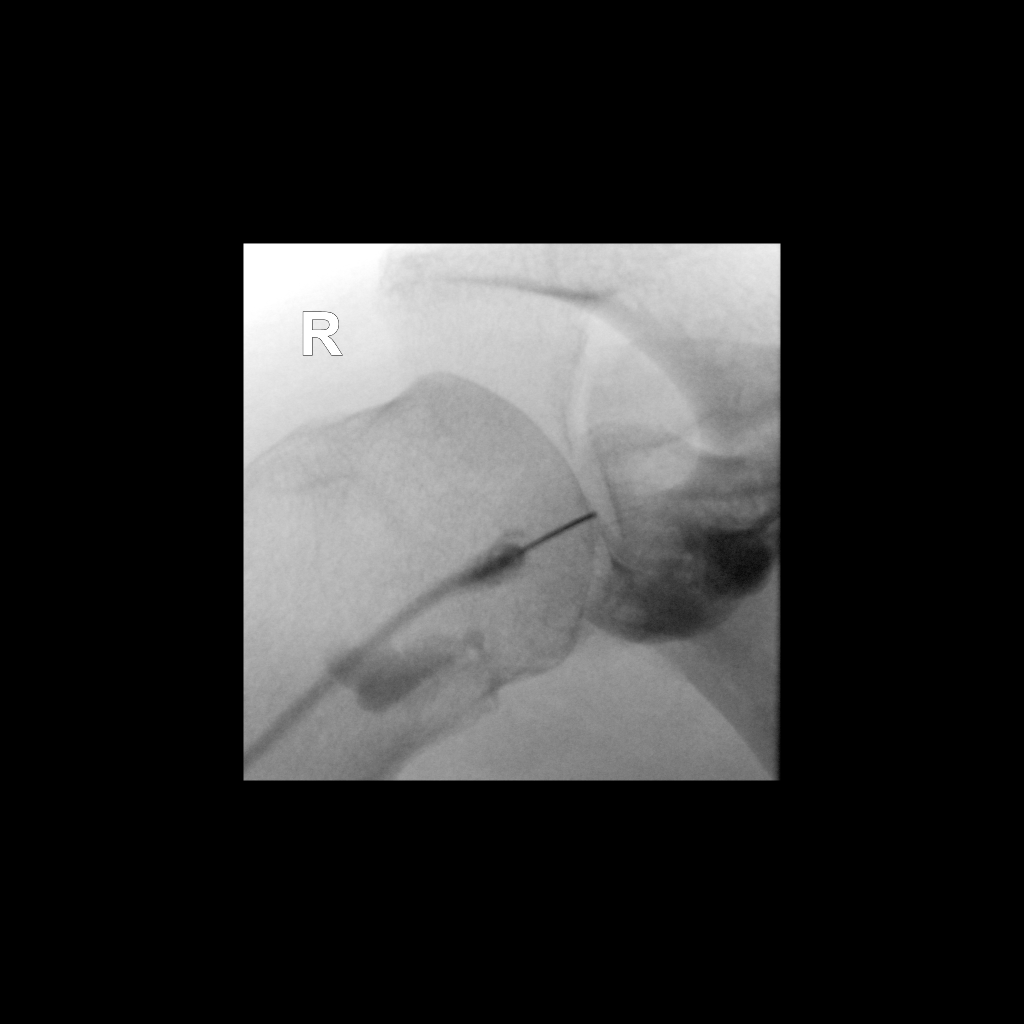

[Series 2: ortho standard · 1 of 1 slices shown (2 of 2)]
[im 1/1]
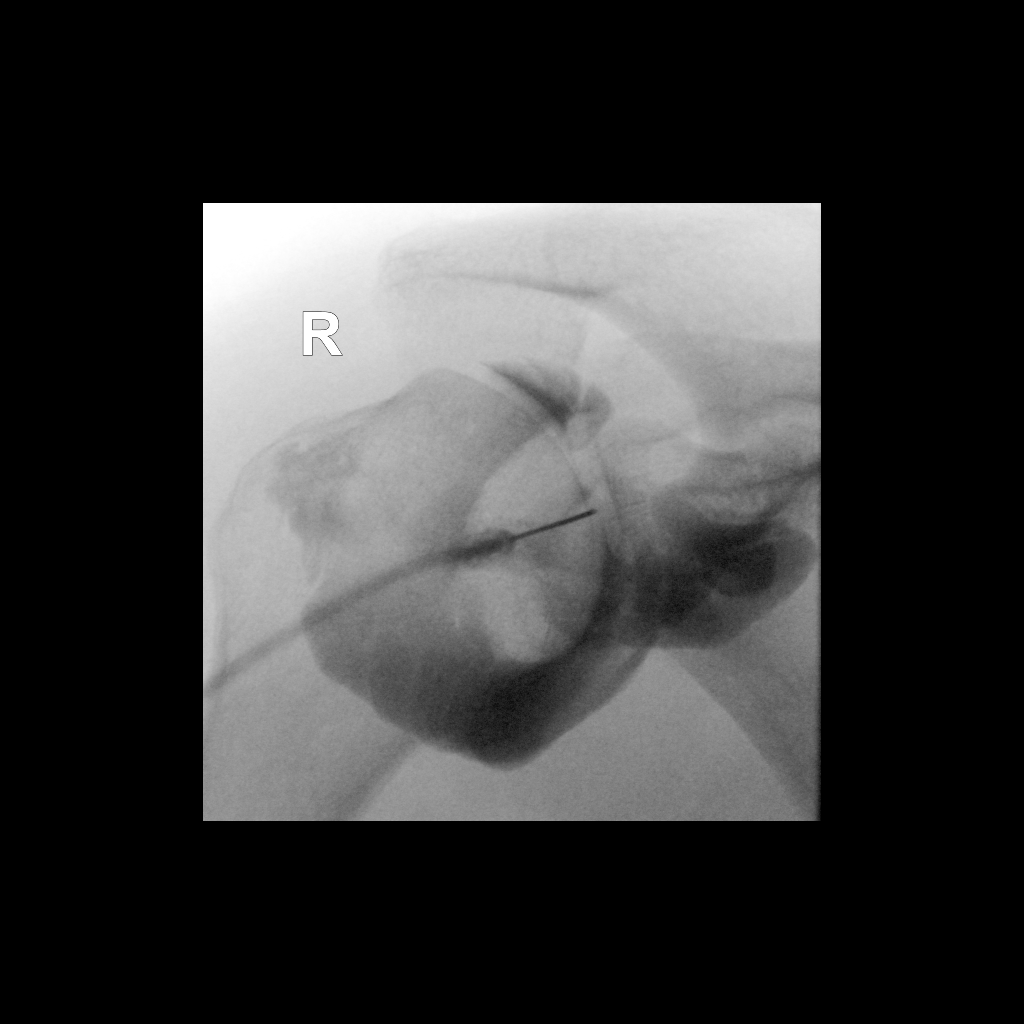

[2 of 2 positions shown; findings below may reference images not displayed]

IMPRESSION: Technically successful right shoulder injection for MRI.

## 2020-04-21 ENCOUNTER — Other Ambulatory Visit: Payer: Self-pay

## 2020-04-21 ENCOUNTER — Ambulatory Visit: Payer: BC Managed Care – PPO | Admitting: Family

## 2020-04-21 ENCOUNTER — Encounter: Payer: Self-pay | Admitting: Family

## 2020-04-21 VITALS — BP 138/68 | HR 98 | Temp 98.7°F | Wt 252.2 lb

## 2020-04-21 DIAGNOSIS — E782 Mixed hyperlipidemia: Secondary | ICD-10-CM

## 2020-04-21 DIAGNOSIS — Z1211 Encounter for screening for malignant neoplasm of colon: Secondary | ICD-10-CM

## 2020-04-21 DIAGNOSIS — E119 Type 2 diabetes mellitus without complications: Secondary | ICD-10-CM

## 2020-04-21 DIAGNOSIS — I10 Essential (primary) hypertension: Secondary | ICD-10-CM | POA: Diagnosis not present

## 2020-04-21 MED ORDER — FREESTYLE LANCETS MISC: 3 refills | Status: DC

## 2020-04-21 NOTE — Progress Notes (Signed)
Chris Watkins is a 49 y.o. male with the following history as recorded in Epic[employer]:  Patient Active Problem List   Diagnosis Date Noted  . Low back pain 11/29/2019  . Eczema 06/16/2018  . Erectile dysfunction 02/25/2018  . Type 2 diabetes mellitus ([place]) 04/23/2015  . Routine general medical examination at a health care facility 04/23/2015  . HTN (hypertension) 04/09/2014    Current Outpatient Medications  Medication Sig Dispense Refill  . amLODipine (NORVASC) 10 MG tablet TAKE 1 TABLET BY MOUTH DAILY. 90 tablet 3  . atorvastatin (LIPITOR) 20 MG tablet Take 1 tablet (20 mg total) by mouth daily. 90 tablet 1  . empagliflozin (JARDIANCE) 10 MG TABS tablet Take 10 mg by mouth daily. 90 tablet 1  . glucose blood (FREESTYLE LITE) test strip Use to check blood sugars twice a day. Dx E11.9 100 each 3  . hydrochlorothiazide (HYDRODIURIL) 25 MG tablet Take 1 tablet (25 mg total) by mouth daily. 90 tablet 3  . Lancets (FREESTYLE) lancets Use to check blood sugars twice a day Dx E11.9 100 each 3  . metFORMIN (GLUCOPHAGE-XR) 500 MG 24 hr tablet Take 2 tablets in the am; take 1 in the pm 270 tablet 3  . quinapril (ACCUPRIL) 40 MG tablet TAKE 1 TABLET BY MOUTH EVERYDAY AT BEDTIME 90 tablet 3  . triamcinolone ointment (KENALOG) 0.5 % APPLY TO AFFECTED AREA TWICE A DAY 60 g 0   No current facility-administered medications for this visit.    Allergies: Patient has no known allergies.  Past Medical History:  Diagnosis Date  . Chicken pox   . Diabetes mellitus without complication ([place])   . Hypertension     Past Surgical History:  Procedure Laterality Date  . HERNIA REPAIR    . SLIPPED CAPITAL FEMORAL EPIPHYSIS PINNING      Family History  Problem Relation Age of Onset  . Thyroid disease Mother   . Hypertension Father   . Diabetes Father   . Diabetes Maternal Grandmother     Social History   Tobacco Use  . Smoking status: Former Smoker    Packs/day: 0.50    Types: Cigarettes    Quit  date: 02/27/2018    Years since quitting: 2.1  . Smokeless tobacco: Never Used  Substance Use Topics  . Alcohol use: Yes    Comment: Has not since he was diagnosed with DM in 01/2015    Subjective:  6 month follow-up on Type 2 Diabetes/ hypertension/ hyperlipidemia; Denies any chest pain, shortness of breath, blurred vision or headache. Does check his blood pressure regularly and averaging 120/70; fasting blood sugar averages 115-120;  Planning to get a home exercise bike and continue working on weight loss goals;    Objective:  Vitals:   04/21/20 1559  BP: 138/68  Pulse: 98  Temp: 98.7 F (37.1 C)  TempSrc: Oral  SpO2: 96%  Weight: 252 lb 3.2 oz (114.4 kg)    General: Well developed, well nourished, in no acute distress  Skin : Warm and dry.  Head: Normocephalic and atraumatic  Lungs: Respirations unlabored; clear to auscultation bilaterally without wheeze, rales, rhonchi - there was an initial wheeze in right lower lobe but it cleared with cough;  CVS exam: normal rate and regular rhythm.  Neurologic: Alert and oriented; speech intact; face symmetrical; moves all extremities well; CNII-XII intact without focal deficit  Assessment:  1. Type 2 diabetes mellitus without complication, without long-term current use of insulin ([place])   2. Essential  hypertension   3. Mixed hyperlipidemia   4. Encounter for screening colonoscopy     Plan:  Congratulated patient on his commitment to his weight loss goals/ health; update labs today; he will have his pharmacy let us know when refills are due; Referral for baseline colonoscopy; encouraged to schedule for yearly eye exam; patient defers pneumonia vaccines at this time.  This visit occurred during the SARS-CoV-2 public health emergency.  Safety protocols were in place, including screening questions prior to the visit, additional usage of staff PPE, and extensive cleaning of exam room while observing appropriate contact time as indicated for  disinfecting solutions.     No follow-ups on file.  Orders Placed This Encounter  Procedures  . CBC with Differential/Platelet  . Comp Met (CMET)  . HgB A1c  . Lipid panel  . Ambulatory referral to Gastroenterology    Referral Priority:   Routine    Referral Type:   Consultation    Referral Reason:   Specialty Services Required    Number of Visits Requested:   1    Requested Prescriptions   Signed Prescriptions Disp Refills  . Lancets (FREESTYLE) lancets 100 each 3    Sig: Use to check blood sugars twice a day Dx E11.9

## 2020-04-22 LAB — LIPID PANEL
Cholesterol: 115 mg/dL (ref 0–200)
HDL: 28.2 mg/dL — ABNORMAL LOW (ref >39.00)
LDL Cholesterol: 71 mg/dL (ref 0–99)
NonHDL: 87.24
Total CHOL/HDL Ratio: 4
Triglycerides: 83 mg/dL (ref 0.0–149.0)
VLDL: 16.6 mg/dL (ref 0.0–40.0)

## 2020-04-22 LAB — CBC WITH DIFFERENTIAL/PLATELET
Basophils Absolute: 0 10*3/uL (ref 0.0–0.1)
Basophils Relative: 0.7 % (ref 0.0–3.0)
Eosinophils Absolute: 0.1 10*3/uL (ref 0.0–0.7)
Eosinophils Relative: 0.8 % (ref 0.0–5.0)
HCT: 42.5 % (ref 39.0–52.0)
Hemoglobin: 14.3 g/dL (ref 13.0–17.0)
Lymphocytes Relative: 28.2 % (ref 12.0–46.0)
Lymphs Abs: 1.8 10*3/uL (ref 0.7–4.0)
MCHC: 33.7 g/dL (ref 30.0–36.0)
MCV: 89.2 fl (ref 78.0–100.0)
Monocytes Absolute: 0.4 10*3/uL (ref 0.1–1.0)
Monocytes Relative: 6.9 % (ref 3.0–12.0)
Neutro Abs: 4.1 10*3/uL (ref 1.4–7.7)
Neutrophils Relative %: 63.4 % (ref 43.0–77.0)
Platelets: 218 10*3/uL (ref 150.0–400.0)
RBC: 4.76 Mil/uL (ref 4.22–5.81)
RDW: 14.2 % (ref 11.5–15.5)
WBC: 6.4 10*3/uL (ref 4.0–10.5)

## 2020-04-22 LAB — HEMOGLOBIN A1C: Hgb A1c MFr Bld: 6.5 % (ref 4.6–6.5)

## 2020-04-22 LAB — COMPREHENSIVE METABOLIC PANEL
ALT: 19 U/L (ref 0–53)
AST: 18 U/L (ref 0–37)
Albumin: 4.4 g/dL (ref 3.5–5.2)
Alkaline Phosphatase: 78 U/L (ref 39–117)
BUN: 18 mg/dL (ref 6–23)
CO2: 28 mEq/L (ref 19–32)
Calcium: 9.5 mg/dL (ref 8.4–10.5)
Chloride: 100 mEq/L (ref 96–112)
Creatinine, Ser: 0.92 mg/dL (ref 0.40–1.50)
GFR: 105.52 mL/min (ref >60.00)
Glucose, Bld: 100 mg/dL — ABNORMAL HIGH (ref 70–99)
Potassium: 3.7 mEq/L (ref 3.5–5.1)
Sodium: 137 mEq/L (ref 135–145)
Total Bilirubin: 0.4 mg/dL (ref 0.2–1.2)
Total Protein: 7.9 g/dL (ref 6.0–8.3)

## 2020-05-06 ENCOUNTER — Telehealth: Payer: Self-pay

## 2020-05-06 NOTE — Telephone Encounter (Signed)
New message    The patient is asking for 3 medications to be added back to his medication list due to spasm in his back also needs refills call in as well.   Cyclobenzaprine 5 mg   Naproxen 500 mg   Tramadol HCL 50 mg  [id number] - [place], [place] - [address] AT [address]

## 2020-05-07 MED ORDER — CYCLOBENZAPRINE HCL 5 MG PO TABS: 5.0000 mg | ORAL_TABLET | Freq: Three times a day (TID) | ORAL | 0 refills | Status: DC | PRN

## 2020-05-07 MED ORDER — NAPROXEN 500 MG PO TABS: 500.0000 mg | ORAL_TABLET | Freq: Two times a day (BID) | ORAL | 0 refills | Status: DC

## 2020-05-07 NOTE — Telephone Encounter (Signed)
I can send in the Naprosyn and Flexeril; he would need in office visit for Tramadol since I have never written for him.

## 2020-05-07 NOTE — Telephone Encounter (Signed)
Spoke with patient and info given. He did not want to set up office appointment and pay another copay for the Tramadol refill. Said he would just pick up the anti-inflammatory and muscle relaxer.

## 2020-05-07 NOTE — Telephone Encounter (Signed)
These medications are already on his medication list but we have not been writing/ managing for him. Who has been working with about this? Will need to get them to refill for him.

## 2020-07-02 ENCOUNTER — Other Ambulatory Visit: Payer: Self-pay | Admitting: Family

## 2020-07-02 DIAGNOSIS — E119 Type 2 diabetes mellitus without complications: Secondary | ICD-10-CM

## 2020-07-02 MED ORDER — EMPAGLIFLOZIN 10 MG PO TABS: 10.0000 mg | ORAL_TABLET | Freq: Every day | ORAL | 1 refills | Status: DC

## 2020-07-15 DIAGNOSIS — E119 Type 2 diabetes mellitus without complications: Secondary | ICD-10-CM | POA: Diagnosis not present

## 2020-07-15 LAB — HM DIABETES EYE EXAM

## 2020-07-16 ENCOUNTER — Encounter: Payer: Self-pay | Admitting: Family

## 2020-07-16 NOTE — Progress Notes (Signed)
Outside notes received. Information abstracted. Notes sent to scan.  

## 2020-07-18 ENCOUNTER — Encounter: Payer: Self-pay | Admitting: Family

## 2020-09-08 ENCOUNTER — Ambulatory Visit: Payer: BC Managed Care – PPO | Admitting: Family

## 2020-09-08 ENCOUNTER — Other Ambulatory Visit: Payer: Self-pay

## 2020-09-08 VITALS — BP 130/76 | HR 88 | Temp 98.0°F | Ht 71.0 in | Wt 248.8 lb

## 2020-09-08 DIAGNOSIS — I10 Essential (primary) hypertension: Secondary | ICD-10-CM

## 2020-09-08 DIAGNOSIS — Z125 Encounter for screening for malignant neoplasm of prostate: Secondary | ICD-10-CM

## 2020-09-08 DIAGNOSIS — E1165 Type 2 diabetes mellitus with hyperglycemia: Secondary | ICD-10-CM | POA: Diagnosis not present

## 2020-09-08 DIAGNOSIS — L309 Dermatitis, unspecified: Secondary | ICD-10-CM | POA: Diagnosis not present

## 2020-09-08 DIAGNOSIS — Z1211 Encounter for screening for malignant neoplasm of colon: Secondary | ICD-10-CM | POA: Diagnosis not present

## 2020-09-08 MED ORDER — TRIAMCINOLONE ACETONIDE 0.5 % EX OINT: TOPICAL_OINTMENT | CUTANEOUS | 1 refills | Status: DC

## 2020-09-08 NOTE — Progress Notes (Signed)
Chris Watkins is a 49 y.o. male with the following history as recorded in EpicCare:  Patient Active Problem List   Diagnosis Date Noted  . Low back pain 11/29/2019  . Eczema 06/16/2018  . Erectile dysfunction 02/25/2018  . Type 2 diabetes mellitus ([place]) 04/23/2015  . Routine general medical examination at a health care facility 04/23/2015  . HTN (hypertension) 04/09/2014    Current Outpatient Medications  Medication Sig Dispense Refill  . amLODipine (NORVASC) 10 MG tablet TAKE 1 TABLET BY MOUTH DAILY. 90 tablet 3  . atorvastatin (LIPITOR) 20 MG tablet TAKE 1 TABLET BY MOUTH EVERY DAY 90 tablet 1  . empagliflozin (JARDIANCE) 10 MG TABS tablet Take 1 tablet (10 mg total) by mouth daily. 90 tablet 1  . glucose blood (FREESTYLE LITE) test strip Use to check blood sugars twice a day. Dx E11.9 100 each 3  . hydrochlorothiazide (HYDRODIURIL) 25 MG tablet Take 1 tablet (25 mg total) by mouth daily. 90 tablet 3  . Lancets (FREESTYLE) lancets Use to check blood sugars twice a day Dx E11.9 100 each 3  . metFORMIN (GLUCOPHAGE-XR) 500 MG 24 hr tablet Take 2 tablets in the am; take 1 in the pm 270 tablet 3  . quinapril (ACCUPRIL) 40 MG tablet TAKE 1 TABLET BY MOUTH EVERYDAY AT BEDTIME 90 tablet 3  . triamcinolone ointment (KENALOG) 0.5 % APPLY TO AFFECTED AREA TWICE A DAY 60 g 1   No current facility-administered medications for this visit.    Allergies: Patient has no known allergies.  Past Medical History:  Diagnosis Date  . Chicken pox   . Diabetes mellitus without complication ([place])   . Hypertension     Past Surgical History:  Procedure Laterality Date  . HERNIA REPAIR    . SLIPPED CAPITAL FEMORAL EPIPHYSIS PINNING      Family History  Problem Relation Age of Onset  . Thyroid disease Mother   . Hypertension Father   . Diabetes Father   . Diabetes Maternal Grandmother     Social History   Tobacco Use  . Smoking status: Former Smoker    Packs/day: 0.50    Types: Cigarettes     Quit date: 02/27/2018    Years since quitting: 2.5  . Smokeless tobacco: Never Used  Substance Use Topics  . Alcohol use: Yes    Comment: Has not since he was diagnosed with DM in 01/2015    Subjective:  Follow-up on chronic care needs including; 1) Hypertension; 2) Type 2 Diabetes; 3) Hyperlipidemia; Denies any chest pain, shortness of breath, blurred vision or headache. Has lost 4 pounds since last OV;    Objective:  Vitals:   09/08/20 1452  BP: 130/76  Pulse: 88  Temp: 98 F (36.7 C)  TempSrc: Oral  SpO2: 99%  Weight: 248 lb 12.8 oz (112.9 kg)  Height: _0  (1.803 m)    General: Well developed, well nourished, in no acute distress  Skin : Warm and dry.  Head: Normocephalic and atraumatic  Lungs: Respirations unlabored; clear to auscultation bilaterally without wheeze, rales, rhonchi  CVS exam: normal rate and regular rhythm.  Musculoskeletal: No deformities; no active joint inflammation  Extremities: No edema, cyanosis, clubbing  Vessels: Symmetric bilaterally  Neurologic: Alert and oriented; speech intact; face symmetrical; moves all extremities well; CNII-XII intact without focal deficit    Assessment:  1. Type 2 diabetes mellitus with hyperglycemia, without long-term current use of insulin ([place])   2. Encounter for screening colonoscopy  3. Prostate cancer screening   4. Eczema, unspecified type   5. Essential hypertension     Plan:  1. Check Hgba1c today; will most likely plan to lower dosage of Metformin based on weight loss; 2. Order updated; 3. Check PSA today; 4. Refill updated; 5. Stable; continue same medication;  Will get flu shot through his employer; follow-up in 6 months, sooner prn.  This visit occurred during the SARS-CoV-2 public health emergency.  Safety protocols were in place, including screening questions prior to the visit, additional usage of staff PPE, and extensive cleaning of exam room while observing appropriate contact time as  indicated for disinfecting solutions.     No follow-ups on file.  Orders Placed This Encounter  Procedures  . Comp Met (CMET)    Standing Status:   Future    Number of Occurrences:   1    Standing Expiration Date:   09/08/2021  . Lipid panel    Standing Status:   Future    Number of Occurrences:   1    Standing Expiration Date:   09/08/2021  . Hemoglobin A1c    Standing Status:   Future    Number of Occurrences:   1    Standing Expiration Date:   09/08/2021  . PSA    Standing Status:   Future    Number of Occurrences:   1    Standing Expiration Date:   09/08/2021  . Ambulatory referral to Gastroenterology    Referral Priority:   Routine    Referral Type:   Consultation    Referral Reason:   Specialty Services Required    Number of Visits Requested:   1    Requested Prescriptions   Signed Prescriptions Disp Refills  . triamcinolone ointment (KENALOG) 0.5 % 60 g 1    Sig: APPLY TO AFFECTED AREA TWICE A DAY

## 2020-09-09 LAB — COMPREHENSIVE METABOLIC PANEL
AG Ratio: 1.6 (calc) (ref 1.0–2.5)
ALT: 16 U/L (ref 9–46)
AST: 12 U/L (ref 10–35)
Albumin: 4.5 g/dL (ref 3.6–5.1)
Alkaline phosphatase (APISO): 76 U/L (ref 35–144)
BUN: 20 mg/dL (ref 7–25)
CO2: 28 mmol/L (ref 20–32)
Calcium: 9.6 mg/dL (ref 8.6–10.3)
Chloride: 101 mmol/L (ref 98–110)
Creat: 0.82 mg/dL (ref 0.70–1.33)
Globulin: 2.9 g/dL (calc) (ref 1.9–3.7)
Glucose, Bld: 83 mg/dL (ref 65–99)
Potassium: 4.1 mmol/L (ref 3.5–5.3)
Sodium: 139 mmol/L (ref 135–146)
Total Bilirubin: 0.4 mg/dL (ref 0.2–1.2)
Total Protein: 7.4 g/dL (ref 6.1–8.1)

## 2020-09-09 LAB — LIPID PANEL
Cholesterol: 118 mg/dL (ref <200)
HDL: 33 mg/dL — ABNORMAL LOW (ref >=40)
LDL Cholesterol (Calc): 70 mg/dL (calc)
Non-HDL Cholesterol (Calc): 85 mg/dL (calc) (ref <130)
Total CHOL/HDL Ratio: 3.6 (calc) (ref <5.0)
Triglycerides: 72 mg/dL (ref <150)

## 2020-09-09 LAB — HEMOGLOBIN A1C
Hgb A1c MFr Bld: 6.1 % of total Hgb — ABNORMAL HIGH (ref <5.7)
Mean Plasma Glucose: 128 (calc)
eAG (mmol/L): 7.1 (calc)

## 2020-09-09 LAB — PSA: PSA: 0.47 ng/mL (ref <=4.0)

## 2020-09-12 ENCOUNTER — Encounter: Payer: Self-pay | Admitting: Gastroenterology

## 2020-10-11 ENCOUNTER — Ambulatory Visit: Payer: BC Managed Care – PPO | Attending: Internal Medicine

## 2020-10-11 DIAGNOSIS — Z23 Encounter for immunization: Secondary | ICD-10-CM

## 2020-10-11 NOTE — Progress Notes (Signed)
   Covid-19 Vaccination Clinic  Name:  KYI ROMANELLO    MRN: [id number] DOB: Dec 16, 1971  10/11/2020  Mr. Splawn was observed post Covid-19 immunization for 15 minutes without incident. He was provided with Vaccine Information Sheet and instruction to access the V-Safe system.   Mr. Marchena was instructed to call 911 with any severe reactions post vaccine: Marland Kitchen Difficulty breathing  . Swelling of face and throat  . A fast heartbeat  . A bad rash all over body  . Dizziness and weakness

## 2020-10-24 ENCOUNTER — Other Ambulatory Visit: Payer: Self-pay

## 2020-10-24 ENCOUNTER — Ambulatory Visit (AMBULATORY_SURGERY_CENTER): Payer: Self-pay | Admitting: *Deleted

## 2020-10-24 VITALS — Ht 71.0 in | Wt 247.4 lb

## 2020-10-24 DIAGNOSIS — Z1211 Encounter for screening for malignant neoplasm of colon: Secondary | ICD-10-CM

## 2020-10-24 MED ORDER — PLENVU 140 G PO SOLR: 1.0000 | Freq: Once | ORAL | 0 refills | Status: AC

## 2020-10-24 NOTE — Progress Notes (Signed)
Completed covid vaccines 04-01-20  Pt is aware that care partner will wait in the car during procedure; if they feel like they will be too hot or cold to wait in the car; they may wait in the 4 th floor lobby. Patient is aware to bring only one care partner. We want them to wear a mask (we do not have any that we can provide them), practice social distancing, and we will check their temperatures when they get here.  I did remind the patient that their care partner needs to stay in the parking lot the entire time and have a cell phone available, we will call them when the pt is ready for discharge. Patient will wear mask into building.   No trouble with anesthesia, difficulty with moving neck or hx/fam hx of malignant hyperthermia per pt   No egg or soy allergy  No home oxygen use   No medications for weight loss taken  emmi information given  Pt denies constipation issues  Plenvu coupon given and code put into RX

## 2020-10-31 ENCOUNTER — Encounter: Payer: Self-pay | Admitting: Gastroenterology

## 2020-11-07 ENCOUNTER — Other Ambulatory Visit: Payer: Self-pay | Admitting: Family

## 2020-11-07 ENCOUNTER — Encounter: Payer: BC Managed Care – PPO | Admitting: Gastroenterology

## 2020-11-07 DIAGNOSIS — I10 Essential (primary) hypertension: Secondary | ICD-10-CM

## 2020-11-08 ENCOUNTER — Other Ambulatory Visit: Payer: Self-pay | Admitting: Family

## 2020-11-08 DIAGNOSIS — I10 Essential (primary) hypertension: Secondary | ICD-10-CM

## 2020-11-18 ENCOUNTER — Other Ambulatory Visit: Payer: Self-pay | Admitting: Family

## 2020-11-18 MED ORDER — METFORMIN HCL ER 500 MG PO TB24: ORAL_TABLET | ORAL | 3 refills | Status: DC

## 2020-11-28 ENCOUNTER — Encounter: Payer: Self-pay | Admitting: Gastroenterology

## 2020-11-28 ENCOUNTER — Ambulatory Visit (AMBULATORY_SURGERY_CENTER): Payer: BC Managed Care – PPO | Admitting: Gastroenterology

## 2020-11-28 ENCOUNTER — Other Ambulatory Visit: Payer: Self-pay

## 2020-11-28 VITALS — BP 121/79 | HR 67 | Temp 98.0°F | Resp 17 | Ht 71.0 in | Wt 247.4 lb

## 2020-11-28 DIAGNOSIS — Z1211 Encounter for screening for malignant neoplasm of colon: Secondary | ICD-10-CM

## 2020-11-28 DIAGNOSIS — D120 Benign neoplasm of cecum: Secondary | ICD-10-CM | POA: Diagnosis not present

## 2020-11-28 DIAGNOSIS — D125 Benign neoplasm of sigmoid colon: Secondary | ICD-10-CM | POA: Diagnosis not present

## 2020-11-28 DIAGNOSIS — D123 Benign neoplasm of transverse colon: Secondary | ICD-10-CM | POA: Diagnosis not present

## 2020-11-28 DIAGNOSIS — D128 Benign neoplasm of rectum: Secondary | ICD-10-CM | POA: Diagnosis not present

## 2020-11-28 MED ORDER — SODIUM CHLORIDE 0.9 % IV SOLN: 500.0000 mL | Freq: Once | INTRAVENOUS | Status: DC

## 2020-11-28 NOTE — Op Note (Signed)
[place] Patient Name: Chris Watkins Procedure Date: 11/28/2020 1:23 PM MRN: [id number] Endoscopist: Ladene Artist , MD Age: 49 Referring MD:  Date of Birth: 1971/01/16 Gender: Male Account #: [id number] Procedure:                Colonoscopy Indications:              Screening for colorectal malignant neoplasm Medicines:                Monitored Anesthesia Care Procedure:                Pre-Anesthesia Assessment:                           - Prior to the procedure, a History and Physical                            was performed, and patient medications and                            allergies were reviewed. The patient's tolerance of                            previous anesthesia was also reviewed. The risks                            and benefits of the procedure and the sedation                            options and risks were discussed with the patient.                            All questions were answered, and informed consent                            was obtained. Prior Anticoagulants: The patient has                            taken no previous anticoagulant or antiplatelet                            agents. ASA Grade Assessment: II - A patient with                            mild systemic disease. After reviewing the risks                            and benefits, the patient was deemed in                            satisfactory condition to undergo the procedure.                           After obtaining informed consent, the colonoscope  was passed under direct vision. Throughout the                            procedure, the patient's blood pressure, pulse, and                            oxygen saturations were monitored continuously. The                            Colonoscope was introduced through the anus and                            advanced to the the cecum, identified by                            appendiceal orifice and  ileocecal valve. The                            ileocecal valve, appendiceal orifice, and rectum                            were photographed. The quality of the bowel                            preparation was excellent. The colonoscopy was                            performed without difficulty. The patient tolerated                            the procedure well. Scope In: 1:35:08 PM Scope Out: 1:57:19 PM Scope Withdrawal Time: 0 hours 19 minutes 9 seconds  Total Procedure Duration: 0 hours 22 minutes 11 seconds  Findings:                 The perianal and digital rectal examinations were                            normal. Pertinent negatives include normal                            sphincter tone.                           Four sessile polyps were found in the rectum,                            sigmoid colon, transverse colon and cecum. The                            polyps were 5 to 10 mm in size. These polyps were                            removed with a cold snare. Resection and retrieval  were complete.                           Scattered small-mouthed diverticula were found in                            the entire colon. More concentrated in the left                            colon. There was no evidence of diverticular                            bleeding.                           Internal hemorrhoids were found during                            retroflexion. The hemorrhoids were small and Grade                            I (internal hemorrhoids that do not prolapse).                           The exam was otherwise without abnormality on                            direct and retroflexion views. Complications:            No immediate complications. Estimated blood loss:                            None. Estimated Blood Loss:     Estimated blood loss: none. Impression:               - Four 5 to 10 mm polyps in the rectum, in the                             sigmoid colon, in the transverse colon and in the                            cecum, removed with a cold snare. Resected and                            retrieved.                           - Mild diverticulosis in the entire examined colon.                           - Internal hemorrhoids.                           - The examination was otherwise normal on direct  and retroflexion views. Recommendation:           - Repeat colonoscopy after studies are complete for                            surveillance based on pathology results.                           - Patient has a contact number available for                            emergencies. The signs and symptoms of potential                            delayed complications were discussed with the                            patient. Return to normal activities tomorrow.                            Written discharge instructions were provided to the                            patient.                           - High fiber diet.                           - Continue present medications.                           - Await pathology results. Ladene Artist, MD 11/28/2020 2:02:12 PM This report has been signed electronically.

## 2020-11-28 NOTE — Progress Notes (Signed)
Called to room to assist during endoscopic procedure.  Patient ID and intended procedure confirmed with present staff. Received instructions for my participation in the procedure from the performing physician.  

## 2020-11-28 NOTE — Progress Notes (Signed)
VS- Chris Watkins  Pt's states no medical or surgical changes since previsit or office visit.  

## 2020-11-28 NOTE — Patient Instructions (Signed)
HANDOUTS PROVIDED ON: HIGH FIBER DIET, POLYPS, DIVERTICULOSIS, & HEMORRHOIDS  The polyps removed today have been sent for pathology.  The results can take 1-3 weeks to receive.  When your next colonoscopy should occur will be based on the pathology results.    You may resume your previous diet and medication schedule.  Thank you for allowing [place] to care for you today!!!   YOU HAD AN ENDOSCOPIC PROCEDURE TODAY AT [facility]:   Refer to the procedure report that was given to you for any specific questions about what was found during the examination.  If the procedure report does not answer your questions, please call your gastroenterologist to clarify.  If you requested that your care partner not be given the details of your procedure findings, then the procedure report has been included in a sealed envelope for you to review at your convenience later.  YOU SHOULD EXPECT: Some feelings of bloating in the abdomen. Passage of more gas than usual.  Walking can help get rid of the air that was put into your GI tract during the procedure and reduce the bloating. If you had a lower endoscopy (such as a colonoscopy or flexible sigmoidoscopy) you may notice spotting of blood in your stool or on the toilet paper. If you underwent a bowel prep for your procedure, you may not have a normal bowel movement for a few days.  Please Note:  You might notice some irritation and congestion in your nose or some drainage.  This is from the oxygen used during your procedure.  There is no need for concern and it should clear up in a day or so.  SYMPTOMS TO REPORT IMMEDIATELY:   Following lower endoscopy (colonoscopy or flexible sigmoidoscopy):  Excessive amounts of blood in the stool  Significant tenderness or worsening of abdominal pains  Swelling of the abdomen that is new, acute  Fever of 100F or higher  For urgent or emergent issues, a gastroenterologist can be reached at any hour by calling  [phone]. Do not use MyChart messaging for urgent concerns.    DIET:  We do recommend a small meal at first, but then you may proceed to your regular diet.  Drink plenty of fluids but you should avoid alcoholic beverages for 24 hours.  ACTIVITY:  You should plan to take it easy for the rest of today and you should NOT DRIVE or use heavy machinery until tomorrow (because of the sedation medicines used during the test).    FOLLOW UP: Our staff will call the number listed on your records Tuesday morning between 7:15 am and 8:15 am to check on you and address any questions or concerns that you may have regarding the information given to you following your procedure. If we do not reach you, we will leave a message.  We will attempt to reach you two times.  During this call, we will ask if you have developed any symptoms of COVID 19. If you develop any symptoms (ie: fever, flu-like symptoms, shortness of breath, cough etc.) before then, please call [phone].  If you test positive for Covid 19 in the 2 weeks post procedure, please call and report this information to [place].    If any biopsies were taken you will be contacted by phone or by letter within the next 1-3 weeks.  Please call us at [phone] if you have not heard about the biopsies in 3 weeks.    SIGNATURES/CONFIDENTIALITY: You and/or your care  partner have signed paperwork which will be entered into your electronic medical record.  These signatures attest to the fact that that the information above on your After Visit Summary has been reviewed and is understood.  Full responsibility of the confidentiality of this discharge information lies with you and/or your care-partner.

## 2020-12-02 ENCOUNTER — Telehealth: Payer: Self-pay | Admitting: *Deleted

## 2020-12-02 NOTE — Telephone Encounter (Signed)
  Follow up Call-  Call back number 11/28/2020  Post procedure Call Back phone  # [phone]  Permission to leave phone message Yes  Some recent data might be hidden     No answer at 2nd attempt follow up phone call.  Left message on voicemail.

## 2020-12-02 NOTE — Telephone Encounter (Signed)
Follow up call made. 

## 2020-12-11 ENCOUNTER — Other Ambulatory Visit: Payer: Self-pay | Admitting: Family

## 2020-12-11 DIAGNOSIS — E119 Type 2 diabetes mellitus without complications: Secondary | ICD-10-CM

## 2020-12-15 ENCOUNTER — Encounter: Payer: Self-pay | Admitting: Gastroenterology

## 2021-01-02 ENCOUNTER — Other Ambulatory Visit: Payer: Self-pay | Admitting: Family

## 2021-01-02 DIAGNOSIS — E119 Type 2 diabetes mellitus without complications: Secondary | ICD-10-CM

## 2021-03-27 ENCOUNTER — Encounter: Payer: Self-pay | Admitting: Family

## 2021-03-27 ENCOUNTER — Ambulatory Visit: Payer: BC Managed Care – PPO | Admitting: Family

## 2021-03-27 ENCOUNTER — Other Ambulatory Visit: Payer: Self-pay | Admitting: Family

## 2021-03-27 ENCOUNTER — Other Ambulatory Visit: Payer: Self-pay

## 2021-03-27 VITALS — BP 124/76 | HR 88 | Temp 98.0°F | Ht 71.0 in | Wt 250.6 lb

## 2021-03-27 DIAGNOSIS — E119 Type 2 diabetes mellitus without complications: Secondary | ICD-10-CM

## 2021-03-27 DIAGNOSIS — I10 Essential (primary) hypertension: Secondary | ICD-10-CM

## 2021-03-27 DIAGNOSIS — E782 Mixed hyperlipidemia: Secondary | ICD-10-CM

## 2021-03-27 LAB — COMPREHENSIVE METABOLIC PANEL
ALT: 19 U/L (ref 0–53)
AST: 18 U/L (ref 0–37)
Albumin: 4.3 g/dL (ref 3.5–5.2)
Alkaline Phosphatase: 77 U/L (ref 39–117)
BUN: 13 mg/dL (ref 6–23)
CO2: 28 mEq/L (ref 19–32)
Calcium: 9.3 mg/dL (ref 8.4–10.5)
Chloride: 103 mEq/L (ref 96–112)
Creatinine, Ser: 0.63 mg/dL (ref 0.40–1.50)
GFR: 110.86 mL/min (ref >60.00)
Glucose, Bld: 95 mg/dL (ref 70–99)
Potassium: 3.4 mEq/L — ABNORMAL LOW (ref 3.5–5.1)
Sodium: 139 mEq/L (ref 135–145)
Total Bilirubin: 0.6 mg/dL (ref 0.2–1.2)
Total Protein: 7.7 g/dL (ref 6.0–8.3)

## 2021-03-27 LAB — HEMOGLOBIN A1C: Hgb A1c MFr Bld: 6.5 % (ref 4.6–6.5)

## 2021-03-27 MED ORDER — EMPAGLIFLOZIN 10 MG PO TABS
10.0000 mg | ORAL_TABLET | Freq: Every day | ORAL | 1 refills | Status: DC
Start: 2021-03-27 — End: 2021-10-07

## 2021-03-27 MED ORDER — ATORVASTATIN CALCIUM 20 MG PO TABS: 20.0000 mg | ORAL_TABLET | Freq: Every day | ORAL | 1 refills | Status: DC

## 2021-03-27 MED ORDER — METFORMIN HCL ER 500 MG PO TB24: 500.0000 mg | ORAL_TABLET | Freq: Two times a day (BID) | ORAL | 1 refills | Status: DC

## 2021-03-27 NOTE — Progress Notes (Signed)
Chris Watkins is a 50 y.o. male with the following history as recorded in EpicCare:  Patient Active Problem List   Diagnosis Date Noted  . Low back pain 11/29/2019  . Eczema 06/16/2018  . Erectile dysfunction 02/25/2018  . Type 2 diabetes mellitus ([place]) 04/23/2015  . Routine general medical examination at a health care facility 04/23/2015  . HTN (hypertension) 04/09/2014    Current Outpatient Medications  Medication Sig Dispense Refill  . amLODipine (NORVASC) 10 MG tablet TAKE 1 TABLET BY MOUTH EVERY DAY 90 tablet 3  . glucose blood (FREESTYLE LITE) test strip Use to check blood sugars twice a day. Dx E11.9 100 each 3  . hydrochlorothiazide (HYDRODIURIL) 25 MG tablet TAKE 1 TABLET BY MOUTH EVERY DAY 90 tablet 3  . JARDIANCE 10 MG TABS tablet TAKE 1 TABLET BY MOUTH EVERY DAY 90 tablet 1  . Lancets (FREESTYLE) lancets Use to check blood sugars twice a day Dx E11.9 100 each 3  . metFORMIN (GLUCOPHAGE-XR) 500 MG 24 hr tablet Take 2 tablets in the am; take 1 in the pm (Patient taking differently: 500 mg 2 (two) times daily.) 270 tablet 3  . quinapril (ACCUPRIL) 40 MG tablet TAKE 1 TABLET BY MOUTH EVERYDAY AT BEDTIME 90 tablet 3  . triamcinolone ointment (KENALOG) 0.5 % APPLY TO AFFECTED AREA TWICE A DAY 60 g 1  . atorvastatin (LIPITOR) 20 MG tablet Take 1 tablet (20 mg total) by mouth daily. 90 tablet 1   No current facility-administered medications for this visit.    Allergies: Patient has no known allergies.  Past Medical History:  Diagnosis Date  . Chicken pox   . Diabetes mellitus without complication ([place])   . Hyperlipidemia   . Hypertension   . Sleep apnea    wears CPAP    Past Surgical History:  Procedure Laterality Date  . HERNIA REPAIR     age[age]  . SLIPPED CAPITAL FEMORAL EPIPHYSIS PINNING Bilateral    in 6th grade    Family History  Problem Relation Age of Onset  . Thyroid disease Mother   . Hypertension Father   . Diabetes Father   . Diabetes Maternal  Grandmother   . Colon cancer Neg Hx   . Esophageal cancer Neg Hx   . Stomach cancer Neg Hx   . Rectal cancer Neg Hx     Social History   Tobacco Use  . Smoking status: Former Smoker    Packs/day: 0.50    Types: Cigarettes    Quit date: 02/27/2018    Years since quitting: 3.0  . Smokeless tobacco: Never Used  Substance Use Topics  . Alcohol use: Not Currently    Comment: Has not since he was diagnosed with DM in 01/2015    Subjective:   6 month follow up on Type 2 Diabetes; at last OV, Metformin was cut back to 1 in the am and 1 in the pm; Denies any chest pain, shortness of breath, blurred vision or headache; no concerns for episodes of low blood sugar; notes that fasting blood sugar this am was at 98; goal is to work on losing 20-25 more pounds;     Objective:  Vitals:   03/27/21 1044  BP: 124/76  Pulse: 88  Temp: 98 F (36.7 C)  TempSrc: Oral  SpO2: 97%  Weight: 250 lb 9.6 oz (113.7 kg)  Height: 5' 11"  (1.803 m)    General: Well developed, well nourished, in no acute distress  Skin : Warm and  dry.  Head: Normocephalic and atraumatic  Lungs: Respirations unlabored; clear to auscultation bilaterally without wheeze, rales, rhonchi  CVS exam: normal rate and regular rhythm.  Neurologic: Alert and oriented; speech intact; face symmetrical; moves all extremities well; CNII-XII intact without focal deficit   Assessment:  1. Type 2 diabetes mellitus without complication, without long-term current use of insulin ([place])   2. Mixed hyperlipidemia   3. Primary hypertension     Plan:  1. Update Hgba1c; based on fasting blood sugar readings, will most likely cut the Metformin back to once per day and continue Jardiance; follow-up to be determined; 2. Stable; refill updated; 3. Stable; continue same medications;  This visit occurred during the SARS-CoV-2 public health emergency.  Safety protocols were in place, including screening questions prior to the visit, additional usage  of staff PPE, and extensive cleaning of exam room while observing appropriate contact time as indicated for disinfecting solutions.      Return in about 6 months (around 09/26/2021) for Dr. Quay Burow Type 2 Diabetes.  Orders Placed This Encounter  Procedures  . Comp Met (CMET)    Standing Status:   Future    Number of Occurrences:   1    Standing Expiration Date:   03/27/2022  . Hemoglobin A1c    Standing Status:   Future    Number of Occurrences:   1    Standing Expiration Date:   03/27/2022    Requested Prescriptions   Signed Prescriptions Disp Refills  . atorvastatin (LIPITOR) 20 MG tablet 90 tablet 1    Sig: Take 1 tablet (20 mg total) by mouth daily.

## 2021-06-09 ENCOUNTER — Ambulatory Visit: Payer: BC Managed Care – PPO | Admitting: Family

## 2021-06-09 ENCOUNTER — Other Ambulatory Visit: Payer: Self-pay

## 2021-06-09 ENCOUNTER — Encounter: Payer: Self-pay | Admitting: Family

## 2021-06-09 VITALS — BP 138/70 | HR 86 | Temp 98.4°F | Ht 71.0 in | Wt 246.0 lb

## 2021-06-09 DIAGNOSIS — L309 Dermatitis, unspecified: Secondary | ICD-10-CM

## 2021-06-09 DIAGNOSIS — L237 Allergic contact dermatitis due to plants, except food: Secondary | ICD-10-CM

## 2021-06-09 DIAGNOSIS — L989 Disorder of the skin and subcutaneous tissue, unspecified: Secondary | ICD-10-CM | POA: Diagnosis not present

## 2021-06-09 MED ORDER — FEXOFENADINE HCL 180 MG PO TABS: 180.0000 mg | ORAL_TABLET | Freq: Every day | ORAL | 0 refills | Status: DC

## 2021-06-09 MED ORDER — FAMOTIDINE 20 MG PO TABS: 20.0000 mg | ORAL_TABLET | Freq: Two times a day (BID) | ORAL | 0 refills | Status: DC

## 2021-06-09 MED ORDER — PREDNISONE 20 MG PO TABS: 20.0000 mg | ORAL_TABLET | Freq: Every day | ORAL | 0 refills | Status: DC

## 2021-06-09 NOTE — Progress Notes (Signed)
Chris Watkins is a 50 y.o. male with the following history as recorded in EpicCare:  Patient Active Problem List   Diagnosis Date Noted   Low back pain 11/29/2019   Eczema 06/16/2018   Erectile dysfunction 02/25/2018   Type 2 diabetes mellitus ([place]) 04/23/2015   Routine general medical examination at a health care facility 04/23/2015   HTN (hypertension) 04/09/2014    Current Outpatient Medications  Medication Sig Dispense Refill   amLODipine (NORVASC) 10 MG tablet TAKE 1 TABLET BY MOUTH EVERY DAY 90 tablet 3   atorvastatin (LIPITOR) 20 MG tablet Take 1 tablet (20 mg total) by mouth daily. 90 tablet 1   empagliflozin (JARDIANCE) 10 MG TABS tablet Take 1 tablet (10 mg total) by mouth daily. 90 tablet 1   famotidine (PEPCID) 20 MG tablet Take 1 tablet (20 mg total) by mouth 2 (two) times daily. 20 tablet 0   fexofenadine (ALLEGRA ALLERGY) 180 MG tablet Take 1 tablet (180 mg total) by mouth daily. 20 tablet 0   glucose blood (FREESTYLE LITE) test strip Use to check blood sugars twice a day. Dx E11.9 100 each 3   hydrochlorothiazide (HYDRODIURIL) 25 MG tablet TAKE 1 TABLET BY MOUTH EVERY DAY 90 tablet 3   Lancets (FREESTYLE) lancets Use to check blood sugars twice a day Dx E11.9 100 each 3   metFORMIN (GLUCOPHAGE-XR) 500 MG 24 hr tablet Take 1 tablet (500 mg total) by mouth in the morning and at bedtime. 180 tablet 1   predniSONE (DELTASONE) 20 MG tablet Take 1 tablet (20 mg total) by mouth daily with breakfast. 5 tablet 0   quinapril (ACCUPRIL) 40 MG tablet TAKE 1 TABLET BY MOUTH EVERYDAY AT BEDTIME 90 tablet 3   triamcinolone ointment (KENALOG) 0.5 % APPLY TO AFFECTED AREA TWICE A DAY 60 g 1   No current facility-administered medications for this visit.    Allergies: Patient has no known allergies.  Past Medical History:  Diagnosis Date   Chicken pox    Diabetes mellitus without complication ([facility])    Hyperlipidemia    Hypertension    Sleep apnea    wears CPAP    Past Surgical  History:  Procedure Laterality Date   HERNIA REPAIR     age8-8   SLIPPED CAPITAL FEMORAL EPIPHYSIS PINNING Bilateral    in 6th grade    Family History  Problem Relation Age of Onset   Thyroid disease Mother    Hypertension Father    Diabetes Father    Diabetes Maternal Grandmother    Colon cancer Neg Hx    Esophageal cancer Neg Hx    Stomach cancer Neg Hx    Rectal cancer Neg Hx     Social History   Tobacco Use   Smoking status: Former    Packs/day: 0.50    Pack years: 0.00    Types: Cigarettes    Quit date: 02/27/2018    Years since quitting: 3.2   Smokeless tobacco: Never  Substance Use Topics   Alcohol use: Not Currently    Comment: Has not since he was diagnosed with DM in 01/2015    Subjective: Presents with concerns for "rash" on forearm- seemed to start after doing yardwork; also requesting referral to dermatology;      Objective:  Vitals:   06/09/21 0839  BP: 138/70  Pulse: 86  Temp: 98.4 F (36.9 C)  TempSrc: Oral  SpO2: 98%  Weight: 246 lb (111.6 kg)  Height: 5\' 11"  (1.803 m)  General: Well developed, well nourished, in no acute distress  Skin : Warm and dry.  Head: Normocephalic and atraumatic  Lungs: Respirations unlabored; clear to auscultation bilaterally without wheeze, rales, rhonchi  CVS exam: normal rate and regular rhythm.  Neurologic: Alert and oriented; speech intact; face symmetrical; moves all extremities well; CNII-XII intact without focal deficit   Assessment:  1. Allergic contact dermatitis due to plants, except food   2. Skin lesion   3. Eczema, unspecified type     Plan:  Rx for Prednisone 20 mg qd x 5 days, Allegra and Pepcid; follow up worse, no better. & 3. Refer to dermatology per patient request;  This visit occurred during the SARS-CoV-2 public health emergency.  Safety protocols were in place, including screening questions prior to the visit, additional usage of staff PPE, and extensive cleaning of exam room while  observing appropriate contact time as indicated for disinfecting solutions.    No follow-ups on file.  Orders Placed This Encounter  Procedures   Ambulatory referral to Dermatology    Referral Priority:   Routine    Referral Type:   Consultation    Referral Reason:   Specialty Services Required    Requested Specialty:   Dermatology    Number of Visits Requested:   1    Requested Prescriptions   Signed Prescriptions Disp Refills   predniSONE (DELTASONE) 20 MG tablet 5 tablet 0    Sig: Take 1 tablet (20 mg total) by mouth daily with breakfast.   fexofenadine (ALLEGRA ALLERGY) 180 MG tablet 20 tablet 0    Sig: Take 1 tablet (180 mg total) by mouth daily.   famotidine (PEPCID) 20 MG tablet 20 tablet 0    Sig: Take 1 tablet (20 mg total) by mouth 2 (two) times daily.

## 2021-09-04 ENCOUNTER — Ambulatory Visit: Payer: BC Managed Care – PPO | Admitting: Family

## 2021-09-04 ENCOUNTER — Other Ambulatory Visit: Payer: Self-pay

## 2021-09-04 ENCOUNTER — Encounter: Payer: Self-pay | Admitting: Family

## 2021-09-04 VITALS — BP 130/78 | HR 87 | Temp 97.8°F | Ht 71.0 in | Wt 243.4 lb

## 2021-09-04 DIAGNOSIS — E119 Type 2 diabetes mellitus without complications: Secondary | ICD-10-CM | POA: Diagnosis not present

## 2021-09-04 DIAGNOSIS — L309 Dermatitis, unspecified: Secondary | ICD-10-CM

## 2021-09-04 DIAGNOSIS — M79671 Pain in right foot: Secondary | ICD-10-CM | POA: Diagnosis not present

## 2021-09-04 MED ORDER — TRIAMCINOLONE ACETONIDE 0.5 % EX OINT: TOPICAL_OINTMENT | CUTANEOUS | 1 refills | Status: DC

## 2021-09-04 MED ORDER — METFORMIN HCL ER 500 MG PO TB24: 500.0000 mg | ORAL_TABLET | Freq: Two times a day (BID) | ORAL | 0 refills | Status: DC

## 2021-09-04 MED ORDER — MELOXICAM 15 MG PO TABS: 15.0000 mg | ORAL_TABLET | Freq: Every day | ORAL | 0 refills | Status: DC

## 2021-09-04 MED ORDER — ATORVASTATIN CALCIUM 20 MG PO TABS: 20.0000 mg | ORAL_TABLET | Freq: Every day | ORAL | 0 refills | Status: DC

## 2021-09-04 NOTE — Progress Notes (Signed)
Chris Watkins is a 50 y.o. male with the following history as recorded in EpicCare:  Patient Active Problem List   Diagnosis Date Noted   Low back pain 11/29/2019   Eczema 06/16/2018   Erectile dysfunction 02/25/2018   Type 2 diabetes mellitus (Chris Watkins) 04/23/2015   Routine general medical examination at a health care facility 04/23/2015   HTN (hypertension) 04/09/2014    Current Outpatient Medications  Medication Sig Dispense Refill   amLODipine (NORVASC) 10 MG tablet TAKE 1 TABLET BY MOUTH EVERY DAY 90 tablet 3   empagliflozin (JARDIANCE) 10 MG TABS tablet Take 1 tablet (10 mg total) by mouth daily. 90 tablet 1   glucose blood (FREESTYLE LITE) test strip Use to check blood sugars twice a day. Dx E11.9 100 each 3   hydrochlorothiazide (HYDRODIURIL) 25 MG tablet TAKE 1 TABLET BY MOUTH EVERY DAY 90 tablet 3   Lancets (FREESTYLE) lancets Use to check blood sugars twice a day Dx E11.9 100 each 3   meloxicam (MOBIC) 15 MG tablet Take 1 tablet (15 mg total) by mouth daily. 30 tablet 0   quinapril (ACCUPRIL) 40 MG tablet TAKE 1 TABLET BY MOUTH EVERYDAY AT BEDTIME 90 tablet 3   atorvastatin (LIPITOR) 20 MG tablet Take 1 tablet (20 mg total) by mouth daily. 90 tablet 0   metFORMIN (GLUCOPHAGE-XR) 500 MG 24 hr tablet Take 1 tablet (500 mg total) by mouth in the morning and at bedtime. 180 tablet 0   triamcinolone ointment (KENALOG) 0.5 % APPLY TO AFFECTED AREA TWICE A DAY 60 g 1   No current facility-administered medications for this visit.    Allergies: Patient has no known allergies.  Past Medical History:  Diagnosis Date   Chicken pox    Diabetes mellitus without complication (H[facility])    Hyperlipidemia    Hypertension    Sleep apnea    wears CPAP    Past Surgical History:  Procedure Laterality Date   HERNIA REPAIR     age[age]   SLIPPED CAPITAL FEMORAL EPIPHYSIS PINNING Bilateral    in 6th grade    Family History  Problem Relation Age of Onset   Thyroid disease Mother    Hypertension  Father    Diabetes Father    Diabetes Maternal Grandmother    Colon cancer Neg Hx    Esophageal cancer Neg Hx    Stomach cancer Neg Hx    Rectal cancer Neg Hx     Social History   Tobacco Use   Smoking status: Former    Packs/day: 0.50    Types: Cigarettes    Quit date: 02/27/2018    Years since quitting: 3.5   Smokeless tobacco: Never  Substance Use Topics   Alcohol use: Not Currently    Comment: Has not since he was diagnosed with DM in 01/2015    Subjective:  Right foot/ heel pain x 1 year; seems worse in the past few weeks- 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  Head: Normocephalic and atraumatic  Eyes: Sclera and conjunctiva clear; pupils round and reactive to light; extraocular movements intact  Ears: External normal; canals clear; tympanic membranes normal  Oropharynx: Pink, supple. No suspicious lesions  Neck: Supple without thyromegaly, adenopathy  Lungs: Respirations unlabored; Musculoskeletal: No deformities; no active joint inflammation  Extremities: No edema, cyanosis, clubbing  Vessels: Symmetric bilaterally  Neurologic: Alert and oriented; speech intact; face symmetrical; moves all extremities well; CNII-XII intact without focal deficit   Assessment:  1. Right foot pain   2. Eczema, unspecified type   3. Type 2 diabetes mellitus without complication, without long-term current use of insulin ([place])     Plan:  Referral to podiatry; Rx for Mobic 15 mg to use qd prn;  follow up to be determined;  Refill updated; Refill updated- will be due for labs at next OV;   This visit occurred during the SARS-CoV-2 public health emergency.  Safety protocols were in place, including screening questions prior to the visit, additional usage of staff PPE, and extensive cleaning of exam room while observing appropriate contact time as indicated for disinfecting solutions.    No follow-ups on file.  Orders Placed This Encounter  Procedures   Ambulatory referral to Podiatry    Referral Priority:   Routine    Referral Type:   Consultation    Referral Reason:   Specialty Services Required    Requested Specialty:   Podiatry    Number of Visits Requested:   1    Requested Prescriptions   Signed Prescriptions Disp Refills   meloxicam (MOBIC) 15 MG tablet 30 tablet 0    Sig: Take 1 tablet (15 mg total) by mouth daily.   triamcinolone ointment (KENALOG) 0.5 % 60 g 1    Sig: APPLY TO AFFECTED AREA TWICE A DAY   atorvastatin (LIPITOR) 20 MG tablet 90 tablet 0    Sig: Take 1 tablet (20 mg total) by mouth daily.   metFORMIN (GLUCOPHAGE-XR) 500 MG 24 hr tablet 180 tablet 0    Sig: Take 1 tablet (500 mg total) by mouth in the morning and at bedtime.

## 2021-09-17 ENCOUNTER — Encounter: Payer: Self-pay | Admitting: Podiatry

## 2021-09-17 ENCOUNTER — Ambulatory Visit: Payer: BC Managed Care – PPO | Admitting: Podiatry

## 2021-09-17 ENCOUNTER — Other Ambulatory Visit: Payer: Self-pay | Admitting: Podiatry

## 2021-09-17 ENCOUNTER — Ambulatory Visit (INDEPENDENT_AMBULATORY_CARE_PROVIDER_SITE_OTHER): Payer: BC Managed Care – PPO

## 2021-09-17 ENCOUNTER — Other Ambulatory Visit: Payer: Self-pay

## 2021-09-17 DIAGNOSIS — M722 Plantar fascial fibromatosis: Secondary | ICD-10-CM

## 2021-09-17 DIAGNOSIS — M778 Other enthesopathies, not elsewhere classified: Secondary | ICD-10-CM

## 2021-09-17 MED ORDER — MELOXICAM 15 MG PO TABS: 15.0000 mg | ORAL_TABLET | Freq: Every day | ORAL | 3 refills | Status: DC

## 2021-09-17 MED ORDER — TRIAMCINOLONE ACETONIDE 40 MG/ML IJ SUSP
20.0000 mg | Freq: Once | INTRAMUSCULAR | Status: AC
  Administered 2021-09-17: 20 mg

## 2021-09-17 NOTE — Patient Instructions (Signed)

## 2021-09-20 NOTE — Progress Notes (Signed)
Subjective:  Patient ID: Chris Watkins, male    DOB: Jun 06, 1971,  MRN: [id number] HPI Chief Complaint  Patient presents with   Foot Pain    Plantar heel right - aching x 1 year, worsened and more intense in the past 3 weeks, AM pain, PCP Rx'd meloxicam-helped a lot but has stopped taking it.   New Patient (Initial Visit)   Diabetes    Last a1c was 6.5    50 y.o. male presents with the above complaint.   ROS: Denies fever chills nausea vomiting muscle aches pains calf pain back pain chest pain shortness of breath.  Past Medical History:  Diagnosis Date   Chicken pox    Diabetes mellitus without complication ([place])    Hyperlipidemia    Hypertension    Sleep apnea    wears CPAP   Past Surgical History:  Procedure Laterality Date   HERNIA REPAIR     age[age]   SLIPPED CAPITAL FEMORAL EPIPHYSIS PINNING Bilateral    in 6th grade    Current Outpatient Medications:    meloxicam (MOBIC) 15 MG tablet, Take 1 tablet (15 mg total) by mouth daily., Disp: 30 tablet, Rfl: 3   amLODipine (NORVASC) 10 MG tablet, TAKE 1 TABLET BY MOUTH EVERY DAY, Disp: 90 tablet, Rfl: 3   atorvastatin (LIPITOR) 20 MG tablet, Take 1 tablet (20 mg total) by mouth daily., Disp: 90 tablet, Rfl: 0   empagliflozin (JARDIANCE) 10 MG TABS tablet, Take 1 tablet (10 mg total) by mouth daily., Disp: 90 tablet, Rfl: 1   glucose blood (FREESTYLE LITE) test strip, Use to check blood sugars twice a day. Dx E11.9, Disp: 100 each, Rfl: 3   hydrochlorothiazide (HYDRODIURIL) 25 MG tablet, TAKE 1 TABLET BY MOUTH EVERY DAY, Disp: 90 tablet, Rfl: 3   Lancets (FREESTYLE) lancets, Use to check blood sugars twice a day Dx E11.9, Disp: 100 each, Rfl: 3   metFORMIN (GLUCOPHAGE-XR) 500 MG 24 hr tablet, Take 1 tablet (500 mg total) by mouth in the morning and at bedtime., Disp: 180 tablet, Rfl: 0   quinapril (ACCUPRIL) 40 MG tablet, TAKE 1 TABLET BY MOUTH EVERYDAY AT BEDTIME, Disp: 90 tablet, Rfl: 3   triamcinolone ointment (KENALOG) 0.5  %, APPLY TO AFFECTED AREA TWICE A DAY, Disp: 60 g, Rfl: 1  No Known Allergies Review of Systems Objective:  There were no vitals filed for this visit.  General: Well developed, nourished, in no acute distress, alert and oriented x3   Dermatological: Skin is warm, dry and supple bilateral. Nails x 10 are well maintained; remaining integument appears unremarkable at this time. There are no open sores, no preulcerative lesions, no rash or signs of infection present.  Vascular: Dorsalis Pedis artery and Posterior Tibial artery pedal pulses are 2/4 bilateral with immedate capillary fill time. Pedal hair growth present. No varicosities and no lower extremity edema present bilateral.   Neruologic: Grossly intact via light touch bilateral. Vibratory intact via tuning fork bilateral. Protective threshold with Semmes Wienstein monofilament intact to all pedal sites bilateral. Patellar and Achilles deep tendon reflexes 2+ bilateral. No Babinski or clonus noted bilateral.   Musculoskeletal: No gross boney pedal deformities bilateral. No pain, crepitus, or limitation noted with foot and ankle range of motion bilateral. Muscular strength 5/5 in all groups tested bilateral.  Pain on palpation medial calcaneal tubercle of the right heel.  He has flexible pes planus of the right foot.  Gait: Unassisted, Nonantalgic.    Radiographs:  Radiographs taken today  demonstrate a soft tissue increase in density plantar fascial kidney insertion site no acute findings also demonstrates a chronic pes planovalgus right.  Assessment & Plan:   Assessment: Planter fasciitis and pes planovalgus  Plan: Discussed etiology pathology conservative versus surgical therapies.  At this point I injected the plantar fascial at with Kenalog and local anesthetic today 20 mg Kenalog 5 mg of Marcaine.  Also started him on meloxicam 15 mg #30 with 3 refills.  He will take 1 a day.  Also placed him in a plantar fascial brace and a single  night splint.  Discussed appropriate shoe gear stretching exercises ice therapy shoe gear modifications as well as the need for orthotics.  He will be placed on the casting schedule for orthotics to be casted.     Bhumi Godbey T. [place], [place]

## 2021-09-23 ENCOUNTER — Ambulatory Visit (INDEPENDENT_AMBULATORY_CARE_PROVIDER_SITE_OTHER): Payer: BC Managed Care – PPO

## 2021-09-23 ENCOUNTER — Other Ambulatory Visit: Payer: Self-pay

## 2021-09-23 DIAGNOSIS — M722 Plantar fascial fibromatosis: Secondary | ICD-10-CM

## 2021-09-23 NOTE — Progress Notes (Signed)
Patient came in to be molded for custom orthotics. Was inform he will be contacted when orthotics have arrived back in the office.  Patient understood this inform and signed Patient Financial Responsibility Statement Medical Devices form.   Shoe size: 11.5 men ( wide ) Dx: Plantar Fasciitis  Dr. Milinda Pointer' patient

## 2021-10-01 DIAGNOSIS — E785 Hyperlipidemia, unspecified: Secondary | ICD-10-CM | POA: Insufficient documentation

## 2021-10-01 DIAGNOSIS — E1169 Type 2 diabetes mellitus with other specified complication: Secondary | ICD-10-CM | POA: Insufficient documentation

## 2021-10-01 NOTE — Progress Notes (Signed)
Subjective:    Patient ID: Chris Watkins, male    DOB: [birth year], 50 y.o.   MRN: [id number]  This visit occurred during the SARS-CoV-2 public health emergency.  Safety protocols were in place, including screening questions prior to the visit, additional usage of staff PPE, and extensive cleaning of exam room while observing appropriate contact time as indicated for disinfecting solutions.     HPI He is here to establish with a new pcp.   The patient is here for follow up of their chronic medical problems, including htn, DM, hld.   He is active at work.   He has been losing weight.  He is taking all of his medications as prescribed.  He has no concerns.    Medications and allergies reviewed with patient and updated if appropriate.  Patient Active Problem List   Diagnosis Date Noted   Plantar fasciitis 10/02/2021   Hyperlipidemia 10/01/2021   Low back pain 11/29/2019   Eczema 06/16/2018   Erectile dysfunction 02/25/2018   Type 2 diabetes mellitus ([place]) 04/23/2015   HTN (hypertension) 04/09/2014    Current Outpatient Medications on File Prior to Visit  Medication Sig Dispense Refill   amLODipine (NORVASC) 10 MG tablet TAKE 1 TABLET BY MOUTH EVERY DAY 90 tablet 3   atorvastatin (LIPITOR) 20 MG tablet Take 1 tablet (20 mg total) by mouth daily. 90 tablet 0   empagliflozin (JARDIANCE) 10 MG TABS tablet Take 1 tablet (10 mg total) by mouth daily. 90 tablet 1   glucose blood (FREESTYLE LITE) test strip Use to check blood sugars twice a day. Dx E11.9 100 each 3   hydrochlorothiazide (HYDRODIURIL) 25 MG tablet TAKE 1 TABLET BY MOUTH EVERY DAY 90 tablet 3   meloxicam (MOBIC) 15 MG tablet Take 1 tablet (15 mg total) by mouth daily. 30 tablet 3   metFORMIN (GLUCOPHAGE-XR) 500 MG 24 hr tablet Take 1 tablet (500 mg total) by mouth in the morning and at bedtime. 180 tablet 0   quinapril (ACCUPRIL) 40 MG tablet TAKE 1 TABLET BY MOUTH EVERYDAY AT BEDTIME 90 tablet 3   triamcinolone  ointment (KENALOG) 0.5 % APPLY TO AFFECTED AREA TWICE A DAY 60 g 1   No current facility-administered medications on file prior to visit.    Past Medical History:  Diagnosis Date   Chicken pox    Diabetes mellitus without complication (HCC)    Hyperlipidemia    Hypertension    Sleep apnea    wears CPAP    Past Surgical History:  Procedure Laterality Date   HERNIA REPAIR     age7-9   SLIPPED CAPITAL FEMORAL EPIPHYSIS PINNING Bilateral    in 6th grade    Social History   Socioeconomic History   Marital status: Married    Spouse name: Not on file   Number of children: 3   Years of education: 14   Highest education level: Not on file  Occupational History   Occupation: Facilities manager  Tobacco Use   Smoking status: Former    Packs/day: 0.50    Types: Cigarettes    Quit date: 02/27/2018    Years since quitting: 3.5   Smokeless tobacco: Never  Vaping Use   Vaping Use: Never used  Substance and Sexual Activity   Alcohol use: Not Currently    Comment: Has not since he was diagnosed with DM in 01/2015   Drug use: No   Sexual activity: Not on file  Other Topics Concern  Not on file  Social History Narrative   Lives with his wife and children. Fun: Play golf, sing, listen to music, watch sports.     Denies religious beliefs effecting health care.    Social Determinants of Health   Financial Resource Strain: Not on file  Food Insecurity: Not on file  Transportation Needs: Not on file  Physical Activity: Not on file  Stress: Not on file  Social Connections: Not on file    Family History  Problem Relation Age of Onset   Thyroid disease Mother    Hypertension Father    Diabetes Father    Diabetes Maternal Grandmother    Colon cancer Neg Hx    Esophageal cancer Neg Hx    Stomach cancer Neg Hx    Rectal cancer Neg Hx     Review of Systems  Constitutional:  Negative for chills and fever.  Respiratory:  Negative for cough, shortness of breath and wheezing.    Cardiovascular:  Negative for chest pain, palpitations and leg swelling.  Neurological:  Negative for light-headedness and headaches.      Objective:   Vitals:   10/02/21 1405  BP: 132/76  Pulse: 89  Temp: 98.2 F (36.8 C)  SpO2: 96%   BP Readings from Last 3 Encounters:  10/02/21 132/76  09/04/21 130/78  06/09/21 138/70   Wt Readings from Last 3 Encounters:  10/02/21 239 lb (108.4 kg)  09/04/21 243 lb 6.4 oz (110.4 kg)  06/09/21 246 lb (111.6 kg)   Body mass index is 33.33 kg/m.   Physical Exam    Constitutional: Appears well-developed and well-nourished. No distress.  HENT:  Head: Normocephalic and atraumatic.  Neck: Neck supple. No tracheal deviation present. No thyromegaly present.  No cervical lymphadenopathy Cardiovascular: Normal rate, regular rhythm and normal heart sounds.   No murmur heard. No carotid bruit .  No edema Pulmonary/Chest: Effort normal and breath sounds normal. No respiratory distress. No has no wheezes. No rales.  Skin: Skin is warm and dry. Not diaphoretic.  Psychiatric: Normal mood and affect. Behavior is normal.      Assessment & Plan:    See Problem List for Assessment and Plan of chronic medical problems.

## 2021-10-01 NOTE — Patient Instructions (Addendum)
    Blood work was ordered.     Medications changes include :  none   Your prescription(s) have been submitted to your pharmacy. Please take as directed and contact our office if you believe you are having problem(s) with the medication(s).   Please followup in 6 months  

## 2021-10-02 ENCOUNTER — Ambulatory Visit: Payer: BC Managed Care – PPO | Admitting: Internal Medicine

## 2021-10-02 ENCOUNTER — Other Ambulatory Visit: Payer: Self-pay

## 2021-10-02 ENCOUNTER — Encounter: Payer: Self-pay | Admitting: Internal Medicine

## 2021-10-02 VITALS — BP 132/76 | HR 89 | Temp 98.2°F | Ht 71.0 in | Wt 239.0 lb

## 2021-10-02 DIAGNOSIS — E1165 Type 2 diabetes mellitus with hyperglycemia: Secondary | ICD-10-CM

## 2021-10-02 DIAGNOSIS — E782 Mixed hyperlipidemia: Secondary | ICD-10-CM | POA: Diagnosis not present

## 2021-10-02 DIAGNOSIS — Z23 Encounter for immunization: Secondary | ICD-10-CM

## 2021-10-02 DIAGNOSIS — M722 Plantar fascial fibromatosis: Secondary | ICD-10-CM | POA: Insufficient documentation

## 2021-10-02 DIAGNOSIS — I10 Essential (primary) hypertension: Secondary | ICD-10-CM | POA: Diagnosis not present

## 2021-10-02 LAB — COMPREHENSIVE METABOLIC PANEL
ALT: 16 U/L (ref 0–53)
AST: 19 U/L (ref 0–37)
Albumin: 4.3 g/dL (ref 3.5–5.2)
Alkaline Phosphatase: 93 U/L (ref 39–117)
BUN: 16 mg/dL (ref 6–23)
CO2: 30 mEq/L (ref 19–32)
Calcium: 9.3 mg/dL (ref 8.4–10.5)
Chloride: 101 mEq/L (ref 96–112)
Creatinine, Ser: 0.76 mg/dL (ref 0.40–1.50)
GFR: 104.37 mL/min (ref >60.00)
Glucose, Bld: 101 mg/dL — ABNORMAL HIGH (ref 70–99)
Potassium: 3.7 mEq/L (ref 3.5–5.1)
Sodium: 138 mEq/L (ref 135–145)
Total Bilirubin: 0.6 mg/dL (ref 0.2–1.2)
Total Protein: 7.9 g/dL (ref 6.0–8.3)

## 2021-10-02 LAB — LIPID PANEL
Cholesterol: 106 mg/dL (ref 0–200)
HDL: 32.5 mg/dL — ABNORMAL LOW (ref >39.00)
LDL Cholesterol: 50 mg/dL (ref 0–99)
NonHDL: 73.72
Total CHOL/HDL Ratio: 3
Triglycerides: 118 mg/dL (ref 0.0–149.0)
VLDL: 23.6 mg/dL (ref 0.0–40.0)

## 2021-10-02 LAB — HEMOGLOBIN A1C: Hgb A1c MFr Bld: 6.4 % (ref 4.6–6.5)

## 2021-10-02 MED ORDER — SELECT-LITE DEVICE/LANCETS KIT: PACK | 0 refills | Status: DC

## 2021-10-02 NOTE — Addendum Note (Signed)
Addended by: Jacobo Forest on: 10/02/2021 03:02 PM   Modules accepted: Orders

## 2021-10-02 NOTE — Assessment & Plan Note (Addendum)
Chronic BP well controlled Continue amlodipine 10 mg, quinapril 40 mg qd, hctz 25mg  qd cmp

## 2021-10-02 NOTE — Assessment & Plan Note (Signed)
Chronic Check lipid panel  Continue lipitor 20 mg daily Regular exercise and healthy diet encouraged  

## 2021-10-02 NOTE — Assessment & Plan Note (Signed)
Chronic Check a1c Well controlled Continue jardiance 10 mg daily, metformin xr 500 mg bid

## 2021-10-05 ENCOUNTER — Encounter: Payer: Self-pay | Admitting: Internal Medicine

## 2021-10-05 NOTE — Addendum Note (Signed)
Addended by: Marcina Millard on: 10/05/2021 03:27 PM   Modules accepted: Orders

## 2021-10-06 ENCOUNTER — Other Ambulatory Visit: Payer: Self-pay | Admitting: Internal Medicine

## 2021-10-07 ENCOUNTER — Other Ambulatory Visit: Payer: Self-pay | Admitting: Family

## 2021-10-07 DIAGNOSIS — E119 Type 2 diabetes mellitus without complications: Secondary | ICD-10-CM

## 2021-10-12 ENCOUNTER — Other Ambulatory Visit: Payer: Self-pay | Admitting: Family

## 2021-10-13 MED ORDER — METFORMIN HCL ER 500 MG PO TB24: 500.0000 mg | ORAL_TABLET | Freq: Two times a day (BID) | ORAL | 0 refills | Status: DC

## 2021-10-20 ENCOUNTER — Ambulatory Visit: Payer: BC Managed Care – PPO | Admitting: Podiatry

## 2021-10-21 ENCOUNTER — Other Ambulatory Visit: Payer: Self-pay | Admitting: Family

## 2021-10-21 DIAGNOSIS — E119 Type 2 diabetes mellitus without complications: Secondary | ICD-10-CM

## 2021-10-22 MED ORDER — ATORVASTATIN CALCIUM 20 MG PO TABS
20.0000 mg | ORAL_TABLET | Freq: Every day | ORAL | 0 refills | Status: DC
Start: 2021-10-22 — End: 2022-04-09

## 2021-11-05 ENCOUNTER — Other Ambulatory Visit: Payer: Self-pay

## 2021-11-05 ENCOUNTER — Ambulatory Visit (INDEPENDENT_AMBULATORY_CARE_PROVIDER_SITE_OTHER): Payer: BC Managed Care – PPO | Admitting: Podiatry

## 2021-11-05 ENCOUNTER — Encounter: Payer: Self-pay | Admitting: Podiatry

## 2021-11-05 DIAGNOSIS — M722 Plantar fascial fibromatosis: Secondary | ICD-10-CM

## 2021-11-05 NOTE — Progress Notes (Signed)
Patient presents today to pick up custom molded foot orthotics, diagnosed with plantar fasciitis by Dr. Milinda Pointer.   Orthotics were dispensed and fit was satisfactory. Reviewed instructions for break-in and wear. Written instructions given to patient.  Patient will follow up as needed.   [facility] - order # [id number]

## 2021-11-07 ENCOUNTER — Other Ambulatory Visit: Payer: Self-pay | Admitting: Family

## 2021-11-07 DIAGNOSIS — I10 Essential (primary) hypertension: Secondary | ICD-10-CM

## 2021-11-25 ENCOUNTER — Other Ambulatory Visit: Payer: Self-pay

## 2021-11-25 ENCOUNTER — Telehealth: Payer: Self-pay | Admitting: Internal Medicine

## 2021-11-25 DIAGNOSIS — I10 Essential (primary) hypertension: Secondary | ICD-10-CM

## 2021-11-25 MED ORDER — QUINAPRIL HCL 40 MG PO TABS: ORAL_TABLET | ORAL | 3 refills | Status: DC

## 2021-11-25 NOTE — Telephone Encounter (Signed)
Faxed today

## 2021-11-25 NOTE — Telephone Encounter (Signed)
1.Medication Requested: quinapril (ACCUPRIL) 40 MG tablet hydrochlorothiazide (HYDRODIURIL) 25 MG tablet   2. Pharmacy (Name, [place], [place]): [id number] [place], [place] - [address]  Phone:  [phone] Fax:  [phone]   3. On Med List: yes  4. Last Visit with PCP: 10.07.22  5. Next visit date with PCP: n/a   Agent: Please be advised that RX refills may take up to 3 business days. We ask that you follow-up with your pharmacy.

## 2021-11-26 ENCOUNTER — Encounter: Payer: Self-pay | Admitting: Internal Medicine

## 2021-11-26 DIAGNOSIS — I10 Essential (primary) hypertension: Secondary | ICD-10-CM

## 2021-11-27 MED ORDER — QUINAPRIL HCL 40 MG PO TABS: ORAL_TABLET | ORAL | 3 refills | Status: DC

## 2021-11-27 MED ORDER — HYDROCHLOROTHIAZIDE 25 MG PO TABS: 25.0000 mg | ORAL_TABLET | Freq: Every day | ORAL | 3 refills | Status: DC

## 2022-01-14 ENCOUNTER — Encounter: Payer: Self-pay | Admitting: Podiatry

## 2022-01-14 ENCOUNTER — Other Ambulatory Visit: Payer: Self-pay

## 2022-01-14 ENCOUNTER — Ambulatory Visit: Payer: BC Managed Care – PPO | Admitting: Podiatry

## 2022-01-14 DIAGNOSIS — M722 Plantar fascial fibromatosis: Secondary | ICD-10-CM

## 2022-01-14 NOTE — Progress Notes (Signed)
He presents today for follow-up of his right heel.  States that "orthotics my feet are doing great absolutely no problem whatsoever.  Objective: Vital signs are stable alert and oriented x3 no erythema edema cellulitis drainage or odor.  No reproducible pain.  Assessment: Healing plan fasciitis pes planus.  Plan: Continue use of the orthotics follow-up with him on an as-needed basis.

## 2022-01-19 ENCOUNTER — Telehealth: Payer: Self-pay | Admitting: Podiatry

## 2022-01-19 NOTE — Telephone Encounter (Signed)
Per Joann J @ [facility] [place] insurance the orthotic codes(L3020) is valid and billable and no auth is required. No limitations. Covered @ 80% after 1750.00 deductible (pt has met 0) out of pocket is 5000.00(met 155.00)  reference # [id number] °

## 2022-01-19 NOTE — Telephone Encounter (Signed)
Left message for pt to call to discuss orthotic benefits, covered @ 80% after 1750.00 deductible. Pt has met 0. °

## 2022-01-21 ENCOUNTER — Telehealth: Payer: Self-pay | Admitting: Podiatry

## 2022-01-21 NOTE — Telephone Encounter (Signed)
Pt returned my call yesterday afternoon and I returned his call today.   I explained that I cannot gaurantee payment from insurance but  Per Arville Go J @ [facility] [place] insurance the orthotic codes(L3020) is valid and billable and no Josem Kaufmann is required. No limitations. Covered @ 80% after 1750.00 deductible (pt has met 0).  As far as 2 pr there is no limitations but it is based on medical necessity. He said thank you for letting him know.

## 2022-01-28 ENCOUNTER — Other Ambulatory Visit: Payer: Self-pay | Admitting: Internal Medicine

## 2022-01-28 DIAGNOSIS — E119 Type 2 diabetes mellitus without complications: Secondary | ICD-10-CM

## 2022-02-02 DIAGNOSIS — E119 Type 2 diabetes mellitus without complications: Secondary | ICD-10-CM | POA: Diagnosis not present

## 2022-02-10 ENCOUNTER — Other Ambulatory Visit: Payer: Self-pay | Admitting: Family

## 2022-02-10 DIAGNOSIS — L309 Dermatitis, unspecified: Secondary | ICD-10-CM

## 2022-03-10 ENCOUNTER — Telehealth: Payer: Self-pay | Admitting: Internal Medicine

## 2022-03-10 MED ORDER — LISINOPRIL 40 MG PO TABS: 40.0000 mg | ORAL_TABLET | Freq: Every day | ORAL | 3 refills | Status: DC

## 2022-03-10 NOTE — Telephone Encounter (Signed)
Patient calling in ? ?[facility] (ACCUPRIL) 40 MG tablet due to a Pro[employer], television/film/video.. patient says he took his last dosage 03/10 & wants to know if provider can send in alternative since pharmacy is not sure when they will be able to restock ? ?[id number] [address] [place]:  [phone] ?Fax:  [phone] ?  ? ?

## 2022-03-10 NOTE — Telephone Encounter (Signed)
Alternative sent

## 2022-04-08 ENCOUNTER — Encounter: Payer: Self-pay | Admitting: Internal Medicine

## 2022-04-08 ENCOUNTER — Other Ambulatory Visit: Payer: Self-pay | Admitting: Internal Medicine

## 2022-04-08 NOTE — Patient Instructions (Addendum)
? ?Pneumonia vaccine today.  ? ? ?Blood work was ordered.   ? ? ?Medications changes include :   use the cream one a day. Fluconazole once a week for 3 weeks.  ? ? ?Your prescription(s) have been sent to your pharmacy.  ? ? ? ?Return in about 6 months (around 10/09/2022) for follow up. ? ? ?Health Maintenance, Male ?Adopting a healthy lifestyle and getting preventive care are important in promoting health and wellness. Ask your health care provider about: ?The right schedule for you to have regular tests and exams. ?Things you can do on your own to prevent diseases and keep yourself healthy. ?What should I know about diet, weight, and exercise? ?Eat a healthy diet ? ?Eat a diet that includes plenty of vegetables, fruits, low-fat dairy products, and lean protein. ?Do not eat a lot of foods that are high in solid fats, added sugars, or sodium. ?Maintain a healthy weight ?Body mass index (BMI) is a measurement that can be used to identify possible weight problems. It estimates body fat based on height and weight. Your health care provider can help determine your BMI and help you achieve or maintain a healthy weight. ?Get regular exercise ?Get regular exercise. This is one of the most important things you can do for your health. Most adults should: ?Exercise for at least 150 minutes each week. The exercise should increase your heart rate and make you sweat (moderate-intensity exercise). ?Do strengthening exercises at least twice a week. This is in addition to the moderate-intensity exercise. ?Spend less time sitting. Even light physical activity can be beneficial. ?Watch cholesterol and blood lipids ?Have your blood tested for lipids and cholesterol at 52 years of age, then have this test every 5 years. ?You may need to have your cholesterol levels checked more often if: ?Your lipid or cholesterol levels are high. ?You are older than 52 years of age. ?You are at high risk for heart disease. ?What should I know about  cancer screening? ?Many types of cancers can be detected early and may often be prevented. Depending on your health history and family history, you may need to have cancer screening at various ages. This may include screening for: ?Colorectal cancer. ?Prostate cancer. ?Skin cancer. ?Lung cancer. ?What should I know about heart disease, diabetes, and high blood pressure? ?Blood pressure and heart disease ?High blood pressure causes heart disease and increases the risk of stroke. This is more likely to develop in people who have high blood pressure readings or are overweight. ?Talk with your health care provider about your target blood pressure readings. ?Have your blood pressure checked: ?Every 3-5 years if you are 76-44 years of age. ?Every year if you are 51 years old or older. ?If you are between the ages of 30 and 48 and are a current or former smoker, ask your health care provider if you should have a one-time screening for abdominal aortic aneurysm (AAA). ?Diabetes ?Have regular diabetes screenings. This checks your fasting blood sugar level. Have the screening done: ?Once every three years after age 67 if you are at a normal weight and have a low risk for diabetes. ?More often and at a younger age if you are overweight or have a high risk for diabetes. ?What should I know about preventing infection? ?Hepatitis B ?If you have a higher risk for hepatitis B, you should be screened for this virus. Talk with your health care provider to find out if you are at risk for hepatitis B  infection. ?Hepatitis C ?Blood testing is recommended for: ?Everyone born from [birth year] through 1966. ?Anyone with known risk factors for hepatitis C. ?Sexually transmitted infections (STIs) ?You should be screened each year for STIs, including gonorrhea and chlamydia, if: ?You are sexually active and are younger than 52 years of age. ?You are older than 52 years of age and your health care provider tells you that you are at risk for this type  of infection. ?Your sexual activity has changed since you were last screened, and you are at increased risk for chlamydia or gonorrhea. Ask your health care provider if you are at risk. ?Ask your health care provider about whether you are at high risk for HIV. Your health care provider may recommend a prescription medicine to help prevent HIV infection. If you choose to take medicine to prevent HIV, you should first get tested for HIV. You should then be tested every 3 months for as long as you are taking the medicine. ?Follow these instructions at home: ?Alcohol use ?Do not drink alcohol if your health care provider tells you not to drink. ?If you drink alcohol: ?Limit how much you have to 0-2 drinks a day. ?Know how much alcohol is in your drink. In the [place] one drink equals one 12 oz bottle of beer (355 mL), one 5 oz glass of wine (148 mL), or one 1? oz glass of hard liquor (44 mL). ?Lifestyle ?Do not use any products that contain nicotine or tobacco. These products include cigarettes, chewing tobacco, and vaping devices, such as e-cigarettes. If you need help quitting, ask your health care provider. ?Do not use street drugs. ?Do not share needles. ?Ask your health care provider for help if you need support or information about quitting drugs. ?General instructions ?Schedule regular health, dental, and eye exams. ?Stay current with your vaccines. ?Tell your health care provider if: ?You often feel depressed. ?You have ever been abused or do not feel safe at home. ?Summary ?Adopting a healthy lifestyle and getting preventive care are important in promoting health and wellness. ?Follow your health care provider's instructions about healthy diet, exercising, and getting tested or screened for diseases. ?Follow your health care provider's instructions on monitoring your cholesterol and blood pressure. ?This information is not intended to replace advice given to you by your health care provider. Make sure you discuss  any questions you have with your health care provider. ?Document Revised: 05/04/2021 Document Reviewed: 05/04/2021 ?Elsevier Patient Education ? [place]. ? ?

## 2022-04-08 NOTE — Progress Notes (Signed)
? ? ?Subjective:  ? ? Patient ID: Chris Watkins, male    DOB: 07/17/1971, 51 y.o.   MRN: [id number] ? ? ?This visit occurred during the SARS-CoV-2 public health emergency.  Safety protocols were in place, including screening questions prior to the visit, additional usage of staff PPE, and extensive cleaning of exam room while observing appropriate contact time as indicated for disinfecting solutions. ? ? ?HPI ?Paton is here for  ?Chief Complaint  ?Patient presents with  ? Annual Exam  ? ? ? ?H/o eczema.  He has not had that in a while, but now he does have a rash in his private parts and perineal area.  He wondered if he needed to see dermatology.  He has been using cortisone twice daily and it helps the itch for a few hours, but overall its not helping and the rash seems to be spreading. ? ?No other concerns ? ? ? ?Medications and allergies reviewed with patient and updated if appropriate. ? ? ?Current Outpatient Medications on File Prior to Visit  ?Medication Sig Dispense Refill  ? amLODipine (NORVASC) 10 MG tablet TAKE 1 TABLET BY MOUTH EVERY DAY 90 tablet 3  ? atorvastatin (LIPITOR) 20 MG tablet Take 1 tablet (20 mg total) by mouth daily. 90 tablet 0  ? glucose blood (FREESTYLE LITE) test strip Use to check blood sugars twice a day. Dx E11.9 100 each 3  ? hydrochlorothiazide (HYDRODIURIL) 25 MG tablet Take 1 tablet (25 mg total) by mouth daily. 90 tablet 3  ? JARDIANCE 10 MG TABS tablet TAKE 1 TABLET BY MOUTH EVERY DAY 90 tablet 1  ? Lancets (FREESTYLE) lancets Use as directed to check sugars daily   E11.9 100 each 12  ? lisinopril (ZESTRIL) 40 MG tablet Take 1 tablet (40 mg total) by mouth daily. 90 tablet 3  ? meloxicam (MOBIC) 15 MG tablet Take 1 tablet (15 mg total) by mouth daily. 30 tablet 3  ? metFORMIN (GLUCOPHAGE-XR) 500 MG 24 hr tablet TAKE 1 TABLET (500 MG TOTAL) BY MOUTH IN THE MORNING AND AT BEDTIME. 180 tablet 0  ? triamcinolone ointment (KENALOG) 0.5 % APPLY TO AFFECTED AREA TWICE A DAY 60 g 1   ? ?No current facility-administered medications on file prior to visit.  ? ? ?Review of Systems  ?Constitutional:  Negative for chills and fever.  ?Eyes:  Negative for visual disturbance.  ?Respiratory:  Negative for cough, shortness of breath and wheezing.   ?Cardiovascular:  Negative for chest pain, palpitations and leg swelling.  ?Gastrointestinal:  Negative for abdominal pain, blood in stool, constipation, diarrhea and nausea.  ?     No gerd  ?Genitourinary:  Negative for difficulty urinating, dysuria and hematuria.  ?Musculoskeletal:  Negative for arthralgias and back pain.  ?Skin:  Positive for rash.  ?Neurological:  Negative for light-headedness and headaches.  ?Psychiatric/Behavioral:  Negative for dysphoric mood. The patient is not nervous/anxious.   ? ?   ?Objective:  ? ?Vitals:  ? 04/09/22 1543  ?BP: 118/70  ?Pulse: 82  ?Temp: 98.1 ?F (36.7 ?C)  ?SpO2: 96%  ? ?Filed Weights  ? 04/09/22 1543  ?Weight: 242 lb (109.8 kg)  ? ?Body mass index is 33.75 kg/m?. ? ?BP Readings from Last 3 Encounters:  ?04/09/22 118/70  ?10/02/21 132/76  ?09/04/21 130/78  ? ? ?Wt Readings from Last 3 Encounters:  ?04/09/22 242 lb (109.8 kg)  ?10/02/21 239 lb (108.4 kg)  ?09/04/21 243 lb 6.4 oz (110.4 kg)  ? ? ?  ?  Physical Exam ?Constitutional: He appears well-developed and well-nourished. No distress.  ?HENT:  ?Head: Normocephalic and atraumatic.  ?Right Ear: External ear normal.  ?Left Ear: External ear normal.  ?Mouth/Throat: Oropharynx is clear and moist.  ?Normal ear canals and TM b/l  ?Eyes: Conjunctivae and EOM are normal.  ?Neck: Neck supple. No tracheal deviation present. No thyromegaly present.  ?No carotid bruit  ?Cardiovascular: Normal rate, regular rhythm, normal heart sounds and intact distal pulses.   ?No murmur heard. ?Pulmonary/Chest: Effort normal and breath sounds normal. No respiratory distress. He has no wheezes. He has no rales.  ?Abdominal: Soft. He exhibits no distension. There is no tenderness.   ?Genitourinary: deferred  ?Musculoskeletal: He exhibits no edema.  ?Lymphadenopathy:   He has no cervical adenopathy.  ?Skin: Skin is warm and dry. He is not diaphoretic.  Deferred examining rash ?Psychiatric: He has a normal mood and affect. His behavior is normal.  ? ? ? ? ?   ?Assessment & Plan:  ? ?Physical exam: ?Screening blood work  ordered ?Exercise   no regular exercise -- walks a lot a work ?Weight  working on weight loss ?Substance abuse   none ? ? ?Reviewed recommended immunizations.  Prevnar 20 today.  Will get shingles vaccine at some point ? ? ?Health Maintenance  ?Topic Date Due  ? COVID-19 Vaccine (4 - Booster for [place] series) 12/06/2020  ? OPHTHALMOLOGY EXAM  07/15/2021  ? HEMOGLOBIN A1C  04/02/2022  ? Zoster Vaccines- Shingrix (1 of 2) 07/09/2022 (Originally 08/27/2020)  ? INFLUENZA VACCINE  07/27/2022  ? FOOT EXAM  09/04/2022  ? COLONOSCOPY (Pts 45-46yr Insurance coverage will need to be confirmed)  11/29/2023  ? TETANUS/TDAP  04/22/2025  ? HIV Screening  Completed  ? HPV VACCINES  Aged Out  ? Hepatitis C Screening  Discontinued  ? ? ? ?See Problem List for Assessment and Plan of chronic medical problems. ? ? ?

## 2022-04-09 ENCOUNTER — Ambulatory Visit (INDEPENDENT_AMBULATORY_CARE_PROVIDER_SITE_OTHER): Payer: BC Managed Care – PPO | Admitting: Internal Medicine

## 2022-04-09 ENCOUNTER — Other Ambulatory Visit: Payer: Self-pay

## 2022-04-09 VITALS — BP 118/70 | HR 82 | Temp 98.1°F | Ht 71.0 in | Wt 242.0 lb

## 2022-04-09 DIAGNOSIS — E1165 Type 2 diabetes mellitus with hyperglycemia: Secondary | ICD-10-CM | POA: Diagnosis not present

## 2022-04-09 DIAGNOSIS — I10 Essential (primary) hypertension: Secondary | ICD-10-CM | POA: Diagnosis not present

## 2022-04-09 DIAGNOSIS — E782 Mixed hyperlipidemia: Secondary | ICD-10-CM | POA: Diagnosis not present

## 2022-04-09 DIAGNOSIS — Z23 Encounter for immunization: Secondary | ICD-10-CM

## 2022-04-09 DIAGNOSIS — Z125 Encounter for screening for malignant neoplasm of prostate: Secondary | ICD-10-CM

## 2022-04-09 DIAGNOSIS — E119 Type 2 diabetes mellitus without complications: Secondary | ICD-10-CM

## 2022-04-09 DIAGNOSIS — Z0000 Encounter for general adult medical examination without abnormal findings: Secondary | ICD-10-CM

## 2022-04-09 LAB — CBC WITH DIFFERENTIAL/PLATELET
Basophils Absolute: 0 10*3/uL (ref 0.0–0.1)
Basophils Relative: 0.7 % (ref 0.0–3.0)
Eosinophils Absolute: 0.1 10*3/uL (ref 0.0–0.7)
Eosinophils Relative: 2.1 % (ref 0.0–5.0)
HCT: 43.1 % (ref 39.0–52.0)
Hemoglobin: 14.3 g/dL (ref 13.0–17.0)
Lymphocytes Relative: 25.4 % (ref 12.0–46.0)
Lymphs Abs: 1.3 10*3/uL (ref 0.7–4.0)
MCHC: 33.1 g/dL (ref 30.0–36.0)
MCV: 89.3 fl (ref 78.0–100.0)
Monocytes Absolute: 0.5 10*3/uL (ref 0.1–1.0)
Monocytes Relative: 8.6 % (ref 3.0–12.0)
Neutro Abs: 3.3 10*3/uL (ref 1.4–7.7)
Neutrophils Relative %: 63.2 % (ref 43.0–77.0)
Platelets: 195 10*3/uL (ref 150.0–400.0)
RBC: 4.83 Mil/uL (ref 4.22–5.81)
RDW: 14.3 % (ref 11.5–15.5)
WBC: 5.3 10*3/uL (ref 4.0–10.5)

## 2022-04-09 LAB — COMPREHENSIVE METABOLIC PANEL
ALT: 18 U/L (ref 0–53)
AST: 17 U/L (ref 0–37)
Albumin: 4.4 g/dL (ref 3.5–5.2)
Alkaline Phosphatase: 89 U/L (ref 39–117)
BUN: 18 mg/dL (ref 6–23)
CO2: 30 mEq/L (ref 19–32)
Calcium: 9.4 mg/dL (ref 8.4–10.5)
Chloride: 98 mEq/L (ref 96–112)
Creatinine, Ser: 0.7 mg/dL (ref 0.40–1.50)
GFR: 106.61 mL/min (ref >60.00)
Glucose, Bld: 92 mg/dL (ref 70–99)
Potassium: 4.2 mEq/L (ref 3.5–5.1)
Sodium: 135 mEq/L (ref 135–145)
Total Bilirubin: 0.6 mg/dL (ref 0.2–1.2)
Total Protein: 7.4 g/dL (ref 6.0–8.3)

## 2022-04-09 LAB — MICROALBUMIN / CREATININE URINE RATIO
Creatinine,U: 76.3 mg/dL
Microalb Creat Ratio: 0.9 mg/g (ref 0.0–30.0)
Microalb, Ur: <0.7 mg/dL (ref 0.0–1.9)

## 2022-04-09 LAB — TSH: TSH: 1.81 u[IU]/mL (ref 0.35–5.50)

## 2022-04-09 LAB — LIPID PANEL
Cholesterol: 118 mg/dL (ref 0–200)
HDL: 31 mg/dL — ABNORMAL LOW (ref >39.00)
LDL Cholesterol: 65 mg/dL (ref 0–99)
NonHDL: 86.51
Total CHOL/HDL Ratio: 4
Triglycerides: 106 mg/dL (ref 0.0–149.0)
VLDL: 21.2 mg/dL (ref 0.0–40.0)

## 2022-04-09 LAB — HEMOGLOBIN A1C: Hgb A1c MFr Bld: 6.7 % — ABNORMAL HIGH (ref 4.6–6.5)

## 2022-04-09 LAB — PSA: PSA: 0.62 ng/mL (ref 0.10–4.00)

## 2022-04-09 MED ORDER — CLOTRIMAZOLE-BETAMETHASONE 1-0.05 % EX CREA
1.0000 | TOPICAL_CREAM | Freq: Every day | CUTANEOUS | 2 refills | Status: DC
Start: 2022-04-09 — End: 2022-10-15

## 2022-04-09 MED ORDER — METFORMIN HCL ER 500 MG PO TB24: 500.0000 mg | ORAL_TABLET | Freq: Two times a day (BID) | ORAL | 2 refills | Status: DC

## 2022-04-09 MED ORDER — ATORVASTATIN CALCIUM 20 MG PO TABS: 20.0000 mg | ORAL_TABLET | Freq: Every day | ORAL | 2 refills | Status: DC

## 2022-04-09 MED ORDER — EMPAGLIFLOZIN 10 MG PO TABS: 10.0000 mg | ORAL_TABLET | Freq: Every day | ORAL | 2 refills | Status: DC

## 2022-04-09 MED ORDER — FLUCONAZOLE 150 MG PO TABS: 150.0000 mg | ORAL_TABLET | ORAL | 0 refills | Status: DC

## 2022-04-09 NOTE — Assessment & Plan Note (Addendum)
Chronic ?Regular exercise and healthy diet encouraged ?Check lipid panel, CMP, TSH ?Continue atorvastatin 20 mg daily ?

## 2022-04-09 NOTE — Assessment & Plan Note (Signed)
Chronic Blood pressure well controlled CMP Continue amlodipine 10 mg daily, HCTZ 25 mg daily, lisinopril 40 mg daily 

## 2022-04-09 NOTE — Assessment & Plan Note (Signed)
Chronic ?Lab Results  ?Component Value Date  ? HGBA1C 6.4 10/02/2021  ? ?Sugars well controlled ?Check A1c, urine microalbumin today ?Continue metformin XR 500 mg twice daily, Jardiance 10 mg daily ?Stressed regular exercise, diabetic diet ?Will adjust medication as needed ? ?

## 2022-05-09 ENCOUNTER — Other Ambulatory Visit: Payer: Self-pay | Admitting: Internal Medicine

## 2022-06-04 ENCOUNTER — Ambulatory Visit
Admission: RE | Admit: 2022-06-04 | Discharge: 2022-06-04 | Disposition: A | Discharge status: Home / Self Care | Payer: BC Managed Care – PPO | Source: Ambulatory Visit | Attending: Emergency Medicine | Admitting: Emergency Medicine

## 2022-06-04 VITALS — BP 122/74 | HR 89 | Temp 98.0°F | Resp 18

## 2022-06-04 DIAGNOSIS — L03114 Cellulitis of left upper limb: Secondary | ICD-10-CM

## 2022-06-04 DIAGNOSIS — L237 Allergic contact dermatitis due to plants, except food: Secondary | ICD-10-CM | POA: Diagnosis not present

## 2022-06-04 MED ORDER — HYDROXYZINE HCL 25 MG PO TABS: 25.0000 mg | ORAL_TABLET | Freq: Four times a day (QID) | ORAL | 0 refills | Status: DC | PRN

## 2022-06-04 MED ORDER — TRIAMCINOLONE ACETONIDE 0.1 % EX CREA: 1.0000 "application " | TOPICAL_CREAM | Freq: Two times a day (BID) | CUTANEOUS | 0 refills | Status: DC

## 2022-06-04 MED ORDER — DOXYCYCLINE HYCLATE 100 MG PO CAPS: 100.0000 mg | ORAL_CAPSULE | Freq: Two times a day (BID) | ORAL | 0 refills | Status: AC

## 2022-06-04 NOTE — ED Triage Notes (Signed)
Patient presents to Urgent Care with complaints of rash on R forearm that he believes is poison ivy/oak since earlier this week when he was pulling weeds ar his mothers house. Patient reports potato for inflammation and Cortizone 10 .

## 2022-06-04 NOTE — Discharge Instructions (Addendum)
Finish the doxycycline, even if you feel better.  Try Claritin, Allegra, or Zyrtec for the itching.  If this does not work, then use the Atarax.  Discontinue the over-the-counter cortisone cream.  Follow-up here or see your doctor in 3 days if not getting any better so that we can change your antibiotics.

## 2022-06-04 NOTE — ED Provider Notes (Signed)
HPI  SUBJECTIVE:  Chris Watkins is a 51 y.o. male who presents with an erythematous, pruritic rash starting 9 days ago after pulling some weeds 10 days ago.  He thinks that he came into contact with poison ivy/poison oak.  He reports having a "black spot" on his left forearm, which has come off, leaving an eschar.  Reports serous drainage.  Reports localized swelling starting 4 days ago that is increasing in size.  He denies pain, states that it feels "tight".  He did not see or feel an insect bite.  No fevers, crusting.  He also has several pruritic papules on his right forearm.  He tried using a potato, OTC cortisone, alcohol, and leftover prescription Allegra for the past 2 days without improvement in symptoms.  There are no aggravating or relieving factors.  He has a past medical history of diabetes, hypertension.  No history of MRSA, chronic kidney disease.   Past Medical History:  Diagnosis Date   Chicken pox    Diabetes mellitus without complication (HCC)    Hyperlipidemia    Hypertension    Sleep apnea    wears CPAP    Past Surgical History:  Procedure Laterality Date   HERNIA REPAIR     age8-8   SLIPPED CAPITAL FEMORAL EPIPHYSIS PINNING Bilateral    in 6th grade    Family History  Problem Relation Age of Onset   Thyroid disease Mother    Hypertension Father    Diabetes Father    Diabetes Maternal Grandmother    Colon cancer Neg Hx    Esophageal cancer Neg Hx    Stomach cancer Neg Hx    Rectal cancer Neg Hx     Social History   Tobacco Use   Smoking status: Former    Packs/day: 0.50    Types: Cigarettes    Quit date: 02/27/2018    Years since quitting: 4.2   Smokeless tobacco: Never  Vaping Use   Vaping Use: Never used  Substance Use Topics   Alcohol use: Not Currently    Comment: Has not since he was diagnosed with DM in 01/2015   Drug use: No    No current facility-administered medications for this encounter.  Current Outpatient Medications:     doxycycline (VIBRAMYCIN) 100 MG capsule, Take 1 capsule (100 mg total) by mouth 2 (two) times daily for 7 days., Disp: 14 capsule, Rfl: 0   hydrOXYzine (ATARAX) 25 MG tablet, Take 1 tablet (25 mg total) by mouth every 6 (six) hours as needed for itching., Disp: 20 tablet, Rfl: 0   triamcinolone cream (KENALOG) 0.1 %, Apply 1 application  topically 2 (two) times daily. Apply for 2 weeks. May use on face, Disp: 30 g, Rfl: 0   amLODipine (NORVASC) 10 MG tablet, TAKE 1 TABLET BY MOUTH EVERY DAY, Disp: 90 tablet, Rfl: 3   atorvastatin (LIPITOR) 20 MG tablet, Take 1 tablet (20 mg total) by mouth daily., Disp: 90 tablet, Rfl: 2   clotrimazole-betamethasone (LOTRISONE) cream, Apply 1 application. topically daily., Disp: 30 g, Rfl: 2   empagliflozin (JARDIANCE) 10 MG TABS tablet, Take 1 tablet (10 mg total) by mouth daily., Disp: 90 tablet, Rfl: 2   fluconazole (DIFLUCAN) 150 MG tablet, Take 1 tablet (150 mg total) by mouth once a week., Disp: 3 tablet, Rfl: 0   glucose blood (FREESTYLE LITE) test strip, Use to check blood sugars twice a day. Dx E11.9, Disp: 100 each, Rfl: 3   hydrochlorothiazide (HYDRODIURIL) 25 MG  tablet, Take 1 tablet (25 mg total) by mouth daily., Disp: 90 tablet, Rfl: 3   Lancets (FREESTYLE) lancets, Use as directed to check sugars daily   E11.9, Disp: 100 each, Rfl: 12   lisinopril (ZESTRIL) 40 MG tablet, Take 1 tablet (40 mg total) by mouth daily., Disp: 90 tablet, Rfl: 3   metFORMIN (GLUCOPHAGE-XR) 500 MG 24 hr tablet, Take 1 tablet (500 mg total) by mouth in the morning and at bedtime., Disp: 180 tablet, Rfl: 2  No Known Allergies   ROS  As noted in HPI.   Physical Exam  BP 122/74 (BP Location: Right Arm)   Pulse 89   Temp 98 F (36.7 C)   Resp 18   SpO2 95%   Constitutional: Well developed, well nourished, no acute distress Eyes:  EOMI, conjunctiva normal bilaterally HENT: Normocephalic, atraumatic,mucus membranes moist Respiratory: Normal inspiratory  effort Cardiovascular: Normal rate GI: nondistended skin:  Blanchable nontender erythema, swelling measuring 8 x 9 cm distal left forearm with central eschar.  No expressible purulent drainage.  Marked area of erythema with a marker   Scattered papules with excoriations right forearm    Musculoskeletal: No swelling of the wrist, pain with wrist range of motion Neurologic: Alert & oriented x 3, no focal neuro deficits Psychiatric: Speech and behavior appropriate   ED Course   Medications - No data to display  No orders of the defined types were placed in this encounter.   No results found for this or any previous visit (from the past 24 hour(s)). No results found.  ED Clinical Impression  1. Cellulitis of left forearm   2. Allergic contact dermatitis due to plants, except food      ED Assessment/Plan  Presentation suggestive of a contact dermatitis with poison ivy/poison oak, with secondary infection.  Will have him discontinue the OTC cortisone, send home with triamcinolone, Claritin or Zyrtec, or Allegra, and if that does not work, then Atarax for the itching.  Believe that we can get away with topical rather than systemic steroids because he is a diabetic and because this is so limited.  Will send home with doxycycline for 1 week.  Return here or see his doctor if not getting any better in 48 hours, we can switch him to Keflex.  ER return precautions given.  Discussed MDM, treatment plan, and plan for follow-up with patient. Discussed sn/sx that should prompt return to the ED. patient agrees with plan.   Meds ordered this encounter  Medications   doxycycline (VIBRAMYCIN) 100 MG capsule    Sig: Take 1 capsule (100 mg total) by mouth 2 (two) times daily for 7 days.    Dispense:  14 capsule    Refill:  0   triamcinolone cream (KENALOG) 0.1 %    Sig: Apply 1 application  topically 2 (two) times daily. Apply for 2 weeks. May use on face    Dispense:  30 g    Refill:  0    hydrOXYzine (ATARAX) 25 MG tablet    Sig: Take 1 tablet (25 mg total) by mouth every 6 (six) hours as needed for itching.    Dispense:  20 tablet    Refill:  0      *This clinic note was created using [employer]. Therefore, there may be occasional mistakes despite careful proofreading.  ?    Melynda Ripple, MD 06/04/22 1410

## 2022-09-19 ENCOUNTER — Other Ambulatory Visit: Payer: Self-pay | Admitting: Internal Medicine

## 2022-10-14 ENCOUNTER — Encounter: Payer: Self-pay | Admitting: Internal Medicine

## 2022-10-14 NOTE — Progress Notes (Signed)
Subjective:    Patient ID: Chris Watkins, male    DOB: 09-02-1971, 51 y.o.   MRN: [id number]     HPI Trey is here for follow up of his chronic medical problems, including htn, DM, hld  Eyes are itching.  Eyelids itching.  H/o eczema - gets little flares intermittently.  Still has itchy rash on upper thighs.  Cream worked, but needs a refill.  He is active - walks all day at work - not exercising regularly.     Medications and allergies reviewed with patient and updated if appropriate.  Current Outpatient Medications on File Prior to Visit  Medication Sig Dispense Refill   amLODipine (NORVASC) 10 MG tablet TAKE 1 TABLET BY MOUTH EVERY DAY 90 tablet 3   atorvastatin (LIPITOR) 20 MG tablet Take 1 tablet (20 mg total) by mouth daily. 90 tablet 2   empagliflozin (JARDIANCE) 10 MG TABS tablet Take 1 tablet (10 mg total) by mouth daily. 90 tablet 2   glucose blood (FREESTYLE LITE) test strip Use to check blood sugars twice a day. Dx E11.9 100 each 3   hydrochlorothiazide (HYDRODIURIL) 25 MG tablet Take 1 tablet (25 mg total) by mouth daily. 90 tablet 3   hydrOXYzine (ATARAX) 25 MG tablet Take 1 tablet (25 mg total) by mouth every 6 (six) hours as needed for itching. 20 tablet 0   Lancets (FREESTYLE) lancets Use as directed to check sugars daily   E11.9 100 each 12   lisinopril (ZESTRIL) 40 MG tablet Take 1 tablet (40 mg total) by mouth daily. 90 tablet 3   metFORMIN (GLUCOPHAGE-XR) 500 MG 24 hr tablet Take 1 tablet (500 mg total) by mouth in the morning and at bedtime. 180 tablet 2   triamcinolone cream (KENALOG) 0.1 % Apply 1 application  topically 2 (two) times daily. Apply for 2 weeks. May use on face 30 g 0   No current facility-administered medications on file prior to visit.     Review of Systems  Constitutional:  Negative for fever.  Respiratory:  Negative for cough, shortness of breath and wheezing.   Cardiovascular:  Negative for chest pain, palpitations and leg  swelling.  Neurological:  Negative for light-headedness and headaches.       Objective:   Vitals:   10/15/22 1517  BP: 130/62  Pulse: 88  Temp: 98.3 F (36.8 C)  SpO2: 90%   BP Readings from Last 3 Encounters:  10/15/22 130/62  06/04/22 122/74  04/09/22 118/70   Wt Readings from Last 3 Encounters:  10/15/22 244 lb (110.7 kg)  04/09/22 242 lb (109.8 kg)  10/02/21 239 lb (108.4 kg)   Body mass index is 34.03 kg/m.    Physical Exam Constitutional:      General: He is not in acute distress.    Appearance: Normal appearance. He is not ill-appearing.  HENT:     Head: Normocephalic and atraumatic.  Eyes:     Conjunctiva/sclera: Conjunctivae normal.  Cardiovascular:     Rate and Rhythm: Normal rate and regular rhythm.     Heart sounds: Normal heart sounds. No murmur heard. Pulmonary:     Effort: Pulmonary effort is normal. No respiratory distress.     Breath sounds: Normal breath sounds. No wheezing or rales.  Musculoskeletal:     Right lower leg: No edema.     Left lower leg: No edema.  Skin:    General: Skin is warm and dry.     Findings:  Rash (Areas of dryness eyelids and arms-eczema) present.  Neurological:     Mental Status: He is alert. Mental status is at baseline.  Psychiatric:        Mood and Affect: Mood normal.        Lab Results  Component Value Date   WBC 5.3 04/09/2022   HGB 14.3 04/09/2022   HCT 43.1 04/09/2022   PLT 195.0 04/09/2022   GLUCOSE 92 04/09/2022   CHOL 118 04/09/2022   TRIG 106.0 04/09/2022   HDL 31.00 (L) 04/09/2022   LDLCALC 65 04/09/2022   ALT 18 04/09/2022   AST 17 04/09/2022   NA 135 04/09/2022   K 4.2 04/09/2022   CL 98 04/09/2022   CREATININE 0.70 04/09/2022   BUN 18 04/09/2022   CO2 30 04/09/2022   TSH 1.81 04/09/2022   PSA 0.62 04/09/2022   HGBA1C 6.7 (H) 04/09/2022   MICROALBUR <0.7 04/09/2022     Assessment & Plan:    See Problem List for Assessment and Plan of chronic medical problems.

## 2022-10-14 NOTE — Patient Instructions (Addendum)
      Blood work was ordered.   The lab is on the first floor.    Medications changes include :   none     Return in about 6 months (around 04/16/2023) for Physical Exam.

## 2022-10-15 ENCOUNTER — Encounter: Payer: Self-pay | Admitting: Internal Medicine

## 2022-10-15 ENCOUNTER — Ambulatory Visit: Payer: BC Managed Care – PPO | Admitting: Internal Medicine

## 2022-10-15 VITALS — BP 130/62 | HR 88 | Temp 98.3°F | Ht 71.0 in | Wt 244.0 lb

## 2022-10-15 DIAGNOSIS — I10 Essential (primary) hypertension: Secondary | ICD-10-CM | POA: Diagnosis not present

## 2022-10-15 DIAGNOSIS — E1165 Type 2 diabetes mellitus with hyperglycemia: Secondary | ICD-10-CM | POA: Diagnosis not present

## 2022-10-15 DIAGNOSIS — Z125 Encounter for screening for malignant neoplasm of prostate: Secondary | ICD-10-CM | POA: Diagnosis not present

## 2022-10-15 DIAGNOSIS — E782 Mixed hyperlipidemia: Secondary | ICD-10-CM | POA: Diagnosis not present

## 2022-10-15 DIAGNOSIS — L309 Dermatitis, unspecified: Secondary | ICD-10-CM | POA: Diagnosis not present

## 2022-10-15 LAB — COMPREHENSIVE METABOLIC PANEL
ALT: 21 U/L (ref 0–53)
AST: 18 U/L (ref 0–37)
Albumin: 4.4 g/dL (ref 3.5–5.2)
Alkaline Phosphatase: 82 U/L (ref 39–117)
BUN: 19 mg/dL (ref 6–23)
CO2: 24 mEq/L (ref 19–32)
Calcium: 9.3 mg/dL (ref 8.4–10.5)
Chloride: 101 mEq/L (ref 96–112)
Creatinine, Ser: 0.74 mg/dL (ref 0.40–1.50)
GFR: 104.45 mL/min (ref >60.00)
Glucose, Bld: 88 mg/dL (ref 70–99)
Potassium: 3.6 mEq/L (ref 3.5–5.1)
Sodium: 137 mEq/L (ref 135–145)
Total Bilirubin: 0.4 mg/dL (ref 0.2–1.2)
Total Protein: 7.6 g/dL (ref 6.0–8.3)

## 2022-10-15 LAB — PSA: PSA: 0.52 ng/mL (ref 0.10–4.00)

## 2022-10-15 LAB — LIPID PANEL
Cholesterol: 94 mg/dL (ref 0–200)
HDL: 29.6 mg/dL — ABNORMAL LOW (ref >39.00)
LDL Cholesterol: 46 mg/dL (ref 0–99)
NonHDL: 64.45
Total CHOL/HDL Ratio: 3
Triglycerides: 93 mg/dL (ref 0.0–149.0)
VLDL: 18.6 mg/dL (ref 0.0–40.0)

## 2022-10-15 LAB — HEMOGLOBIN A1C: Hgb A1c MFr Bld: 6.6 % — ABNORMAL HIGH (ref 4.6–6.5)

## 2022-10-15 MED ORDER — CLOTRIMAZOLE-BETAMETHASONE 1-0.05 % EX CREA: 1.0000 | TOPICAL_CREAM | Freq: Every day | CUTANEOUS | 5 refills | Status: DC

## 2022-10-15 MED ORDER — ONETOUCH DELICA LANCING DEV MISC: 2 refills | Status: AC

## 2022-10-15 MED ORDER — TACROLIMUS 0.1 % EX OINT: TOPICAL_OINTMENT | Freq: Two times a day (BID) | CUTANEOUS | 0 refills | Status: DC

## 2022-10-15 NOTE — Assessment & Plan Note (Signed)
Chronic  Lab Results  Component Value Date   HGBA1C 6.7 (H) 04/09/2022   Sugars controlled Testing sugars 1 times a day Check A1c Continue Jardiance 10 mg daily, metformin 500 mg twice daily Stressed regular exercise, diabetic diet

## 2022-10-15 NOTE — Assessment & Plan Note (Signed)
Chronic Intermittent flares Has eczema on his arms and he has hydrocortisone for that Eyelid dermatitis-possible eczema-hydrocortisone has not helped that much-we will prescribe Protopic ointment

## 2022-10-15 NOTE — Assessment & Plan Note (Signed)
Chronic Blood pressure well controlled CMP Continue amlodipine 10 mg daily, HCTZ 25 mg daily, lisinopril 40 mg daily 

## 2022-10-15 NOTE — Assessment & Plan Note (Signed)
Chronic °Regular exercise and healthy diet encouraged °Check lipid panel  °Continue atorvastatin 20 mg daily °

## 2022-12-16 ENCOUNTER — Other Ambulatory Visit: Payer: Self-pay | Admitting: Internal Medicine

## 2022-12-16 DIAGNOSIS — I10 Essential (primary) hypertension: Secondary | ICD-10-CM

## 2022-12-17 ENCOUNTER — Other Ambulatory Visit: Payer: Self-pay | Admitting: Internal Medicine

## 2022-12-17 DIAGNOSIS — I10 Essential (primary) hypertension: Secondary | ICD-10-CM

## 2023-01-04 ENCOUNTER — Other Ambulatory Visit: Payer: Self-pay | Admitting: Internal Medicine

## 2023-01-04 DIAGNOSIS — E119 Type 2 diabetes mellitus without complications: Secondary | ICD-10-CM

## 2023-01-09 ENCOUNTER — Other Ambulatory Visit: Payer: Self-pay | Admitting: Internal Medicine

## 2023-02-21 DIAGNOSIS — E119 Type 2 diabetes mellitus without complications: Secondary | ICD-10-CM | POA: Diagnosis not present

## 2023-02-21 LAB — HM DIABETES EYE EXAM

## 2023-04-07 ENCOUNTER — Other Ambulatory Visit: Payer: Self-pay

## 2023-04-07 ENCOUNTER — Other Ambulatory Visit: Payer: Self-pay | Admitting: Internal Medicine

## 2023-04-14 ENCOUNTER — Encounter: Payer: Self-pay | Admitting: Internal Medicine

## 2023-04-14 NOTE — Patient Instructions (Addendum)
Blood work was ordered.   The lab is on the first floor.    Medications changes include :   Rybelsus 3 mg daily     Return in about 6 months (around 10/15/2023) for follow up, shingles #2.   Health Maintenance, Male Adopting a healthy lifestyle and getting preventive care are important in promoting health and wellness. Ask your health care provider about: The right schedule for you to have regular tests and exams. Things you can do on your own to prevent diseases and keep yourself healthy. What should I know about diet, weight, and exercise? Eat a healthy diet  Eat a diet that includes plenty of vegetables, fruits, low-fat dairy products, and lean protein. Do not eat a lot of foods that are high in solid fats, added sugars, or sodium. Maintain a healthy weight Body mass index (BMI) is a measurement that can be used to identify possible weight problems. It estimates body fat based on height and weight. Your health care provider can help determine your BMI and help you achieve or maintain a healthy weight. Get regular exercise Get regular exercise. This is one of the most important things you can do for your health. Most adults should: Exercise for at least 150 minutes each week. The exercise should increase your heart rate and make you sweat (moderate-intensity exercise). Do strengthening exercises at least twice a week. This is in addition to the moderate-intensity exercise. Spend less time sitting. Even light physical activity can be beneficial. Watch cholesterol and blood lipids Have your blood tested for lipids and cholesterol at 53 years of age, then have this test every 5 years. You may need to have your cholesterol levels checked more often if: Your lipid or cholesterol levels are high. You are older than 53 years of age. You are at high risk for heart disease. What should I know about cancer screening? Many types of cancers can be detected early and may often be  prevented. Depending on your health history and family history, you may need to have cancer screening at various ages. This may include screening for: Colorectal cancer. Prostate cancer. Skin cancer. Lung cancer. What should I know about heart disease, diabetes, and high blood pressure? Blood pressure and heart disease High blood pressure causes heart disease and increases the risk of stroke. This is more likely to develop in people who have high blood pressure readings or are overweight. Talk with your health care provider about your target blood pressure readings. Have your blood pressure checked: Every 3-5 years if you are 17-63 years of age. Every year if you are 65 years old or older. If you are between the ages of 40 and 37 and are a current or former smoker, ask your health care provider if you should have a one-time screening for abdominal aortic aneurysm (AAA). Diabetes Have regular diabetes screenings. This checks your fasting blood sugar level. Have the screening done: Once every three years after age 67 if you are at a normal weight and have a low risk for diabetes. More often and at a younger age if you are overweight or have a high risk for diabetes. What should I know about preventing infection? Hepatitis B If you have a higher risk for hepatitis B, you should be screened for this virus. Talk with your health care provider to find out if you are at risk for hepatitis B infection. Hepatitis C Blood testing is recommended for: Everyone born from [birth year] through  36. Anyone with known risk factors for hepatitis C. Sexually transmitted infections (STIs) You should be screened each year for STIs, including gonorrhea and chlamydia, if: You are sexually active and are younger than 53 years of age. You are older than 53 years of age and your health care provider tells you that you are at risk for this type of infection. Your sexual activity has changed since you were last screened,  and you are at increased risk for chlamydia or gonorrhea. Ask your health care provider if you are at risk. Ask your health care provider about whether you are at high risk for HIV. Your health care provider may recommend a prescription medicine to help prevent HIV infection. If you choose to take medicine to prevent HIV, you should first get tested for HIV. You should then be tested every 3 months for as long as you are taking the medicine. Follow these instructions at home: Alcohol use Do not drink alcohol if your health care provider tells you not to drink. If you drink alcohol: Limit how much you have to 0-2 drinks a day. Know how much alcohol is in your drink. In the [place] one drink equals one 12 oz bottle of beer (355 mL), one 5 oz glass of wine (148 mL), or one 1 oz glass of hard liquor (44 mL). Lifestyle Do not use any products that contain nicotine or tobacco. These products include cigarettes, chewing tobacco, and vaping devices, such as e-cigarettes. If you need help quitting, ask your health care provider. Do not use street drugs. Do not share needles. Ask your health care provider for help if you need support or information about quitting drugs. General instructions Schedule regular health, dental, and eye exams. Stay current with your vaccines. Tell your health care provider if: You often feel depressed. You have ever been abused or do not feel safe at home. Summary Adopting a healthy lifestyle and getting preventive care are important in promoting health and wellness. Follow your health care provider's instructions about healthy diet, exercising, and getting tested or screened for diseases. Follow your health care provider's instructions on monitoring your cholesterol and blood pressure. This information is not intended to replace advice given to you by your health care provider. Make sure you discuss any questions you have with your health care provider. Document Revised:  05/04/2021 Document Reviewed: 05/04/2021 Elsevier Patient Education  2023 [employer].

## 2023-04-14 NOTE — Progress Notes (Signed)
Subjective:    Patient ID: Chris Watkins, male    DOB: 03/19/1971, 52 y.o.   MRN: [id number]     HPI Chris Watkins is here for a physical exam and his chronic medical problems.   No changes.  No concerns.    Medications and allergies reviewed with patient and updated if appropriate.  Current Outpatient Medications on File Prior to Visit  Medication Sig Dispense Refill   amLODipine (NORVASC) 10 MG tablet TAKE 1 TABLET BY MOUTH EVERY DAY 90 tablet 3   atorvastatin (LIPITOR) 20 MG tablet TAKE 1 TABLET BY MOUTH EVERY DAY 90 tablet 2   clotrimazole-betamethasone (LOTRISONE) cream Apply 1 Application topically daily. 45 g 5   empagliflozin (JARDIANCE) 10 MG TABS tablet Take 1 tablet (10 mg total) by mouth daily. 90 tablet 2   glucose blood (FREESTYLE LITE) test strip Use to check blood sugars twice a day. Dx E11.9 100 each 3   hydrOXYzine (ATARAX) 25 MG tablet Take 1 tablet (25 mg total) by mouth every 6 (six) hours as needed for itching. 20 tablet 0   Lancet Devices (ONE TOUCH DELICA LANCING DEV) MISC UAD to check sugars.  E11.9 1 each 2   Lancets (FREESTYLE) lancets Use as directed to check sugars daily   E11.9 100 each 12   lisinopril (ZESTRIL) 40 MG tablet TAKE 1 TABLET BY MOUTH EVERY DAY 90 tablet 3   metFORMIN (GLUCOPHAGE-XR) 500 MG 24 hr tablet TAKE 1 TABLET (500 MG TOTAL) BY MOUTH IN THE MORNING AND AT BEDTIME 180 tablet 2   tacrolimus (PROTOPIC) 0.1 % ointment Apply topically 2 (two) times daily. 30 g 0   triamcinolone cream (KENALOG) 0.1 % Apply 1 application  topically 2 (two) times daily. Apply for 2 weeks. May use on face 30 g 0   No current facility-administered medications on file prior to visit.    Review of Systems  Constitutional:  Negative for fever.  Eyes:  Negative for visual disturbance.  Respiratory:  Negative for cough, shortness of breath and wheezing.   Cardiovascular:  Negative for chest pain, palpitations and leg swelling.  Gastrointestinal:  Negative for  abdominal pain, blood in stool, constipation and diarrhea.       No gerd  Genitourinary:  Negative for difficulty urinating and dysuria.  Musculoskeletal:  Negative for arthralgias and back pain.  Skin:  Negative for rash.  Neurological:  Negative for light-headedness and headaches.  Psychiatric/Behavioral:  Negative for dysphoric mood. The patient is not nervous/anxious.        Objective:   Vitals:   04/15/23 1529  BP: 130/62  Pulse: 90  Temp: 98.6 F (37 C)  SpO2: 95%   Filed Weights   04/15/23 1529  Weight: 252 lb (114.3 kg)   Body mass index is 35.15 kg/m.  BP Readings from Last 3 Encounters:  04/15/23 130/62  10/15/22 130/62  06/04/22 122/74    Wt Readings from Last 3 Encounters:  04/15/23 252 lb (114.3 kg)  10/15/22 244 lb (110.7 kg)  04/09/22 242 lb (109.8 kg)      Physical Exam Constitutional: He appears well-developed and well-nourished. No distress.  HENT:  Head: Normocephalic and atraumatic.  Right Ear: External ear normal.  Left Ear: External ear normal.  Normal ear canals with excessive cerumen, TM's not seen due to wax Mouth/Throat: Oropharynx is clear and moist. Eyes: Conjunctivae and EOM are normal.  Neck: Neck supple. No tracheal deviation present. No thyromegaly present.  No carotid bruit  Cardiovascular: Normal rate, regular rhythm, normal heart sounds and intact distal pulses.   No murmur heard.  No lower extremity edema. Pulmonary/Chest: Effort normal and breath sounds normal. No respiratory distress. He has no wheezes. He has no rales.  Abdominal: Soft. He exhibits no distension. There is no tenderness.  Genitourinary: deferred  Lymphadenopathy:   He has no cervical adenopathy.  Skin: Skin is warm and dry. He is not diaphoretic.  Psychiatric: He has a normal mood and affect. His behavior is normal.         Assessment & Plan:   Physical exam: Screening blood work  ordered Exercise   walks all day for work - 14,000 steps, yard  work [employer]  obese Substance abuse   none   Reviewed recommended immunizations.  Shingrix vaccine #1 today   Health Maintenance  Topic Date Due   Zoster Vaccines- Shingrix (1 of 2) Never done   COVID-19 Vaccine (4 - 2023-24 season) 08/27/2022   FOOT EXAM  09/04/2022   Diabetic kidney evaluation - Urine ACR  04/10/2023   HEMOGLOBIN A1C  04/16/2023   INFLUENZA VACCINE  07/28/2023   Diabetic kidney evaluation - eGFR measurement  10/16/2023   COLONOSCOPY (Pts 45-85yrs Insurance coverage will need to be confirmed)  11/29/2023   OPHTHALMOLOGY EXAM  02/22/2024   DTaP/Tdap/Td (2 - Tdap) 04/22/2025   HIV Screening  Completed   HPV VACCINES  Aged Out   Hepatitis C Screening  Discontinued     See Problem List for Assessment and Plan of chronic medical problems.

## 2023-04-15 ENCOUNTER — Other Ambulatory Visit: Payer: Self-pay | Admitting: Internal Medicine

## 2023-04-15 ENCOUNTER — Ambulatory Visit (INDEPENDENT_AMBULATORY_CARE_PROVIDER_SITE_OTHER): Payer: BC Managed Care – PPO | Admitting: Internal Medicine

## 2023-04-15 VITALS — BP 130/62 | HR 90 | Temp 98.6°F | Ht 71.0 in | Wt 252.0 lb

## 2023-04-15 DIAGNOSIS — Z125 Encounter for screening for malignant neoplasm of prostate: Secondary | ICD-10-CM | POA: Diagnosis not present

## 2023-04-15 DIAGNOSIS — Z23 Encounter for immunization: Secondary | ICD-10-CM

## 2023-04-15 DIAGNOSIS — Z0000 Encounter for general adult medical examination without abnormal findings: Secondary | ICD-10-CM

## 2023-04-15 DIAGNOSIS — E782 Mixed hyperlipidemia: Secondary | ICD-10-CM

## 2023-04-15 DIAGNOSIS — I10 Essential (primary) hypertension: Secondary | ICD-10-CM

## 2023-04-15 DIAGNOSIS — E119 Type 2 diabetes mellitus without complications: Secondary | ICD-10-CM

## 2023-04-15 DIAGNOSIS — E1165 Type 2 diabetes mellitus with hyperglycemia: Secondary | ICD-10-CM | POA: Diagnosis not present

## 2023-04-15 DIAGNOSIS — E66811 Obesity, class 1: Secondary | ICD-10-CM | POA: Insufficient documentation

## 2023-04-15 LAB — COMPREHENSIVE METABOLIC PANEL
ALT: 18 U/L (ref 0–53)
AST: 20 U/L (ref 0–37)
Albumin: 4.4 g/dL (ref 3.5–5.2)
Alkaline Phosphatase: 97 U/L (ref 39–117)
BUN: 15 mg/dL (ref 6–23)
CO2: 28 mEq/L (ref 19–32)
Calcium: 9.2 mg/dL (ref 8.4–10.5)
Chloride: 99 mEq/L (ref 96–112)
Creatinine, Ser: 0.66 mg/dL (ref 0.40–1.50)
GFR: 107.75 mL/min (ref >60.00)
Glucose, Bld: 103 mg/dL — ABNORMAL HIGH (ref 70–99)
Potassium: 4 mEq/L (ref 3.5–5.1)
Sodium: 136 mEq/L (ref 135–145)
Total Bilirubin: 0.5 mg/dL (ref 0.2–1.2)
Total Protein: 7.3 g/dL (ref 6.0–8.3)

## 2023-04-15 LAB — CBC WITH DIFFERENTIAL/PLATELET
Basophils Absolute: 0 10*3/uL (ref 0.0–0.1)
Basophils Relative: 0.5 % (ref 0.0–3.0)
Eosinophils Absolute: 0.1 10*3/uL (ref 0.0–0.7)
Eosinophils Relative: 1.7 % (ref 0.0–5.0)
HCT: 42.1 % (ref 39.0–52.0)
Hemoglobin: 14.3 g/dL (ref 13.0–17.0)
Lymphocytes Relative: 27.2 % (ref 12.0–46.0)
Lymphs Abs: 1.5 10*3/uL (ref 0.7–4.0)
MCHC: 33.8 g/dL (ref 30.0–36.0)
MCV: 88.7 fl (ref 78.0–100.0)
Monocytes Absolute: 0.6 10*3/uL (ref 0.1–1.0)
Monocytes Relative: 10.5 % (ref 3.0–12.0)
Neutro Abs: 3.3 10*3/uL (ref 1.4–7.7)
Neutrophils Relative %: 60.1 % (ref 43.0–77.0)
Platelets: 191 10*3/uL (ref 150.0–400.0)
RBC: 4.75 Mil/uL (ref 4.22–5.81)
RDW: 14.3 % (ref 11.5–15.5)
WBC: 5.5 10*3/uL (ref 4.0–10.5)

## 2023-04-15 LAB — LIPID PANEL
Cholesterol: 98 mg/dL (ref 0–200)
HDL: 26.5 mg/dL — ABNORMAL LOW (ref >39.00)
LDL Cholesterol: 45 mg/dL (ref 0–99)
NonHDL: 71.09
Total CHOL/HDL Ratio: 4
Triglycerides: 128 mg/dL (ref 0.0–149.0)
VLDL: 25.6 mg/dL (ref 0.0–40.0)

## 2023-04-15 LAB — HEMOGLOBIN A1C: Hgb A1c MFr Bld: 6.6 % — ABNORMAL HIGH (ref 4.6–6.5)

## 2023-04-15 MED ORDER — HYDROCHLOROTHIAZIDE 25 MG PO TABS: 25.0000 mg | ORAL_TABLET | Freq: Every day | ORAL | 1 refills | Status: DC

## 2023-04-15 MED ORDER — RYBELSUS 3 MG PO TABS
3.0000 mg | ORAL_TABLET | Freq: Every day | ORAL | 2 refills | Status: DC
Start: 2023-04-15 — End: 2023-07-25

## 2023-04-15 NOTE — Assessment & Plan Note (Signed)
Chronic °Regular exercise and healthy diet encouraged °Check lipid panel  °Continue atorvastatin 20 mg daily °

## 2023-04-15 NOTE — Addendum Note (Signed)
Addended by: Karma Ganja on: 04/15/2023 04:43 PM   Modules accepted: Orders

## 2023-04-15 NOTE — Assessment & Plan Note (Addendum)
Chronic   Lab Results  Component Value Date   HGBA1C 6.6 (H) 10/15/2022   Sugars controlled Testing sugars and minimally-most recent sugars were very good-low 100s Check A1c Continue Jardiance 10 mg daily, metformin 500 mg twice daily He would benefit from weight loss and he is interested in weight loss He wonders about other options for medications that he knows others are on-start Rybelsus 3 mg daily-discussed possible side effects-can titrate if needed Decrease portions-diet low in sugar/carbs He is walking a lot at work-14,000 steps-continue good activity

## 2023-04-15 NOTE — Assessment & Plan Note (Signed)
Chronic BMI 35.15 kg/m Comorbidities hypertension, diabetes, hyperlipidemia He is walking a lot at work-approximately 14,000 steps a day and he will continue this-stressed the importance of good activity/exercise Advise decreasing portions/calorie intake Discussed diet high in protein and low in sugar/carbs Sugars could be even better controlled than his current medications are allowing so we will start Rybelsus 3 mg daily and titrate

## 2023-04-15 NOTE — Assessment & Plan Note (Signed)
Chronic Blood pressure well controlled CMP, cbc Continue amlodipine 10 mg daily, HCTZ 25 mg daily, lisinopril 40 mg daily

## 2023-04-16 LAB — TSH: TSH: 1.45 u[IU]/mL (ref 0.35–5.50)

## 2023-04-16 LAB — PSA: PSA: 0.7 ng/mL (ref 0.10–4.00)

## 2023-05-10 ENCOUNTER — Encounter: Payer: Self-pay | Admitting: Internal Medicine

## 2023-05-10 ENCOUNTER — Ambulatory Visit: Payer: BC Managed Care – PPO | Admitting: Internal Medicine

## 2023-05-10 VITALS — BP 130/78 | HR 90 | Temp 98.5°F | Ht 71.0 in | Wt 252.0 lb

## 2023-05-10 DIAGNOSIS — G8929 Other chronic pain: Secondary | ICD-10-CM | POA: Diagnosis not present

## 2023-05-10 DIAGNOSIS — M25561 Pain in right knee: Secondary | ICD-10-CM | POA: Diagnosis not present

## 2023-05-10 DIAGNOSIS — I10 Essential (primary) hypertension: Secondary | ICD-10-CM | POA: Diagnosis not present

## 2023-05-10 DIAGNOSIS — M25562 Pain in left knee: Secondary | ICD-10-CM | POA: Diagnosis not present

## 2023-05-10 NOTE — Assessment & Plan Note (Signed)
Chronic Worse recently - more of a discomfort in knee and Pressure behind knees Discomfort after he gets up and walks No obvious swelling Likely mild arthritis Discussed treatment options-pain is not significant and would like to treat conservatively Currently taking Aleve-advised to take Aleve on occasion, but try taking Tylenol if he needs something more consistently Ice, topical medications Stressed regular exercise, working on the muscles surrounding the knees and strengthening on Stressed weight loss-1 pound lost decreases pressure in knees by 4 pounds He deferred x-rays at this time-continue in the future If knee symptoms are getting worse we will consider sports medicine referral

## 2023-05-10 NOTE — Patient Instructions (Addendum)
one pound of weight loss resulted in four pounds of pressure being removed from the knee   You can take 3000 mg of tylenol a day safely   Exercise helps the arthritis Ice the knees as needed Work on weight loss Strengthen your muscle surrounding the knee joint You can take natural anti-inflammatories - tumeric, fish oil or tart cherry supplements Wear good shoes   Medications changes include :   avoid too much aleve     Arthritis Arthritis is a term that is commonly used to refer to joint pain or joint disease. There are more than 100 types of arthritis. What are the causes? The most common cause of this condition is wear and tear of a joint. Other causes include: Gout. Inflammation of a joint. An infection of a joint. Sprains and other injuries near the joint. A reaction to medicines or drugs, or an allergic reaction. In some cases, the cause may not be known. What are the signs or symptoms? The main symptom of this condition is pain in the joint during movement. Other symptoms include: Redness, swelling, or stiffness at a joint. Warmth coming from the joint. Fever. Overall feeling of illness. How is this diagnosed? This condition may be diagnosed with a physical exam and tests, including: Blood tests. Urine tests. Imaging tests, such as X-rays, an MRI, or a CT scan. Sometimes, fluid is removed from a joint for testing. How is this treated? This condition may be treated with: Treatment of the cause, if it is known. Rest. Raising (elevating) the joint. Applying cold or hot packs to the joint. Medicines to improve symptoms and reduce inflammation. Injections of a steroid, such as cortisone, into the joint to help reduce pain and inflammation. Depending on the cause of your arthritis, you may need to make lifestyle changes to reduce stress on your joint. Changes may include: Exercising more. Losing weight. Follow these instructions at home: Medicines Take  over-the-counter and prescription medicines only as told by your health care provider. Do not take aspirin to relieve pain if your health care provider thinks that gout may be causing your pain. Activity Rest your joint if told by your health care provider. Rest is important when your disease is active and your joint feels painful, swollen, or stiff. Avoid activities that make the pain worse. Balance activity with rest. Exercise your joint regularly with range-of-motion exercises as told by your health care provider. Try doing low-impact exercise, such as: Swimming. Water aerobics. Biking. Walking. Managing pain, stiffness, and swelling     If directed, put ice on the affected joint. To do this: Put ice in a plastic bag. Place a towel between your skin and the bag. Leave the ice on for 20 minutes, 2-3 times a day. Remove the ice if your skin turns bright red. This is very important. If you cannot feel pain, heat, or cold, you have a greater risk of damage to the area. If your joint is swollen, raise (elevate) it above the level of your heart if directed by your health care provider. If your joint feels stiff in the morning, try taking a warm shower. If directed, apply heat to the affected area as often as told by your health care provider. Use the heat source that your health care provider recommends, such as a moist heat pack or a heating pad. To apply heat: Place a towel between your skin and the heat source. Leave the heat on for 20-30 minutes. Remove the heat if  your skin turns bright red. This is especially important if you are unable to feel pain, heat, or cold. You have a greater risk of getting burned. General instructions Maintain a healthy weight. Follow instructions from your health care provider for weight control. Do not use any products that contain nicotine or tobacco. These products include cigarettes, chewing tobacco, and vaping devices, such as e-cigarettes. If you need  help quitting, ask your health care provider. Keep all follow-up visits. This is important. Where to find more information [employer]: www.niams.http://www.myers.net/ Contact a health care provider if: The pain gets worse. You have a fever. Get help right away if: You develop severe joint pain, swelling, or redness. Many joints become painful and swollen. You develop severe back pain. You develop severe weakness in your leg. Summary Arthritis is a term that is commonly used to refer to joint pain or joint disease. There are more than 100 types of arthritis. The most common cause of this condition is wear and tear of a joint. Other causes include gout, inflammation or infection of the joint, sprains, or allergies. Symptoms of this condition include redness, swelling, or stiffness of the joint. Other symptoms include warmth, fever, or feeling ill. This condition is treated with rest, elevation, medicines, and applying cold or hot packs. Follow your health care provider's instructions about medicines, activity, exercises, and other home care treatments. This information is not intended to replace advice given to you by your health care provider. Make sure you discuss any questions you have with your health care provider. Document Revised: 09/22/2021 Document Reviewed: 09/22/2021 Elsevier Patient Education  2023 [employer].

## 2023-05-10 NOTE — Assessment & Plan Note (Signed)
Chronic Blood pressure well controlled Continue amlodipine 10 mg daily, HCTZ 25 mg daily, lisinopril 40 mg daily

## 2023-05-10 NOTE — Progress Notes (Signed)
Subjective:    Patient ID: Chris Watkins, male    DOB: [birth year], 52 y.o.   MRN: [id number]      HPI Ritter is here for  Chief Complaint  Patient presents with   Knee Pain    Pain in both knees (tightness sometimes) hears popping in knees     B/l knee pain - x 2-3 years.  Recently having pressure behind knees - this is new.  Knees do not swell.  If he sits for a while and he gets up he has discomfort -- occ pain.  Sometimes when he walks his knees will be popping.    He takes aleve as needed - it helps.  Takes it prn.  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      Medications and allergies reviewed with patient and updated if appropriate.  Current Outpatient Medications on File Prior to Visit  Medication Sig Dispense Refill   amLODipine (NORVASC) 10 MG tablet TAKE 1 TABLET BY MOUTH EVERY DAY 90 tablet 3   atorvastatin (LIPITOR) 20 MG tablet TAKE 1 TABLET BY MOUTH EVERY DAY 90 tablet 2   clotrimazole-betamethasone (LOTRISONE) cream Apply 1 Application topically daily. 45 g 5   glucose blood (FREESTYLE LITE) test strip Use to check blood sugars twice a day. Dx E11.9 100 each 3   hydrochlorothiazide (HYDRODIURIL) 25 MG tablet Take 1 tablet (25 mg total) by mouth daily. 90 tablet 1   hydrOXYzine (ATARAX) 25 MG tablet Take 1 tablet (25 mg total) by mouth every 6 (six) hours as needed for itching. 20 tablet 0   JARDIANCE 10 MG TABS tablet TAKE 1 TABLET BY MOUTH EVERY DAY 90 tablet 2   Lancet Devices (ONE TOUCH DELICA LANCING DEV) MISC UAD to check sugars.  E11.9 1 each 2   Lancets (FREESTYLE) lancets Use as directed to check sugars daily   E11.9 100 each 12   lisinopril (ZESTRIL) 40 MG tablet TAKE 1 TABLET BY MOUTH EVERY DAY 90 tablet 3   metFORMIN (GLUCOPHAGE-XR) 500 MG 24 hr tablet TAKE 1 TABLET (500 MG TOTAL) BY MOUTH IN THE MORNING AND AT BEDTIME 180 tablet 2   Semaglutide (RYBELSUS) 3 MG TABS Take 1 tablet (3 mg total) by mouth daily. 30  tablet 2   tacrolimus (PROTOPIC) 0.1 % ointment Apply topically 2 (two) times daily. 30 g 0   triamcinolone cream (KENALOG) 0.1 % Apply 1 application  topically 2 (two) times daily. Apply for 2 weeks. May use on face 30 g 0   No current facility-administered medications on file prior to visit.    Review of Systems     Objective:   Vitals:   05/10/23 1540  BP: 130/78  Pulse: 90  Temp: 98.5 F (36.9 C)  SpO2: 98%   BP Readings from Last 3 Encounters:  05/10/23 130/78  04/15/23 130/62  10/15/22 130/62   Wt Readings from Last 3 Encounters:  05/10/23 252 lb (114.3 kg)  04/15/23 252 lb (114.3 kg)  10/15/22 244 lb (110.7 kg)   Body mass index is 35.15 kg/m.    Physical Exam Constitutional:      General: He is not in acute distress.    Appearance: Normal appearance. He is not ill-appearing.  HENT:     Head: Normocephalic and atraumatic.  Musculoskeletal:        General: No swelling, tenderness (no tenderness throughout knee or posterior knee.  no calf  tendneress) or deformity. Normal range of motion.     Right lower leg: No edema.     Left lower leg: No edema.  Skin:    General: Skin is warm and dry.  Neurological:     Mental Status: He is alert.     Sensory: No sensory deficit.            Assessment & Plan:    See Problem List for Assessment and Plan of chronic medical problems.

## 2023-05-20 ENCOUNTER — Other Ambulatory Visit: Payer: Self-pay

## 2023-05-20 ENCOUNTER — Encounter: Payer: Self-pay | Admitting: Internal Medicine

## 2023-05-20 MED ORDER — RYBELSUS 7 MG PO TABS: 7.0000 mg | ORAL_TABLET | Freq: Every day | ORAL | 2 refills | Status: DC

## 2023-07-22 ENCOUNTER — Encounter: Payer: Self-pay | Admitting: Internal Medicine

## 2023-07-25 MED ORDER — RYBELSUS 14 MG PO TABS
14.0000 mg | ORAL_TABLET | Freq: Every day | ORAL | 5 refills | Status: DC
Start: 2023-07-25 — End: 2024-10-19

## 2023-10-05 ENCOUNTER — Other Ambulatory Visit: Payer: Self-pay | Admitting: Internal Medicine

## 2023-10-06 ENCOUNTER — Other Ambulatory Visit: Payer: Self-pay | Admitting: Internal Medicine

## 2023-10-07 ENCOUNTER — Other Ambulatory Visit: Payer: Self-pay | Admitting: Internal Medicine

## 2023-10-12 ENCOUNTER — Other Ambulatory Visit: Payer: Self-pay | Admitting: Internal Medicine

## 2023-10-12 DIAGNOSIS — E119 Type 2 diabetes mellitus without complications: Secondary | ICD-10-CM

## 2023-10-20 ENCOUNTER — Encounter: Payer: Self-pay | Admitting: Internal Medicine

## 2023-10-20 NOTE — Progress Notes (Signed)
Subjective:    Patient ID: Chris Watkins, male    DOB: 1971/03/29, 52 y.o.   MRN: [id number]     HPI Chris Watkins is here for follow up of his chronic medical problems.  Walks a lot at work.  Feels like he is eating well.  No concerns.  Medications and allergies reviewed with patient and updated if appropriate.  Current Outpatient Medications on File Prior to Visit  Medication Sig Dispense Refill   amLODipine (NORVASC) 10 MG tablet TAKE 1 TABLET BY MOUTH EVERY DAY 90 tablet 3   atorvastatin (LIPITOR) 20 MG tablet TAKE 1 TABLET BY MOUTH EVERY DAY 90 tablet 2   clotrimazole-betamethasone (LOTRISONE) cream Apply 1 Application topically daily. 45 g 5   glucose blood (FREESTYLE LITE) test strip Use to check blood sugars twice a day. Dx E11.9 100 each 3   hydrochlorothiazide (HYDRODIURIL) 25 MG tablet Take 1 tablet (25 mg total) by mouth daily. 90 tablet 1   hydrOXYzine (ATARAX) 25 MG tablet Take 1 tablet (25 mg total) by mouth every 6 (six) hours as needed for itching. 20 tablet 0   JARDIANCE 10 MG TABS tablet TAKE 1 TABLET BY MOUTH EVERY DAY 90 tablet 2   Lancet Devices (ONE TOUCH DELICA LANCING DEV) MISC UAD to check sugars.  E11.9 1 each 2   Lancets (FREESTYLE) lancets Use as directed to check sugars daily   E11.9 100 each 12   lisinopril (ZESTRIL) 40 MG tablet TAKE 1 TABLET BY MOUTH EVERY DAY 90 tablet 3   metFORMIN (GLUCOPHAGE-XR) 500 MG 24 hr tablet TAKE 1 TABLET (500 MG TOTAL) BY MOUTH IN THE MORNING AND AT BEDTIME 180 tablet 2   Semaglutide (RYBELSUS) 14 MG TABS Take 1 tablet (14 mg total) by mouth daily. 30 tablet 5   tacrolimus (PROTOPIC) 0.1 % ointment APPLY TOPICALLY TWICE A DAY 30 g 0   triamcinolone cream (KENALOG) 0.1 % Apply 1 application  topically 2 (two) times daily. Apply for 2 weeks. May use on face 30 g 0   No current facility-administered medications on file prior to visit.     Review of Systems  Constitutional:  Negative for fever.  Respiratory:  Negative  for cough, shortness of breath and wheezing.   Cardiovascular:  Negative for chest pain, palpitations and leg swelling.  Neurological:  Negative for light-headedness and headaches.       Objective:   Vitals:   10/21/23 1515  BP: 118/76  Pulse: 85  Temp: 98 F (36.7 C)  SpO2: 98%   BP Readings from Last 3 Encounters:  10/21/23 118/76  05/10/23 130/78  04/15/23 130/62   Wt Readings from Last 3 Encounters:  10/21/23 247 lb (112 kg)  05/10/23 252 lb (114.3 kg)  04/15/23 252 lb (114.3 kg)   Body mass index is 34.45 kg/m.    Physical Exam Constitutional:      General: He is not in acute distress.    Appearance: Normal appearance. He is not ill-appearing.  HENT:     Head: Normocephalic and atraumatic.  Eyes:     Conjunctiva/sclera: Conjunctivae normal.  Cardiovascular:     Rate and Rhythm: Normal rate and regular rhythm.     Heart sounds: Normal heart sounds.  Pulmonary:     Effort: Pulmonary effort is normal. No respiratory distress.     Breath sounds: Normal breath sounds. No wheezing or rales.  Musculoskeletal:     Right lower leg: No edema.  Left lower leg: No edema.  Skin:    General: Skin is warm and dry.     Findings: No rash.  Neurological:     Mental Status: He is alert. Mental status is at baseline.  Psychiatric:        Mood and Affect: Mood normal.        Lab Results  Component Value Date   WBC 5.5 04/15/2023   HGB 14.3 04/15/2023   HCT 42.1 04/15/2023   PLT 191.0 04/15/2023   GLUCOSE 103 (H) 04/15/2023   CHOL 98 04/15/2023   TRIG 128.0 04/15/2023   HDL 26.50 (L) 04/15/2023   LDLCALC 45 04/15/2023   ALT 18 04/15/2023   AST 20 04/15/2023   NA 136 04/15/2023   K 4.0 04/15/2023   CL 99 04/15/2023   CREATININE 0.66 04/15/2023   BUN 15 04/15/2023   CO2 28 04/15/2023   TSH 1.45 04/15/2023   PSA 0.70 04/15/2023   HGBA1C 6.6 (H) 04/15/2023   MICROALBUR <0.7 04/09/2022     Assessment & Plan:   Shingles vaccine #2 given  See Problem  List for Assessment and Plan of chronic medical problems.

## 2023-10-20 NOTE — Patient Instructions (Addendum)
    Shingles vaccine #2 given.     Blood work was ordered.   The lab is on the first floor.    Medications changes include :   none     Return in about 6 months (around 04/20/2024) for Physical Exam.

## 2023-10-21 ENCOUNTER — Ambulatory Visit: Payer: BC Managed Care – PPO | Admitting: Internal Medicine

## 2023-10-21 VITALS — BP 118/76 | HR 85 | Temp 98.0°F | Wt 247.0 lb

## 2023-10-21 DIAGNOSIS — Z125 Encounter for screening for malignant neoplasm of prostate: Secondary | ICD-10-CM | POA: Diagnosis not present

## 2023-10-21 DIAGNOSIS — Z23 Encounter for immunization: Secondary | ICD-10-CM | POA: Diagnosis not present

## 2023-10-21 DIAGNOSIS — G8929 Other chronic pain: Secondary | ICD-10-CM

## 2023-10-21 DIAGNOSIS — E782 Mixed hyperlipidemia: Secondary | ICD-10-CM | POA: Diagnosis not present

## 2023-10-21 DIAGNOSIS — Z7984 Long term (current) use of oral hypoglycemic drugs: Secondary | ICD-10-CM

## 2023-10-21 DIAGNOSIS — M25561 Pain in right knee: Secondary | ICD-10-CM

## 2023-10-21 DIAGNOSIS — I10 Essential (primary) hypertension: Secondary | ICD-10-CM

## 2023-10-21 DIAGNOSIS — E1165 Type 2 diabetes mellitus with hyperglycemia: Secondary | ICD-10-CM

## 2023-10-21 DIAGNOSIS — L309 Dermatitis, unspecified: Secondary | ICD-10-CM

## 2023-10-21 DIAGNOSIS — M25562 Pain in left knee: Secondary | ICD-10-CM

## 2023-10-21 LAB — LIPID PANEL
Cholesterol: 106 mg/dL (ref 0–200)
HDL: 29.8 mg/dL — ABNORMAL LOW (ref >39.00)
LDL Cholesterol: 58 mg/dL (ref 0–99)
NonHDL: 76.33
Total CHOL/HDL Ratio: 4
Triglycerides: 92 mg/dL (ref 0.0–149.0)
VLDL: 18.4 mg/dL (ref 0.0–40.0)

## 2023-10-21 LAB — MICROALBUMIN / CREATININE URINE RATIO
Creatinine,U: 114.7 mg/dL
Microalb Creat Ratio: 0.6 mg/g (ref 0.0–30.0)
Microalb, Ur: <0.7 mg/dL (ref 0.0–1.9)

## 2023-10-21 LAB — COMPREHENSIVE METABOLIC PANEL
ALT: 16 U/L (ref 0–53)
AST: 16 U/L (ref 0–37)
Albumin: 4.3 g/dL (ref 3.5–5.2)
Alkaline Phosphatase: 94 U/L (ref 39–117)
BUN: 17 mg/dL (ref 6–23)
CO2: 26 meq/L (ref 19–32)
Calcium: 9.2 mg/dL (ref 8.4–10.5)
Chloride: 101 meq/L (ref 96–112)
Creatinine, Ser: 0.71 mg/dL (ref 0.40–1.50)
GFR: 105.01 mL/min (ref >60.00)
Glucose, Bld: 92 mg/dL (ref 70–99)
Potassium: 3.8 meq/L (ref 3.5–5.1)
Sodium: 138 meq/L (ref 135–145)
Total Bilirubin: 0.5 mg/dL (ref 0.2–1.2)
Total Protein: 7.3 g/dL (ref 6.0–8.3)

## 2023-10-21 LAB — HEMOGLOBIN A1C: Hgb A1c MFr Bld: 6.6 % — ABNORMAL HIGH (ref 4.6–6.5)

## 2023-10-21 LAB — PSA: PSA: 0.75 ng/mL (ref 0.10–4.00)

## 2023-10-21 MED ORDER — TRIAMCINOLONE ACETONIDE 0.1 % EX CREA
1.0000 | TOPICAL_CREAM | Freq: Two times a day (BID) | CUTANEOUS | 2 refills | Status: DC
Start: 2023-10-21 — End: 2024-10-19

## 2023-10-21 NOTE — Assessment & Plan Note (Signed)
Chronic  Lab Results  Component Value Date   HGBA1C 6.6 (H) 04/15/2023   Sugars controlled Check A1c, urine microalbumin Continue Jardiance 10 mg daily, metformin 500 mg twice daily, Rybelsus 14 mg daily Continue weight loss efforts Decrease portions-diet low in sugar/carbs He is walking a lot at work-14,000 steps-continue good activity

## 2023-10-21 NOTE — Assessment & Plan Note (Addendum)
Chronic Intermittent flares Continue topical steroid creams or Protopic as needed

## 2023-10-21 NOTE — Assessment & Plan Note (Signed)
Chronic BMI 34.45 Comorbidities hypertension, diabetes, hyperlipidemia He is walking a lot at work Advise decreasing portions/calorie intake He has lost 5 pounds Encouraged continued weight loss

## 2023-10-21 NOTE — Assessment & Plan Note (Signed)
Chronic Blood pressure well controlled Continue amlodipine 10 mg daily, HCTZ 25 mg daily, lisinopril 40 mg daily CMP

## 2023-10-21 NOTE — Assessment & Plan Note (Signed)
Chronic Regular exercise and healthy diet encouraged Check lipid panel, CMP Continue atorvastatin 20 mg daily 

## 2023-10-21 NOTE — Assessment & Plan Note (Signed)
Chronic Likely related to OA Has lost a little weight-encouraged further weight loss which would benefit his knees Walks a lot at work Taking Tylenol as needed and that is helping

## 2023-11-02 ENCOUNTER — Encounter: Payer: Self-pay | Admitting: Gastroenterology

## 2023-11-14 ENCOUNTER — Encounter: Payer: Self-pay | Admitting: Internal Medicine

## 2024-01-10 ENCOUNTER — Encounter: Payer: Self-pay | Admitting: Gastroenterology

## 2024-01-18 ENCOUNTER — Other Ambulatory Visit: Payer: Self-pay | Admitting: Internal Medicine

## 2024-01-18 DIAGNOSIS — E119 Type 2 diabetes mellitus without complications: Secondary | ICD-10-CM

## 2024-01-18 DIAGNOSIS — I10 Essential (primary) hypertension: Secondary | ICD-10-CM

## 2024-01-29 ENCOUNTER — Other Ambulatory Visit: Payer: Self-pay | Admitting: Internal Medicine

## 2024-02-03 ENCOUNTER — Ambulatory Visit (AMBULATORY_SURGERY_CENTER): Payer: BC Managed Care – PPO

## 2024-02-03 VITALS — Ht 71.0 in | Wt 240.0 lb

## 2024-02-03 DIAGNOSIS — Z860100 Personal history of colon polyps, unspecified: Secondary | ICD-10-CM

## 2024-02-03 MED ORDER — SUFLAVE 178.7 G PO SOLR: 1.0000 | ORAL | 0 refills | Status: AC

## 2024-02-03 NOTE — Progress Notes (Signed)

## 2024-02-21 ENCOUNTER — Encounter: Payer: Self-pay | Admitting: Gastroenterology

## 2024-02-24 ENCOUNTER — Ambulatory Visit (AMBULATORY_SURGERY_CENTER): Payer: BC Managed Care – PPO | Admitting: Gastroenterology

## 2024-02-24 ENCOUNTER — Encounter: Payer: Self-pay | Admitting: Gastroenterology

## 2024-02-24 VITALS — BP 127/78 | HR 62 | Temp 97.5°F | Resp 17 | Ht 71.0 in | Wt 240.0 lb

## 2024-02-24 DIAGNOSIS — Z860100 Personal history of colon polyps, unspecified: Secondary | ICD-10-CM

## 2024-02-24 DIAGNOSIS — K635 Polyp of colon: Secondary | ICD-10-CM

## 2024-02-24 DIAGNOSIS — K621 Rectal polyp: Secondary | ICD-10-CM | POA: Diagnosis not present

## 2024-02-24 DIAGNOSIS — D128 Benign neoplasm of rectum: Secondary | ICD-10-CM

## 2024-02-24 DIAGNOSIS — K5730 Diverticulosis of large intestine without perforation or abscess without bleeding: Secondary | ICD-10-CM | POA: Diagnosis not present

## 2024-02-24 DIAGNOSIS — Z1211 Encounter for screening for malignant neoplasm of colon: Secondary | ICD-10-CM

## 2024-02-24 DIAGNOSIS — K644 Residual hemorrhoidal skin tags: Secondary | ICD-10-CM

## 2024-02-24 DIAGNOSIS — K648 Other hemorrhoids: Secondary | ICD-10-CM

## 2024-02-24 DIAGNOSIS — D125 Benign neoplasm of sigmoid colon: Secondary | ICD-10-CM

## 2024-02-24 DIAGNOSIS — D122 Benign neoplasm of ascending colon: Secondary | ICD-10-CM

## 2024-02-24 MED ORDER — SODIUM CHLORIDE 0.9 % IV SOLN
500.0000 mL | Freq: Once | INTRAVENOUS | Status: DC
Start: 2024-02-24 — End: 2024-02-24

## 2024-02-24 NOTE — Progress Notes (Signed)
 Pt's states no medical or surgical changes since previsit or office visit.

## 2024-02-24 NOTE — Patient Instructions (Addendum)

## 2024-02-24 NOTE — Op Note (Signed)
  [facility] Patient Name: Chris Watkins Procedure Date: 02/24/2024 9:38 AM MRN: [id number] Endoscopist: Napoleon Form , MD, [id number] Age: 53 Referring MD:  Date of Birth: Apr 11, 1971 Gender: Male Account #: [id number] Procedure:                Colonoscopy Indications:              High risk colon cancer surveillance: Personal                            history of adenoma (10 mm or greater in size), High                            risk colon cancer surveillance: Personal history of                            multiple (3 or more) adenomas Medicines:                Monitored Anesthesia Care Procedure:                Pre-Anesthesia Assessment:                           - Prior to the procedure, a History and Physical                            was performed, and patient medications and                            allergies were reviewed. The patient's tolerance of                            previous anesthesia was also reviewed. The risks                            and benefits of the procedure and the sedation                            options and risks were discussed with the patient.                            All questions were answered, and informed consent                            was obtained. Prior Anticoagulants: The patient has                            taken no anticoagulant or antiplatelet agents. ASA                            Grade Assessment: II - A patient with mild systemic                            disease. After reviewing the risks and benefits,  the patient was deemed in satisfactory condition to                            undergo the procedure.                           After obtaining informed consent, the colonoscope                            was passed under direct vision. Throughout the                            procedure, the patient's blood pressure, pulse, and                            oxygen saturations were  monitored continuously. The                            [id number]-[id number] Colonoscope [id number] was introduced                            through the anus and advanced to the the cecum,                            identified by appendiceal orifice and ileocecal                            valve. The colonoscopy was performed without                            difficulty. The patient tolerated the procedure                            well. The quality of the bowel preparation was                            good. The ileocecal valve, appendiceal orifice, and                            rectum were photographed. Scope In: 9:48:08 AM Scope Out: 10:08:40 AM Scope Withdrawal Time: 0 hours 15 minutes 51 seconds  Total Procedure Duration: 0 hours 20 minutes 32 seconds  Findings:                 The perianal and digital rectal examinations were                            normal.                           Three sessile polyps were found in the rectum,                            sigmoid colon and ascending colon. The polyps were  3 to 5 mm in size. These polyps were removed with a                            cold snare. Resection and retrieval were complete.                           Scattered large-mouthed, medium-mouthed and                            small-mouthed diverticula were found in the sigmoid                            colon.                           Non-bleeding external and internal hemorrhoids were                            found during retroflexion. The hemorrhoids were                            medium-sized. Complications:            No immediate complications. Estimated Blood Loss:     Estimated blood loss was minimal. Impression:               - Three 3 to 5 mm polyps in the rectum, in the                            sigmoid colon and in the ascending colon, removed                            with a cold snare. Resected and retrieved.                           -  Diverticulosis in the sigmoid colon.                           - Non-bleeding external and internal hemorrhoids. Recommendation:           - Patient has a contact number available for                            emergencies. The signs and symptoms of potential                            delayed complications were discussed with the                            patient. Return to normal activities tomorrow.                            Written discharge instructions were provided to the                            patient.                           -  Resume previous diet.                           - Continue present medications.                           - Await pathology results.                           - Repeat colonoscopy in 5 years for surveillance                            based on pathology results. Napoleon Form, MD 02/24/2024 10:13:01 AM This report has been signed electronically.

## 2024-02-24 NOTE — Progress Notes (Signed)
  Gastroenterology History and Physical   Primary Care Physician:  Pincus Sanes, MD   Reason for Procedure:  History of adenomatous colon polyps  Plan:    Surveillance colonoscopy with possible interventions as needed     HPI: Chris Watkins is a very pleasant 53 y.o. male here for surveillance colonoscopy. Denies any nausea, vomiting, abdominal pain, melena or bright red blood per rectum  The risks and benefits as well as alternatives of endoscopic procedure(s) have been discussed and reviewed. All questions answered. The patient agrees to proceed.    Past Medical History:  Diagnosis Date   Chicken pox    Diabetes mellitus without complication (HCC)    Hyperlipidemia    Hypertension    Sleep apnea    wears CPAP    Past Surgical History:  Procedure Laterality Date   HERNIA REPAIR     age[age]   SLIPPED CAPITAL FEMORAL EPIPHYSIS PINNING Bilateral    in 6th grade    Prior to Admission medications   Medication Sig Start Date End Date Taking? Authorizing Provider  amLODipine (NORVASC) 10 MG tablet TAKE 1 TABLET BY MOUTH EVERY DAY 01/19/24  Yes Burns, Bobette Mo, MD  atorvastatin (LIPITOR) 20 MG tablet TAKE 1 TABLET BY MOUTH EVERY DAY 10/12/23  Yes Burns, Bobette Mo, MD  glucose blood (FREESTYLE LITE) test strip Use to check blood sugars twice a day. Dx E11.9 08/25/18  Yes Dragnev, Alphonse Guild, NP  hydrochlorothiazide (HYDRODIURIL) 25 MG tablet Take 1 tablet (25 mg total) by mouth daily. 04/15/23  Yes Burns, Bobette Mo, MD  JARDIANCE 10 MG TABS tablet TAKE 1 TABLET BY MOUTH EVERY DAY 01/19/24  Yes Burns, Bobette Mo, MD  Lancet Devices (ONE TOUCH DELICA LANCING DEV) MISC UAD to check sugars.  E11.9 10/15/22  Yes Burns, Bobette Mo, MD  Lancets (FREESTYLE) lancets Use as directed to check sugars daily   E11.9 10/07/21  Yes Burns, Bobette Mo, MD  lisinopril (ZESTRIL) 40 MG tablet TAKE 1 TABLET BY MOUTH EVERY DAY 01/30/24  Yes Burns, Bobette Mo, MD  metFORMIN (GLUCOPHAGE-XR) 500 MG 24 hr  tablet TAKE 1 TABLET (500 MG TOTAL) BY MOUTH IN THE MORNING AND AT BEDTIME 01/19/24  Yes Burns, Bobette Mo, MD  PEG 3350-KCl-NaCl-NaSulf-MgSul (SUFLAVE) 178.7 g SOLR Take 1 kit by mouth as directed. 02/03/24  Yes Deron Poole, Eleonore Chiquito, MD  clotrimazole-betamethasone (LOTRISONE) cream Apply 1 Application topically daily. Patient not taking: Reported on 02/24/2024 10/15/22   Pincus Sanes, MD  hydrOXYzine (ATARAX) 25 MG tablet Take 1 tablet (25 mg total) by mouth every 6 (six) hours as needed for itching. Patient not taking: Reported on 02/24/2024 06/04/22   Domenick Gong, MD  Semaglutide (RYBELSUS) 14 MG TABS Take 1 tablet (14 mg total) by mouth daily. Patient not taking: Reported on 02/24/2024 07/25/23   Pincus Sanes, MD  tacrolimus (PROTOPIC) 0.1 % ointment APPLY TOPICALLY TWICE A DAY Patient not taking: Reported on 02/24/2024 10/06/23   Pincus Sanes, MD  triamcinolone cream (KENALOG) 0.1 % Apply 1 Application topically 2 (two) times daily. Apply for 2 weeks. May use on face 10/21/23   Pincus Sanes, MD    Current Outpatient Medications  Medication Sig Dispense Refill   amLODipine (NORVASC) 10 MG tablet TAKE 1 TABLET BY MOUTH EVERY DAY 90 tablet 3   atorvastatin (LIPITOR) 20 MG tablet TAKE 1 TABLET BY MOUTH EVERY DAY 90 tablet 2   glucose blood (FREESTYLE LITE) test strip Use to check blood sugars  twice a day. Dx E11.9 100 each 3   hydrochlorothiazide (HYDRODIURIL) 25 MG tablet Take 1 tablet (25 mg total) by mouth daily. 90 tablet 1   JARDIANCE 10 MG TABS tablet TAKE 1 TABLET BY MOUTH EVERY DAY 90 tablet 2   Lancet Devices (ONE TOUCH DELICA LANCING DEV) MISC UAD to check sugars.  E11.9 1 each 2   Lancets (FREESTYLE) lancets Use as directed to check sugars daily   E11.9 100 each 12   lisinopril (ZESTRIL) 40 MG tablet TAKE 1 TABLET BY MOUTH EVERY DAY 90 tablet 3   metFORMIN (GLUCOPHAGE-XR) 500 MG 24 hr tablet TAKE 1 TABLET (500 MG TOTAL) BY MOUTH IN THE MORNING AND AT BEDTIME 180 tablet 2   PEG  3350-KCl-NaCl-NaSulf-MgSul (SUFLAVE) 178.7 g SOLR Take 1 kit by mouth as directed. 1 each 0   clotrimazole-betamethasone (LOTRISONE) cream Apply 1 Application topically daily. (Patient not taking: Reported on 02/24/2024) 45 g 5   hydrOXYzine (ATARAX) 25 MG tablet Take 1 tablet (25 mg total) by mouth every 6 (six) hours as needed for itching. (Patient not taking: Reported on 02/24/2024) 20 tablet 0   Semaglutide (RYBELSUS) 14 MG TABS Take 1 tablet (14 mg total) by mouth daily. (Patient not taking: Reported on 02/24/2024) 30 tablet 5   tacrolimus (PROTOPIC) 0.1 % ointment APPLY TOPICALLY TWICE A DAY (Patient not taking: Reported on 02/24/2024) 30 g 0   triamcinolone cream (KENALOG) 0.1 % Apply 1 Application topically 2 (two) times daily. Apply for 2 weeks. May use on face 80 g 2   Current Facility-Administered Medications  Medication Dose Route Frequency Provider Last Rate Last Admin   0.9 %  sodium chloride infusion  500 mL Intravenous Once Napoleon Form, MD        Allergies as of 02/24/2024   (No Known Allergies)    Family History  Problem Relation Age of Onset   Thyroid disease Mother    Hypertension Father    Diabetes Father    Diabetes Maternal Grandmother    Colon cancer Neg Hx    Esophageal cancer Neg Hx    Stomach cancer Neg Hx    Rectal cancer Neg Hx     Social History   Socioeconomic History   Marital status: Married    Spouse name: Not on file   Number of children: 3   Years of education: 14   Highest education level: Not on file  Occupational History   Occupation: Wellsite geologist  Tobacco Use   Smoking status: Former    Current packs/day: 0.00    Types: Cigarettes    Quit date: 02/27/2018    Years since quitting: 5.9   Smokeless tobacco: Never  Vaping Use   Vaping status: Never Used  Substance and Sexual Activity   Alcohol use: Not Currently    Comment: Has not since he was diagnosed with DM in 01/2015   Drug use: No   Sexual activity: Not on file   Other Topics Concern   Not on file  Social History Narrative   Lives with his wife and children. Fun: Play golf, sing, listen to music, watch sports.     Denies religious beliefs effecting health care.    Social Drivers of Corporate investment banker Strain: Not on file  Food Insecurity: Not on file  Transportation Needs: Not on file  Physical Activity: Not on file  Stress: Not on file  Social Connections: Not on file  Intimate Partner Violence: Not on file  Review of Systems:  All other review of systems negative except as mentioned in the HPI.  Physical Exam: Vital signs in last 24 hours: BP (!) 143/87   Pulse 77   Temp (!) 97.5 F (36.4 C) (Temporal)   Ht 5\' 11"  (1.803 m)   Wt 240 lb (108.9 kg)   SpO2 97%   BMI 33.47 kg/m  General:   Alert, NAD Lungs:  Clear .   Heart:  Regular rate and rhythm Abdomen:  Soft, nontender and nondistended. Neuro/Psych:  Alert and cooperative. Normal mood and affect. A and O x 3  Reviewed labs, radiology imaging, old records and pertinent past GI work up  Patient is appropriate for planned procedure(s) and anesthesia in an ambulatory setting   K. Scherry Ran , MD [phone]

## 2024-02-24 NOTE — Progress Notes (Signed)
 Called to room to assist during endoscopic procedure.  Patient ID and intended procedure confirmed with present staff. Received instructions for my participation in the procedure from the performing physician.

## 2024-02-24 NOTE — Progress Notes (Signed)
 Report to PACU, RN, vss, BBS= Clear.

## 2024-02-25 ENCOUNTER — Other Ambulatory Visit: Payer: Self-pay | Admitting: Internal Medicine

## 2024-02-25 DIAGNOSIS — I10 Essential (primary) hypertension: Secondary | ICD-10-CM

## 2024-02-27 ENCOUNTER — Telehealth: Payer: Self-pay

## 2024-02-27 NOTE — Telephone Encounter (Signed)
 Follow up call to pt, no answer.

## 2024-02-28 LAB — SURGICAL PATHOLOGY

## 2024-03-31 ENCOUNTER — Other Ambulatory Visit: Payer: Self-pay | Admitting: Internal Medicine

## 2024-04-08 ENCOUNTER — Encounter: Payer: Self-pay | Admitting: Internal Medicine

## 2024-04-19 NOTE — Patient Instructions (Addendum)
      Blood work was ordered.       Medications changes include :   None    A referral was ordered and someone will call you to schedule an appointment.     Return in about 6 months (around 10/20/2024) for follow up.

## 2024-04-19 NOTE — Progress Notes (Unsigned)
 Subjective:    Patient ID: Chris Watkins, male    DOB: Jan 02, 1971, 53 y.o.   MRN: [id number]     HPI Toni is here for a physical exam and his chronic medical problems.   Having b/l knee pain.    Medications and allergies reviewed with patient and updated if appropriate.  Current Outpatient Medications on File Prior to Visit  Medication Sig Dispense Refill   amLODipine  (NORVASC ) 10 MG tablet TAKE 1 TABLET BY MOUTH EVERY DAY 90 tablet 3   atorvastatin  (LIPITOR) 20 MG tablet TAKE 1 TABLET BY MOUTH EVERY DAY 90 tablet 2   clotrimazole -betamethasone  (LOTRISONE ) cream APPLY 1 APPLICATION TOPICALLY DAILY 45 g 5   glucose blood (FREESTYLE LITE) test strip Use to check blood sugars twice a day. Dx E11.9 100 each 3   hydrochlorothiazide  (HYDRODIURIL ) 25 MG tablet TAKE 1 TABLET (25 MG TOTAL) BY MOUTH DAILY. 90 tablet 1   JARDIANCE  10 MG TABS tablet TAKE 1 TABLET BY MOUTH EVERY DAY 90 tablet 2   Lancet Devices (ONE TOUCH DELICA LANCING DEV) MISC UAD to check sugars.  E11.9 1 each 2   Lancets (FREESTYLE) lancets Use as directed to check sugars daily   E11.9 100 each 12   lisinopril  (ZESTRIL ) 40 MG tablet TAKE 1 TABLET BY MOUTH EVERY DAY 90 tablet 3   metFORMIN  (GLUCOPHAGE -XR) 500 MG 24 hr tablet TAKE 1 TABLET (500 MG TOTAL) BY MOUTH IN THE MORNING AND AT BEDTIME 180 tablet 2   PEG 3350-KCl-NaCl-NaSulf-MgSul (SUFLAVE ) 178.7 g SOLR Take 1 kit by mouth as directed. 1 each 0   Semaglutide  (RYBELSUS ) 14 MG TABS Take 1 tablet (14 mg total) by mouth daily. 30 tablet 5   triamcinolone  cream (KENALOG ) 0.1 % Apply 1 Application topically 2 (two) times daily. Apply for 2 weeks. May use on face 80 g 2   No current facility-administered medications on file prior to visit.    Review of Systems  Constitutional:  Negative for fever.  Eyes:  Negative for visual disturbance.  Respiratory:  Negative for cough, shortness of breath and wheezing.   Cardiovascular:  Negative for chest pain, palpitations and  leg swelling.  Gastrointestinal:  Negative for abdominal pain, blood in stool, constipation and diarrhea.       No gerd  Genitourinary:  Negative for difficulty urinating, dysuria and hematuria.  Musculoskeletal:  Positive for arthralgias (b/l knee). Negative for back pain.  Skin:  Negative for rash.  Neurological:  Negative for light-headedness, numbness and headaches.  Psychiatric/Behavioral:  Negative for dysphoric mood. The patient is not nervous/anxious.        Objective:   Vitals:   04/20/24 1439  BP: 118/74  Pulse: 78  Temp: 98 F (36.7 C)  SpO2: 98%   Filed Weights   04/20/24 1439  Weight: 247 lb (112 kg)   Body mass index is 34.45 kg/m.  BP Readings from Last 3 Encounters:  04/20/24 118/74  02/24/24 127/78  10/21/23 118/76    Wt Readings from Last 3 Encounters:  04/20/24 247 lb (112 kg)  02/24/24 240 lb (108.9 kg)  02/03/24 240 lb (108.9 kg)      Physical Exam Constitutional: He appears well-developed and well-nourished. No distress.  HENT:  Head: Normocephalic and atraumatic.  Right Ear: External ear normal.  Left Ear: External ear normal.  Normal bilateral ear canals impacted with cerumen, TM bilaterally visualized Mouth/Throat: Oropharynx is clear and moist. Eyes: Conjunctivae and EOM are normal.  Neck: Neck supple.  No tracheal deviation present. No thyromegaly present.  No carotid bruit  Cardiovascular: Normal rate, regular rhythm, normal heart sounds and intact distal pulses.   No murmur heard.  Mild bilateral lower extremity edema. Pulmonary/Chest: Effort normal and breath sounds normal. No respiratory distress. He has no wheezes. He has no rales.  Abdominal: Soft. He exhibits no distension. There is no tenderness.  Genitourinary: deferred  Lymphadenopathy:   He has no cervical adenopathy.  Skin: Skin is warm and dry. He is not diaphoretic.  Psychiatric: He has a normal mood and affect. His behavior is normal.         Assessment & Plan:    Physical exam: Screening blood work  ordered Exercise   walks a lot at work, yard work [employer]  obese - advised weight loss Substance abuse   none   Reviewed recommended immunizations.   Health Maintenance  Topic Date Due   FOOT EXAM  09/04/2022   OPHTHALMOLOGY EXAM  02/22/2024   HEMOGLOBIN A1C  04/20/2024   COVID-19 Vaccine (5 - Pfizer risk 2024-25 season) 05/06/2024 (Originally 03/27/2024)   INFLUENZA VACCINE  07/27/2024   Diabetic kidney evaluation - eGFR measurement  10/20/2024   Diabetic kidney evaluation - Urine ACR  10/20/2024   DTaP/Tdap/Td (2 - Tdap) 04/22/2025   Colonoscopy  02/23/2027   Pneumococcal Vaccine 14-81 Years old  Completed   HIV Screening  Completed   Zoster Vaccines- Shingrix   Completed   HPV VACCINES  Aged Out   Meningococcal B Vaccine  Aged Out   Hepatitis C Screening  Discontinued     See Problem List for Assessment and Plan of chronic medical problems.

## 2024-04-20 ENCOUNTER — Ambulatory Visit: Payer: BC Managed Care – PPO | Admitting: Internal Medicine

## 2024-04-20 ENCOUNTER — Encounter: Payer: Self-pay | Admitting: Internal Medicine

## 2024-04-20 VITALS — BP 118/74 | HR 78 | Temp 98.0°F | Ht 71.0 in | Wt 247.0 lb

## 2024-04-20 DIAGNOSIS — E1165 Type 2 diabetes mellitus with hyperglycemia: Secondary | ICD-10-CM

## 2024-04-20 DIAGNOSIS — E782 Mixed hyperlipidemia: Secondary | ICD-10-CM

## 2024-04-20 DIAGNOSIS — E559 Vitamin D deficiency, unspecified: Secondary | ICD-10-CM

## 2024-04-20 DIAGNOSIS — I10 Essential (primary) hypertension: Secondary | ICD-10-CM | POA: Diagnosis not present

## 2024-04-20 DIAGNOSIS — M25562 Pain in left knee: Secondary | ICD-10-CM

## 2024-04-20 DIAGNOSIS — M25561 Pain in right knee: Secondary | ICD-10-CM

## 2024-04-20 DIAGNOSIS — Z0000 Encounter for general adult medical examination without abnormal findings: Secondary | ICD-10-CM

## 2024-04-20 DIAGNOSIS — G8929 Other chronic pain: Secondary | ICD-10-CM

## 2024-04-20 DIAGNOSIS — L309 Dermatitis, unspecified: Secondary | ICD-10-CM

## 2024-04-20 DIAGNOSIS — Z7984 Long term (current) use of oral hypoglycemic drugs: Secondary | ICD-10-CM

## 2024-04-20 LAB — COMPREHENSIVE METABOLIC PANEL WITH GFR
ALT: 19 U/L (ref 0–53)
AST: 21 U/L (ref 0–37)
Albumin: 4.5 g/dL (ref 3.5–5.2)
Alkaline Phosphatase: 96 U/L (ref 39–117)
BUN: 14 mg/dL (ref 6–23)
CO2: 29 meq/L (ref 19–32)
Calcium: 9.5 mg/dL (ref 8.4–10.5)
Chloride: 100 meq/L (ref 96–112)
Creatinine, Ser: 0.77 mg/dL (ref 0.40–1.50)
GFR: 102.11 mL/min (ref >60.00)
Glucose, Bld: 106 mg/dL — ABNORMAL HIGH (ref 70–99)
Potassium: 4.3 meq/L (ref 3.5–5.1)
Sodium: 137 meq/L (ref 135–145)
Total Bilirubin: 0.5 mg/dL (ref 0.2–1.2)
Total Protein: 8.1 g/dL (ref 6.0–8.3)

## 2024-04-20 LAB — CBC WITH DIFFERENTIAL/PLATELET
Basophils Absolute: 0 10*3/uL (ref 0.0–0.1)
Basophils Relative: 0.5 % (ref 0.0–3.0)
Eosinophils Absolute: 0.1 10*3/uL (ref 0.0–0.7)
Eosinophils Relative: 1.1 % (ref 0.0–5.0)
HCT: 45.3 % (ref 39.0–52.0)
Hemoglobin: 14.9 g/dL (ref 13.0–17.0)
Lymphocytes Relative: 28.7 % (ref 12.0–46.0)
Lymphs Abs: 1.4 10*3/uL (ref 0.7–4.0)
MCHC: 32.9 g/dL (ref 30.0–36.0)
MCV: 90.4 fl (ref 78.0–100.0)
Monocytes Absolute: 0.5 10*3/uL (ref 0.1–1.0)
Monocytes Relative: 10 % (ref 3.0–12.0)
Neutro Abs: 3 10*3/uL (ref 1.4–7.7)
Neutrophils Relative %: 59.7 % (ref 43.0–77.0)
Platelets: 201 10*3/uL (ref 150.0–400.0)
RBC: 5.01 Mil/uL (ref 4.22–5.81)
RDW: 14.4 % (ref 11.5–15.5)
WBC: 5 10*3/uL (ref 4.0–10.5)

## 2024-04-20 LAB — VITAMIN D 25 HYDROXY (VIT D DEFICIENCY, FRACTURES): VITD: 20.62 ng/mL — ABNORMAL LOW (ref 30.00–100.00)

## 2024-04-20 LAB — LIPID PANEL
Cholesterol: 112 mg/dL (ref 0–200)
HDL: 30.8 mg/dL — ABNORMAL LOW (ref >39.00)
LDL Cholesterol: 68 mg/dL (ref 0–99)
NonHDL: 81.12
Total CHOL/HDL Ratio: 4
Triglycerides: 67 mg/dL (ref 0.0–149.0)
VLDL: 13.4 mg/dL (ref 0.0–40.0)

## 2024-04-20 LAB — HEMOGLOBIN A1C: Hgb A1c MFr Bld: 6.8 % — ABNORMAL HIGH (ref 4.6–6.5)

## 2024-04-20 NOTE — Assessment & Plan Note (Addendum)
 Chronic Likely related to OA Deferred x-rays at this time Discussed that his osteoarthritis is a progressive disease and we will get worse with time Stressed weight loss Walks a lot at work Taking Tylenol as needed and that is helping Can you topical medications

## 2024-04-20 NOTE — Assessment & Plan Note (Signed)
 Chronic Intermittent flares controlled Continue topical steroid creams or Protopic  as needed

## 2024-04-20 NOTE — Assessment & Plan Note (Signed)
 Chronic Blood pressure well controlled Continue amlodipine  10 mg daily, HCTZ 25 mg daily, lisinopril  40 mg daily CMP, CBC

## 2024-04-20 NOTE — Assessment & Plan Note (Signed)
Chronic Regular exercise and healthy diet encouraged Check lipid panel, CMP Continue atorvastatin 20 mg daily 

## 2024-04-20 NOTE — Assessment & Plan Note (Addendum)
 Chronic  Lab Results  Component Value Date   HGBA1C 6.6 (H) 10/21/2023   Sugars controlled No longer taking Rybelsus -had side effects ( does not recall the symptoms) so he stopped it Check A1c Continue Jardiance  10 mg daily, metformin  500 mg twice daily, Continue weight loss efforts Decrease portions-diet low in sugar/carbs He is walking a lot at work-14,000 steps-continue good activity

## 2024-04-20 NOTE — Assessment & Plan Note (Addendum)
 Chronic Comorbidities hypertension, diabetes, hyperlipidemia Encouraged weight loss-discussed concerns with increasing his knee arthritis and impact on organs-increasing the risk of fatty liver/liver cirrhosis He is walking a lot at work

## 2024-04-22 ENCOUNTER — Encounter: Payer: Self-pay | Admitting: Internal Medicine

## 2024-05-03 ENCOUNTER — Encounter: Payer: Self-pay | Admitting: Gastroenterology

## 2024-07-28 ENCOUNTER — Other Ambulatory Visit: Payer: Self-pay | Admitting: Internal Medicine

## 2024-07-28 DIAGNOSIS — E119 Type 2 diabetes mellitus without complications: Secondary | ICD-10-CM

## 2024-09-08 ENCOUNTER — Other Ambulatory Visit: Payer: Self-pay | Admitting: Internal Medicine

## 2024-09-08 DIAGNOSIS — I10 Essential (primary) hypertension: Secondary | ICD-10-CM

## 2024-10-05 DIAGNOSIS — E119 Type 2 diabetes mellitus without complications: Secondary | ICD-10-CM | POA: Diagnosis not present

## 2024-10-18 NOTE — Patient Instructions (Addendum)
    Flu immunization administered today.      Blood work was ordered.       Medications changes include :   None     Return in about 6 months (around 04/19/2025) for Physical Exam.

## 2024-10-18 NOTE — Progress Notes (Unsigned)
 Subjective:    Patient ID: Chris Watkins, male    DOB: 1971-02-02, 53 y.o.   MRN: [id number]     HPI [name] is here for follow up of his chronic medical problems.  No concerns.   Yard work - no other exercise  Medications and allergies reviewed with patient and updated if appropriate.  Current Outpatient Medications on File Prior to Visit  Medication Sig Dispense Refill   amLODipine  (NORVASC ) 10 MG tablet TAKE 1 TABLET BY MOUTH EVERY DAY 90 tablet 3   atorvastatin  (LIPITOR) 20 MG tablet TAKE 1 TABLET BY MOUTH EVERY DAY 90 tablet 2   clotrimazole -betamethasone  (LOTRISONE ) cream APPLY 1 APPLICATION TOPICALLY DAILY 45 g 5   glucose blood (FREESTYLE LITE) test strip Use to check blood sugars twice a day. Dx E11.9 100 each 3   hydrochlorothiazide  (HYDRODIURIL ) 25 MG tablet TAKE 1 TABLET (25 MG TOTAL) BY MOUTH DAILY. 90 tablet 1   JARDIANCE  10 MG TABS tablet TAKE 1 TABLET BY MOUTH EVERY DAY 90 tablet 2   Lancet Devices (ONE TOUCH DELICA LANCING DEV) MISC UAD to check sugars.  E11.9 1 each 2   Lancets (FREESTYLE) lancets Use as directed to check sugars daily   E11.9 100 each 12   lisinopril  (ZESTRIL ) 40 MG tablet TAKE 1 TABLET BY MOUTH EVERY DAY 90 tablet 3   metFORMIN  (GLUCOPHAGE -XR) 500 MG 24 hr tablet TAKE 1 TABLET (500 MG TOTAL) BY MOUTH IN THE MORNING AND AT BEDTIME 180 tablet 2   PEG 3350-KCl-NaCl-NaSulf-MgSul (SUFLAVE ) 178.7 g SOLR Take 1 kit by mouth as directed. 1 each 0   Semaglutide  (RYBELSUS ) 14 MG TABS Take 1 tablet (14 mg total) by mouth daily. 30 tablet 5   No current facility-administered medications on file prior to visit.     Review of Systems  Constitutional:  Negative for fever.  Respiratory:  Negative for cough, shortness of breath and wheezing.   Cardiovascular:  Negative for chest pain, palpitations and leg swelling.  Neurological:  Negative for light-headedness and headaches.       Objective:   Vitals:   10/19/24 1454  BP: 126/66  Pulse: 64   Temp: 98.2 F (36.8 C)  SpO2: 98%   BP Readings from Last 3 Encounters:  10/19/24 126/66  04/20/24 118/74  02/24/24 127/78   Wt Readings from Last 3 Encounters:  10/19/24 247 lb (112 kg)  04/20/24 247 lb (112 kg)  02/24/24 240 lb (108.9 kg)   Body mass index is 34.45 kg/m.    Physical Exam Constitutional:      General: He is not in acute distress.    Appearance: Normal appearance. He is not ill-appearing.  HENT:     Head: Normocephalic and atraumatic.  Eyes:     Conjunctiva/sclera: Conjunctivae normal.  Cardiovascular:     Rate and Rhythm: Normal rate and regular rhythm.     Heart sounds: Normal heart sounds.  Pulmonary:     Effort: Pulmonary effort is normal. No respiratory distress.     Breath sounds: Normal breath sounds. No wheezing or rales.  Musculoskeletal:     Right lower leg: No edema.     Left lower leg: No edema.  Skin:    General: Skin is warm and dry.     Findings: No rash.  Neurological:     Mental Status: He is alert. Mental status is at baseline.  Psychiatric:        Mood and Affect: Mood normal.  Lab Results  Component Value Date   WBC 5.0 04/20/2024   HGB 14.9 04/20/2024   HCT 45.3 04/20/2024   PLT 201.0 04/20/2024   GLUCOSE 106 (H) 04/20/2024   CHOL 112 04/20/2024   TRIG 67.0 04/20/2024   HDL 30.80 (L) 04/20/2024   LDLCALC 68 04/20/2024   ALT 19 04/20/2024   AST 21 04/20/2024   NA 137 04/20/2024   K 4.3 04/20/2024   CL 100 04/20/2024   CREATININE 0.77 04/20/2024   BUN 14 04/20/2024   CO2 29 04/20/2024   TSH 1.45 04/15/2023   PSA 0.75 10/21/2023   HGBA1C 6.8 (H) 04/20/2024   MICROALBUR 0.5 01/27/2015     Assessment & Plan:    See Problem List for Assessment and Plan of chronic medical problems.

## 2024-10-19 ENCOUNTER — Ambulatory Visit: Admitting: Internal Medicine

## 2024-10-19 ENCOUNTER — Encounter: Payer: Self-pay | Admitting: Internal Medicine

## 2024-10-19 VITALS — BP 126/66 | HR 64 | Temp 98.2°F | Ht 71.0 in | Wt 247.0 lb

## 2024-10-19 DIAGNOSIS — L309 Dermatitis, unspecified: Secondary | ICD-10-CM | POA: Diagnosis not present

## 2024-10-19 DIAGNOSIS — M25562 Pain in left knee: Secondary | ICD-10-CM

## 2024-10-19 DIAGNOSIS — E559 Vitamin D deficiency, unspecified: Secondary | ICD-10-CM

## 2024-10-19 DIAGNOSIS — G8929 Other chronic pain: Secondary | ICD-10-CM | POA: Diagnosis not present

## 2024-10-19 DIAGNOSIS — E1169 Type 2 diabetes mellitus with other specified complication: Secondary | ICD-10-CM | POA: Diagnosis not present

## 2024-10-19 DIAGNOSIS — Z23 Encounter for immunization: Secondary | ICD-10-CM

## 2024-10-19 DIAGNOSIS — M25561 Pain in right knee: Secondary | ICD-10-CM | POA: Diagnosis not present

## 2024-10-19 DIAGNOSIS — E66811 Obesity, class 1: Secondary | ICD-10-CM

## 2024-10-19 DIAGNOSIS — I152 Hypertension secondary to endocrine disorders: Secondary | ICD-10-CM

## 2024-10-19 DIAGNOSIS — Z125 Encounter for screening for malignant neoplasm of prostate: Secondary | ICD-10-CM | POA: Diagnosis not present

## 2024-10-19 DIAGNOSIS — E1159 Type 2 diabetes mellitus with other circulatory complications: Secondary | ICD-10-CM | POA: Diagnosis not present

## 2024-10-19 DIAGNOSIS — E785 Hyperlipidemia, unspecified: Secondary | ICD-10-CM | POA: Diagnosis not present

## 2024-10-19 LAB — COMPREHENSIVE METABOLIC PANEL WITH GFR
ALT: 18 U/L (ref 0–53)
AST: 16 U/L (ref 0–37)
Albumin: 4.4 g/dL (ref 3.5–5.2)
Alkaline Phosphatase: 96 U/L (ref 39–117)
BUN: 16 mg/dL (ref 6–23)
CO2: 25 meq/L (ref 19–32)
Calcium: 9.3 mg/dL (ref 8.4–10.5)
Chloride: 98 meq/L (ref 96–112)
Creatinine, Ser: 0.75 mg/dL (ref 0.40–1.50)
GFR: 102.57 mL/min (ref >60.00)
Glucose, Bld: 108 mg/dL — ABNORMAL HIGH (ref 70–99)
Potassium: 3.7 meq/L (ref 3.5–5.1)
Sodium: 135 meq/L (ref 135–145)
Total Bilirubin: 0.7 mg/dL (ref 0.2–1.2)
Total Protein: 7.4 g/dL (ref 6.0–8.3)

## 2024-10-19 LAB — MICROALBUMIN / CREATININE URINE RATIO
Creatinine,U: 77 mg/dL
Microalb Creat Ratio: UNDETERMINED mg/g (ref 0.0–30.0)
Microalb, Ur: <0.7 mg/dL

## 2024-10-19 LAB — LIPID PANEL
Cholesterol: 104 mg/dL (ref 0–200)
HDL: 26.1 mg/dL — ABNORMAL LOW (ref >39.00)
LDL Cholesterol: 52 mg/dL (ref 0–99)
NonHDL: 77.41
Total CHOL/HDL Ratio: 4
Triglycerides: 128 mg/dL (ref 0.0–149.0)
VLDL: 25.6 mg/dL (ref 0.0–40.0)

## 2024-10-19 LAB — VITAMIN D 25 HYDROXY (VIT D DEFICIENCY, FRACTURES): VITD: 35.08 ng/mL (ref 30.00–100.00)

## 2024-10-19 LAB — HEMOGLOBIN A1C: Hgb A1c MFr Bld: 6.8 % — ABNORMAL HIGH (ref 4.6–6.5)

## 2024-10-19 LAB — PSA, MEDICARE: PSA: 0.62 ng/mL (ref 0.10–4.00)

## 2024-10-19 MED ORDER — TRIAMCINOLONE ACETONIDE 0.1 % EX CREA: 1.0000 | TOPICAL_CREAM | Freq: Two times a day (BID) | CUTANEOUS | 2 refills | Status: DC

## 2024-10-19 NOTE — Assessment & Plan Note (Signed)
 Chronic Associated with hyperlipidemia  Lab Results  Component Value Date   HGBA1C 6.8 (H) 04/20/2024   Sugars controlled No longer taking Rybelsus -had side effects ( does not recall the symptoms) so he stopped it Check A1c, urine albumin/creatinine ratio Continue Jardiance  10 mg daily, metformin  500 mg twice daily, Continue weight loss efforts Decrease portions-diet low in sugar/carbs He is walking a lot at work-14,000 steps-continue good activity

## 2024-10-19 NOTE — Assessment & Plan Note (Signed)
 Chronic Likely related to OA Encouraged exercise Walks a lot at work Taking Tylenol as needed and that is helping Can you topical medications

## 2024-10-19 NOTE — Assessment & Plan Note (Signed)
Chronic Regular exercise and healthy diet encouraged Check lipid panel, CMP Continue atorvastatin 20 mg daily 

## 2024-10-19 NOTE — Assessment & Plan Note (Signed)
 Chronic Taking vitamin D daily Check vitamin D level

## 2024-10-19 NOTE — Assessment & Plan Note (Signed)
 Chronic Comorbidities hypertension, diabetes, hyperlipidemia Encouraged weight loss He is walking a lot at work, healthy diet, smaller portions

## 2024-10-19 NOTE — Assessment & Plan Note (Signed)
 Chronic Blood pressure well controlled Continue amlodipine  10 mg daily, HCTZ 25 mg daily, lisinopril  40 mg daily CMP, CBC

## 2024-10-19 NOTE — Assessment & Plan Note (Signed)
 Chronic Intermittent flares controlled Continue topical steroid creams or Protopic  as needed

## 2024-10-21 ENCOUNTER — Ambulatory Visit: Payer: Self-pay | Admitting: Internal Medicine

## 2024-10-24 ENCOUNTER — Other Ambulatory Visit: Payer: Self-pay | Admitting: Internal Medicine

## 2024-10-24 ENCOUNTER — Encounter: Payer: Self-pay | Admitting: Internal Medicine

## 2024-10-24 ENCOUNTER — Ambulatory Visit
Admission: EM | Admit: 2024-10-24 | Discharge: 2024-10-24 | Disposition: A | Discharge status: Home / Self Care | Attending: Family Medicine | Admitting: Family Medicine

## 2024-10-24 DIAGNOSIS — J069 Acute upper respiratory infection, unspecified: Secondary | ICD-10-CM | POA: Diagnosis not present

## 2024-10-24 DIAGNOSIS — F17200 Nicotine dependence, unspecified, uncomplicated: Secondary | ICD-10-CM | POA: Insufficient documentation

## 2024-10-24 DIAGNOSIS — E119 Type 2 diabetes mellitus without complications: Secondary | ICD-10-CM

## 2024-10-24 DIAGNOSIS — I10 Essential (primary) hypertension: Secondary | ICD-10-CM | POA: Insufficient documentation

## 2024-10-24 DIAGNOSIS — E669 Obesity, unspecified: Secondary | ICD-10-CM | POA: Insufficient documentation

## 2024-10-24 MED ORDER — FLUTICASONE PROPIONATE 50 MCG/ACT NA SUSP: 2.0000 | Freq: Every day | NASAL | 2 refills | Status: AC

## 2024-10-24 NOTE — Discharge Instructions (Addendum)
-   Use Flonase  (2 sprays) in each nostril twice daily for 7 to 14 days - If your symptoms do not improve by next Wednesday, I recommend returning to care as you may need an antibiotic for sinus infection

## 2024-10-24 NOTE — ED Provider Notes (Signed)
 [facility]    CSN: [id number] Arrival date & time: 10/24/24  0857      History   Chief Complaint Chief Complaint  Patient presents with   Cough    HPI Chris Watkins is a 53 y.o. male.    Cough 4 days of cough, congestion, sore throat, sinus pressure.  No fevers, shortness of breath, nausea or vomiting. Taking OTC Mucinex . Adherent to home blood pressure and diabetes medications.  Past Medical History:  Diagnosis Date   Chicken pox    Diabetes mellitus without complication (HCC)    Hyperlipidemia    Hypertension    Sleep apnea    wears CPAP    Patient Active Problem List   Diagnosis Date Noted   Obesity (BMI 30-39.9) 10/24/2024   Hypertension 10/24/2024   Tobacco dependence syndrome 10/24/2024   Vitamin D  deficiency 04/20/2024   Chronic pain of both knees 05/10/2023   Obesity (BMI 30.0-34.9) 04/15/2023   Plantar fasciitis 10/02/2021   Hyperlipidemia associated with type 2 diabetes mellitus (HCC) 10/01/2021   Low back pain 11/29/2019   Eczema 06/16/2018   Erectile dysfunction 02/25/2018   Type 2 diabetes mellitus with hyperlipidemia (HCC) 04/23/2015   Hypertension associated with type 2 diabetes mellitus (HCC) 04/09/2014    Past Surgical History:  Procedure Laterality Date   HERNIA REPAIR     age[age]   SLIPPED CAPITAL FEMORAL EPIPHYSIS PINNING Bilateral    in 6th grade       Home Medications    Prior to Admission medications   Medication Sig Start Date End Date Taking? Authorizing Provider  amLODipine  (NORVASC ) 10 MG tablet TAKE 1 TABLET BY MOUTH EVERY DAY 01/19/24  Yes Burns, Glade PARAS, MD  atorvastatin  (LIPITOR) 20 MG tablet TAKE 1 TABLET BY MOUTH EVERY DAY 07/30/24  Yes Burns, Glade PARAS, MD  clotrimazole -betamethasone  (LOTRISONE ) cream APPLY 1 APPLICATION TOPICALLY DAILY 04/02/24  Yes Burns, Glade PARAS, MD  fluticasone  (FLONASE ) 50 MCG/ACT nasal spray Place 2 sprays into both nostrils daily. 10/24/24  Yes Howell Lunger, DO  guaiFENesin   (MUCINEX  PO) Take by mouth.   Yes [provider]  lisinopril  (ZESTRIL ) 40 MG tablet TAKE 1 TABLET BY MOUTH EVERY DAY 01/30/24  Yes Burns, Glade PARAS, MD  Pseudoeph-Doxylamine-DM-APAP (NYQUIL PO) Take by mouth.   Yes [provider]  glucose blood (FREESTYLE LITE) test strip Use to check blood sugars twice a day. Dx E11.9 08/25/18   [name], NP  hydrochlorothiazide  (HYDRODIURIL ) 25 MG tablet TAKE 1 TABLET (25 MG TOTAL) BY MOUTH DAILY. 09/10/24   Geofm Glade PARAS, MD  JARDIANCE  10 MG TABS tablet TAKE 1 TABLET BY MOUTH EVERY DAY 10/24/24   Geofm Glade PARAS, MD  Lancet Devices (ONE TOUCH DELICA LANCING DEV) MISC UAD to check sugars.  E11.9 10/15/22   Geofm Glade PARAS, MD  Lancets (FREESTYLE) lancets Use as directed to check sugars daily   E11.9 10/07/21   Geofm Glade PARAS, MD  metFORMIN  (GLUCOPHAGE -XR) 500 MG 24 hr tablet TAKE 1 TABLET (500 MG TOTAL) BY MOUTH IN THE MORNING AND AT BEDTIME 10/24/24   Burns, Glade PARAS, MD  PEG 3350-KCl-NaCl-NaSulf-MgSul (SUFLAVE ) 178.7 g SOLR Take 1 kit by mouth as directed. 02/03/24   Nandigam, Kavitha V, MD  triamcinolone  cream (KENALOG ) 0.1 % Apply 1 Application topically 2 (two) times daily. Apply for 2 weeks. May use on face 10/19/24   Geofm Glade PARAS, MD    Family History Family History  Problem Relation Age of Onset  Thyroid  disease Mother    Hypertension Father    Diabetes Father    Diabetes Maternal Grandmother    Colon cancer Neg Hx    Esophageal cancer Neg Hx    Stomach cancer Neg Hx    Rectal cancer Neg Hx     Social History Social History   Tobacco Use   Smoking status: Former    Current packs/day: 0.00    Types: Cigarettes    Quit date: 02/27/2018    Years since quitting: 6.6   Smokeless tobacco: Never  Vaping Use   Vaping status: Never Used  Substance Use Topics   Alcohol use: Not Currently    Comment: Has not since he was diagnosed with DM in 01/2015   Drug use: No     Allergies   Patient has no known  allergies.   Review of Systems Review of Systems  Respiratory:  Positive for cough.      Physical Exam Triage Vital Signs ED Triage Vitals  Encounter Vitals Group     BP 10/24/24 0916 131/82     Girls Systolic BP Percentile --      Girls Diastolic BP Percentile --      Boys Systolic BP Percentile --      Boys Diastolic BP Percentile --      Pulse Rate 10/24/24 0916 80     Resp 10/24/24 0916 20     Temp 10/24/24 0916 98.4 F (36.9 C)     Temp Source 10/24/24 0916 Oral     SpO2 10/24/24 0916 95 %     Weight 10/24/24 0914 242 lb (109.8 kg)     Height 10/24/24 0914 5' 11 (1.803 m)     Head Circumference --      Peak Flow --      Pain Score 10/24/24 0910 0     Pain Loc --      Pain Education --      Exclude from Growth Chart --    No data found.  Updated Vital Signs BP 131/82 (BP Location: Left Arm)   Pulse 80   Temp 98.4 F (36.9 C) (Oral)   Resp 20   Ht 5' 11 (1.803 m)   Wt 242 lb (109.8 kg)   SpO2 95%   BMI 33.75 kg/m   Visual Acuity Right Eye Distance:   Left Eye Distance:   Bilateral Distance:    Right Eye Near:   Left Eye Near:    Bilateral Near:     Physical Exam Constitutional:      General: He is not in acute distress. HENT:     Head: Normocephalic and atraumatic.     Mouth/Throat:     Pharynx: Posterior oropharyngeal erythema present. No oropharyngeal exudate.  Eyes:     Extraocular Movements: Extraocular movements intact.     Conjunctiva/sclera: Conjunctivae normal.     Pupils: Pupils are equal, round, and reactive to light.  Cardiovascular:     Rate and Rhythm: Normal rate and regular rhythm.     Pulses: Normal pulses.  Pulmonary:     Effort: Pulmonary effort is normal.     Breath sounds: Normal breath sounds.  Abdominal:     General: Abdomen is flat. Bowel sounds are normal.     Palpations: Abdomen is soft.  Musculoskeletal:     Cervical back: Normal range of motion and neck supple.  Lymphadenopathy:     Cervical: Cervical  adenopathy present.  Skin:    General: Skin  is warm and dry.  Neurological:     Mental Status: He is alert.      UC Treatments / Results  Labs (all labs ordered are listed, but only abnormal results are displayed) Labs Reviewed - No data to display  EKG   Radiology No results found.  Procedures Procedures (including critical care time)  Medications Ordered in UC Medications - No data to display  Initial Impression / Assessment and Plan / UC Course  I have reviewed the triage vital signs and the nursing notes.  Pertinent labs & imaging results that were available during my care of the patient were reviewed by me and considered in my medical decision making (see chart for details).     Afebrile, ill-appearing but nontoxic 53 year old male with 4 days of viral URI symptoms. Differential: Viral URI with cough, viral sinusitis versus allergic rhinitis.  Trial 7-14 days of Flonase , OTC cold and flu-counseled to avoid decongestants 2/2 hypertension.  Follow-up and return precautions discussed.   Final Clinical Impressions(s) / UC Diagnoses   Final diagnoses:  Viral URI with cough     Discharge Instructions      - Use Flonase  (2 sprays) in each nostril twice daily for 7 to 14 days - If your symptoms do not improve by next Wednesday, I recommend returning to care as you may need an antibiotic for sinus infection     ED Prescriptions     Medication Sig Dispense Auth. Provider   fluticasone  (FLONASE ) 50 MCG/ACT nasal spray Place 2 sprays into both nostrils daily. 18.2 mL Howell Lunger, DO      PDMP not reviewed this encounter.   Howell Lunger, DO 10/24/24 1002

## 2024-10-24 NOTE — ED Triage Notes (Signed)
 Patient states that symptoms began on Saturday with sinus drainage and a slight cough by that evening. Symptoms remained similar on Sunday. By Monday, the cough had worsened and the patient was unable to clear their throat. They now report sinus pain and pressure along with a hoarse voice, noting that this pattern occurs annually. Denies fever. Patient currently has a primary care provider (PCP). Received seasonal influenza vaccine on Friday. Used Mucinex  last night and NyQuil a few nights ago to help with rest.

## 2024-10-26 ENCOUNTER — Ambulatory Visit: Admitting: Family Medicine

## 2024-10-26 ENCOUNTER — Encounter: Payer: Self-pay | Admitting: Family Medicine

## 2024-10-26 VITALS — BP 140/86 | HR 87 | Temp 98.2°F | Ht 71.0 in | Wt 245.4 lb

## 2024-10-26 DIAGNOSIS — B9689 Other specified bacterial agents as the cause of diseases classified elsewhere: Secondary | ICD-10-CM | POA: Diagnosis not present

## 2024-10-26 DIAGNOSIS — R051 Acute cough: Secondary | ICD-10-CM

## 2024-10-26 DIAGNOSIS — J208 Acute bronchitis due to other specified organisms: Secondary | ICD-10-CM

## 2024-10-26 LAB — POC COVID19 BINAXNOW: SARS Coronavirus 2 Ag: NEGATIVE

## 2024-10-26 MED ORDER — PREDNISONE 20 MG PO TABS: 40.0000 mg | ORAL_TABLET | Freq: Every day | ORAL | 0 refills | Status: AC

## 2024-10-26 MED ORDER — AZITHROMYCIN 250 MG PO TABS: ORAL_TABLET | ORAL | 0 refills | Status: AC

## 2024-10-26 MED ORDER — HYDROCODONE BIT-HOMATROP MBR 5-1.5 MG/5ML PO SOLN: 5.0000 mL | Freq: Three times a day (TID) | ORAL | 0 refills | Status: AC | PRN

## 2024-10-26 NOTE — Progress Notes (Signed)
 Acute Office Visit  Subjective:     Patient ID: Chris Watkins, male    DOB: 08-28-1971, 53 y.o.   MRN: [id number]  Chief Complaint  Patient presents with   Cough    Cough    Discussed the use of AI scribe software for clinical note transcription with the patient, who gave verbal consent to proceed.  History of Present Illness Chris Watkins is a 53 year old male who presents with persistent sinus-related symptoms and dry cough.  Sinonasal symptoms - Persistent sinus drainage for approximately one week - No improvement with over-the-counter medications - No use of Flonase  during current episode - Loss of taste associated with symptoms - Typically experiences sinus issues annually around this time, lasting two to three weeks if untreated  Cough - Persistent dry cough for approximately one week - Cough worsens at night - Dry sensation when coughing - No recent use of cough syrup  Systemic and associated symptoms - No fever, chills, or gastrointestinal upset - Decreased appetite  Medication history and tolerability - No frequent use of antibiotics and does not recall taking azithromycin  - No known allergies to antibiotics - No adverse reactions to steroids - Tolerated codeine without issues     Review of Systems  Respiratory:  Positive for cough.    Per HPI      Objective:    BP (!) 140/86 (BP Location: Left Arm, Patient Position: Sitting)   Pulse 87   Temp 98.2 F (36.8 C) (Temporal)   Ht 5' 11 (1.803 m)   Wt 245 lb 6.4 oz (111.3 kg)   SpO2 94%   BMI 34.23 kg/m    Physical Exam Vitals and nursing note reviewed.  Constitutional:      General: He is not in acute distress.    Appearance: Normal appearance.  HENT:     Head: Normocephalic and atraumatic.     Right Ear: External ear normal.     Left Ear: External ear normal.     Nose: Congestion present.     Mouth/Throat:     Mouth: Mucous membranes are moist.     Pharynx: Posterior  oropharyngeal erythema present.     Comments: Oropharyngeal cobblestoning   Eyes:     Extraocular Movements: Extraocular movements intact.  Cardiovascular:     Rate and Rhythm: Normal rate and regular rhythm.     Pulses: Normal pulses.     Heart sounds: Normal heart sounds.  Pulmonary:     Effort: Pulmonary effort is normal. No respiratory distress.     Breath sounds: Normal breath sounds. No wheezing, rhonchi or rales.     Comments: Persistent dry cough, decreased air movement to bilateral lower lobes Musculoskeletal:        General: Normal range of motion.     Cervical back: Normal range of motion.     Right lower leg: No edema.     Left lower leg: No edema.  Lymphadenopathy:     Cervical: Cervical adenopathy present.  Skin:    General: Skin is warm and dry.  Neurological:     General: No focal deficit present.     Mental Status: He is alert and oriented to person, place, and time.  Psychiatric:        Mood and Affect: Mood normal.        Behavior: Behavior normal.     No results found for any visits on 10/26/24.      Assessment & Plan:  Assessment and Plan Assessment & Plan Acute bacterial bronchitis with acute cough Acute bronchitis with dry, persistent cough. No fever or wheezing. Negative COVID test. Likely sinus-related. - Prescribed azithromycin : two tablets today, then one daily for four days. - Prescribed prednisone : two tablets daily for five days. - Prescribed cough syrup with codeine as needed, especially at night. - Discussed prednisone  side effects: potential slight increases in blood pressure and blood sugar, resolving in two weeks. - Sent medications to [facility].     Orders Placed This Encounter  Procedures   POC COVID-19 BinaxNow     Meds ordered this encounter  Medications   predniSONE  (DELTASONE ) 20 MG tablet    Sig: Take 2 tablets (40 mg total) by mouth daily for 5 days.    Dispense:  10 tablet    Refill:  0    azithromycin  (ZITHROMAX ) 250 MG tablet    Sig: Take 2 tablets on day 1, then 1 tablet daily on days 2 through 5    Dispense:  6 tablet    Refill:  0   HYDROcodone bit-homatropine (HYCODAN) 5-1.5 MG/5ML syrup    Sig: Take 5 mLs by mouth every 8 (eight) hours as needed for cough.    Dispense:  120 mL    Refill:  0    Return if symptoms worsen or fail to improve.  Corean LITTIE Ku, FNP

## 2024-10-26 NOTE — Patient Instructions (Addendum)
 I have sent in azithromycin  for you to take.  Take 2 tablets today, then 1 tablet daily for the next 4 days.  I have sent in prednisone  for you to take 2 tablets once daily in the morning with breakfast for the next 5 days.  I have sent in hydrocodone cough syrup for you to take 5 mL once daily in the evening as needed for cough.  This medication may make you sleepy.  Do not drive or operate heavy machinery while taking this medication.  Follow-up with me for new or worsening symptoms.

## 2024-10-28 ENCOUNTER — Ambulatory Visit: Payer: Self-pay | Admitting: Family Medicine

## 2025-01-01 ENCOUNTER — Encounter: Payer: Self-pay | Admitting: Internal Medicine

## 2025-01-02 ENCOUNTER — Other Ambulatory Visit: Payer: Self-pay

## 2025-01-02 DIAGNOSIS — L309 Dermatitis, unspecified: Secondary | ICD-10-CM

## 2025-01-02 MED ORDER — TRIAMCINOLONE ACETONIDE 0.1 % EX CREA: 1.0000 | TOPICAL_CREAM | Freq: Two times a day (BID) | CUTANEOUS | 2 refills | Status: AC

## 2025-01-04 ENCOUNTER — Other Ambulatory Visit: Payer: Self-pay

## 2025-01-04 MED ORDER — TACROLIMUS 0.1 % EX OINT: TOPICAL_OINTMENT | Freq: Two times a day (BID) | CUTANEOUS | 2 refills | Status: AC
